# Patient Record
Sex: Male | Born: 1945 | Race: White | Hispanic: No | Marital: Married | Smoking: Never smoker
Health system: Southern US, Community
[De-identification: ages and names within clinical notes are randomized; demographics above are authoritative.]

## PROBLEM LIST (undated history)

## (undated) DIAGNOSIS — F419 Anxiety disorder, unspecified: Secondary | ICD-10-CM

## (undated) DIAGNOSIS — C439 Malignant melanoma of skin, unspecified: Secondary | ICD-10-CM

## (undated) DIAGNOSIS — M1990 Unspecified osteoarthritis, unspecified site: Secondary | ICD-10-CM

## (undated) DIAGNOSIS — E039 Hypothyroidism, unspecified: Secondary | ICD-10-CM

## (undated) DIAGNOSIS — M5136 Other intervertebral disc degeneration, lumbar region: Secondary | ICD-10-CM

## (undated) DIAGNOSIS — M51369 Other intervertebral disc degeneration, lumbar region without mention of lumbar back pain or lower extremity pain: Secondary | ICD-10-CM

## (undated) DIAGNOSIS — I10 Essential (primary) hypertension: Secondary | ICD-10-CM

## (undated) HISTORY — PX: FRACTURE SURGERY: SHX138

## (undated) HISTORY — PX: CHOLECYSTECTOMY: SHX55

## (undated) HISTORY — PX: SPINE SURGERY: SHX786

## (undated) HISTORY — DX: Anxiety disorder, unspecified: F41.9

## (undated) HISTORY — DX: Other intervertebral disc degeneration, lumbar region without mention of lumbar back pain or lower extremity pain: M51.369

## (undated) HISTORY — DX: Unspecified osteoarthritis, unspecified site: M19.90

## (undated) HISTORY — DX: Essential (primary) hypertension: I10

## (undated) HISTORY — DX: Hypothyroidism, unspecified: E03.9

## (undated) HISTORY — DX: Other intervertebral disc degeneration, lumbar region: M51.36

---
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
## 1999-03-18 ENCOUNTER — Encounter: Payer: Self-pay | Admitting: General Surgery

## 1999-03-18 ENCOUNTER — Ambulatory Visit (HOSPITAL_COMMUNITY): Admission: RE | Admit: 1999-03-18 | Discharge: 1999-03-18 | Payer: Self-pay | Admitting: General Surgery

## 1999-08-18 ENCOUNTER — Encounter: Payer: Self-pay | Admitting: General Surgery

## 1999-08-18 ENCOUNTER — Observation Stay (HOSPITAL_COMMUNITY): Admission: RE | Admit: 1999-08-18 | Discharge: 1999-08-19 | Payer: Self-pay | Admitting: General Surgery

## 1999-08-26 ENCOUNTER — Inpatient Hospital Stay (HOSPITAL_COMMUNITY): Admission: EM | Admit: 1999-08-26 | Discharge: 1999-08-28 | Payer: Self-pay

## 1999-08-29 ENCOUNTER — Encounter: Payer: Self-pay | Admitting: Internal Medicine

## 1999-08-29 ENCOUNTER — Emergency Department (HOSPITAL_COMMUNITY): Admission: EM | Admit: 1999-08-29 | Discharge: 1999-08-29 | Payer: Self-pay | Admitting: *Deleted

## 2000-01-31 ENCOUNTER — Ambulatory Visit (HOSPITAL_COMMUNITY): Admission: RE | Admit: 2000-01-31 | Discharge: 2000-01-31 | Payer: Self-pay | Admitting: Gastroenterology

## 2000-01-31 ENCOUNTER — Encounter: Payer: Self-pay | Admitting: Gastroenterology

## 2000-08-13 ENCOUNTER — Encounter: Payer: Self-pay | Admitting: Gastroenterology

## 2000-08-13 ENCOUNTER — Observation Stay (HOSPITAL_COMMUNITY): Admission: EM | Admit: 2000-08-13 | Discharge: 2000-08-14 | Payer: Self-pay | Admitting: Emergency Medicine

## 2001-11-26 ENCOUNTER — Emergency Department (HOSPITAL_COMMUNITY): Admission: EM | Admit: 2001-11-26 | Discharge: 2001-11-26 | Payer: Self-pay | Admitting: Emergency Medicine

## 2001-11-29 ENCOUNTER — Encounter: Admission: RE | Admit: 2001-11-29 | Discharge: 2001-11-29 | Payer: Self-pay | Admitting: Gastroenterology

## 2001-11-29 ENCOUNTER — Encounter: Payer: Self-pay | Admitting: Gastroenterology

## 2001-12-05 ENCOUNTER — Encounter: Payer: Self-pay | Admitting: Gastroenterology

## 2001-12-05 ENCOUNTER — Ambulatory Visit (HOSPITAL_COMMUNITY): Admission: RE | Admit: 2001-12-05 | Discharge: 2001-12-05 | Payer: Self-pay | Admitting: Gastroenterology

## 2001-12-17 ENCOUNTER — Encounter: Admission: RE | Admit: 2001-12-17 | Discharge: 2001-12-17 | Payer: Self-pay | Admitting: Gastroenterology

## 2001-12-17 ENCOUNTER — Encounter: Payer: Self-pay | Admitting: Gastroenterology

## 2001-12-24 ENCOUNTER — Encounter: Payer: Self-pay | Admitting: Gastroenterology

## 2001-12-24 ENCOUNTER — Encounter: Admission: RE | Admit: 2001-12-24 | Discharge: 2001-12-24 | Payer: Self-pay | Admitting: Gastroenterology

## 2001-12-29 ENCOUNTER — Emergency Department (HOSPITAL_COMMUNITY): Admission: EM | Admit: 2001-12-29 | Discharge: 2001-12-29 | Payer: Self-pay | Admitting: Emergency Medicine

## 2002-01-01 ENCOUNTER — Encounter: Admission: RE | Admit: 2002-01-01 | Discharge: 2002-01-01 | Payer: Self-pay | Admitting: Gastroenterology

## 2002-01-01 ENCOUNTER — Encounter: Payer: Self-pay | Admitting: Gastroenterology

## 2002-01-14 ENCOUNTER — Encounter: Admission: RE | Admit: 2002-01-14 | Discharge: 2002-01-14 | Payer: Self-pay | Admitting: Internal Medicine

## 2002-02-04 ENCOUNTER — Encounter: Admission: RE | Admit: 2002-02-04 | Discharge: 2002-02-04 | Payer: Self-pay | Admitting: Internal Medicine

## 2002-07-11 ENCOUNTER — Encounter: Admission: RE | Admit: 2002-07-11 | Discharge: 2002-10-09 | Payer: Self-pay

## 2004-11-13 ENCOUNTER — Emergency Department (HOSPITAL_COMMUNITY): Admission: EM | Admit: 2004-11-13 | Discharge: 2004-11-14 | Payer: Self-pay | Admitting: Emergency Medicine

## 2004-11-29 ENCOUNTER — Encounter: Admission: RE | Admit: 2004-11-29 | Discharge: 2004-11-29 | Payer: Self-pay | Admitting: Gastroenterology

## 2004-12-24 ENCOUNTER — Emergency Department (HOSPITAL_COMMUNITY): Admission: EM | Admit: 2004-12-24 | Discharge: 2004-12-24 | Payer: Self-pay | Admitting: Emergency Medicine

## 2004-12-26 ENCOUNTER — Inpatient Hospital Stay (HOSPITAL_COMMUNITY): Admission: AD | Admit: 2004-12-26 | Discharge: 2004-12-31 | Payer: Self-pay | Admitting: Gastroenterology

## 2005-01-20 ENCOUNTER — Ambulatory Visit: Payer: Self-pay | Admitting: Family Medicine

## 2005-01-27 ENCOUNTER — Ambulatory Visit: Payer: Self-pay | Admitting: Family Medicine

## 2005-05-04 ENCOUNTER — Encounter: Admission: RE | Admit: 2005-05-04 | Discharge: 2005-05-04 | Payer: Self-pay | Admitting: Gastroenterology

## 2005-06-29 ENCOUNTER — Emergency Department (HOSPITAL_COMMUNITY): Admission: EM | Admit: 2005-06-29 | Discharge: 2005-06-30 | Payer: Self-pay | Admitting: Emergency Medicine

## 2005-07-03 ENCOUNTER — Ambulatory Visit: Payer: Self-pay | Admitting: Gastroenterology

## 2005-07-06 ENCOUNTER — Ambulatory Visit (HOSPITAL_COMMUNITY): Admission: RE | Admit: 2005-07-06 | Discharge: 2005-07-06 | Payer: Self-pay | Admitting: Gastroenterology

## 2005-07-06 ENCOUNTER — Ambulatory Visit: Payer: Self-pay | Admitting: Gastroenterology

## 2005-07-11 ENCOUNTER — Ambulatory Visit: Payer: Self-pay | Admitting: Family Medicine

## 2005-09-13 ENCOUNTER — Ambulatory Visit: Payer: Self-pay | Admitting: Family Medicine

## 2005-11-09 ENCOUNTER — Ambulatory Visit: Payer: Self-pay | Admitting: Family Medicine

## 2006-02-11 ENCOUNTER — Encounter: Payer: Self-pay | Admitting: Gastroenterology

## 2006-08-07 ENCOUNTER — Encounter: Payer: Self-pay | Admitting: Gastroenterology

## 2006-08-07 HISTORY — PX: COLON SURGERY: SHX602

## 2006-12-28 ENCOUNTER — Telehealth: Payer: Self-pay | Admitting: Family Medicine

## 2007-01-11 ENCOUNTER — Encounter: Admission: RE | Admit: 2007-01-11 | Discharge: 2007-01-11 | Payer: Self-pay | Admitting: Orthopedic Surgery

## 2007-06-13 ENCOUNTER — Telehealth: Payer: Self-pay | Admitting: Family Medicine

## 2007-07-15 ENCOUNTER — Telehealth: Payer: Self-pay | Admitting: Family Medicine

## 2007-07-19 ENCOUNTER — Ambulatory Visit: Payer: Self-pay | Admitting: Family Medicine

## 2007-07-19 DIAGNOSIS — F329 Major depressive disorder, single episode, unspecified: Secondary | ICD-10-CM | POA: Insufficient documentation

## 2007-07-19 DIAGNOSIS — I10 Essential (primary) hypertension: Secondary | ICD-10-CM | POA: Insufficient documentation

## 2007-07-19 DIAGNOSIS — Z8719 Personal history of other diseases of the digestive system: Secondary | ICD-10-CM | POA: Insufficient documentation

## 2007-07-22 ENCOUNTER — Telehealth: Payer: Self-pay | Admitting: Family Medicine

## 2007-07-24 ENCOUNTER — Telehealth: Payer: Self-pay | Admitting: Family Medicine

## 2009-06-07 ENCOUNTER — Telehealth: Payer: Self-pay | Admitting: Gastroenterology

## 2009-06-07 ENCOUNTER — Ambulatory Visit: Payer: Self-pay | Admitting: Gastroenterology

## 2009-06-07 DIAGNOSIS — R109 Unspecified abdominal pain: Secondary | ICD-10-CM | POA: Insufficient documentation

## 2009-06-07 DIAGNOSIS — R1110 Vomiting, unspecified: Secondary | ICD-10-CM | POA: Insufficient documentation

## 2009-06-07 LAB — CONVERTED CEMR LAB
ALT: 19 units/L (ref 0–53)
AST: 14 units/L (ref 0–37)
Albumin: 4.4 g/dL (ref 3.5–5.2)
Alkaline Phosphatase: 73 units/L (ref 39–117)
BUN: 10 mg/dL (ref 6–23)
Basophils Absolute: 0 10*3/uL (ref 0.0–0.1)
Basophils Relative: 0.3 % (ref 0.0–3.0)
Bilirubin, Direct: 0.2 mg/dL (ref 0.0–0.3)
CO2: 28 meq/L (ref 19–32)
Calcium: 9.7 mg/dL (ref 8.4–10.5)
Chloride: 103 meq/L (ref 96–112)
Creatinine, Ser: 1.3 mg/dL (ref 0.4–1.5)
Eosinophils Absolute: 0 10*3/uL (ref 0.0–0.7)
Eosinophils Relative: 0.6 % (ref 0.0–5.0)
GFR calc non Af Amer: 59.43 mL/min (ref >60)
Glucose, Bld: 99 mg/dL (ref 70–99)
HCT: 45 % (ref 39.0–52.0)
Hemoglobin: 14.6 g/dL (ref 13.0–17.0)
Lymphocytes Relative: 21 % (ref 12.0–46.0)
Lymphs Abs: 1.2 10*3/uL (ref 0.7–4.0)
MCHC: 32.4 g/dL (ref 30.0–36.0)
MCV: 78.7 fL (ref 78.0–100.0)
Monocytes Absolute: 0.6 10*3/uL (ref 0.1–1.0)
Monocytes Relative: 9.9 % (ref 3.0–12.0)
Neutro Abs: 4 10*3/uL (ref 1.4–7.7)
Neutrophils Relative %: 68.2 % (ref 43.0–77.0)
Platelets: 305 10*3/uL (ref 150.0–400.0)
Potassium: 4 meq/L (ref 3.5–5.1)
RBC: 5.71 M/uL (ref 4.22–5.81)
RDW: 17.6 % — ABNORMAL HIGH (ref 11.5–14.6)
Sodium: 137 meq/L (ref 135–145)
Total Bilirubin: 1.8 mg/dL — ABNORMAL HIGH (ref 0.3–1.2)
Total Protein: 8 g/dL (ref 6.0–8.3)
WBC: 5.8 10*3/uL (ref 4.5–10.5)

## 2009-06-10 ENCOUNTER — Ambulatory Visit: Payer: Self-pay | Admitting: Cardiology

## 2009-06-11 ENCOUNTER — Encounter: Payer: Self-pay | Admitting: Gastroenterology

## 2009-06-11 DIAGNOSIS — R933 Abnormal findings on diagnostic imaging of other parts of digestive tract: Secondary | ICD-10-CM | POA: Insufficient documentation

## 2009-06-16 ENCOUNTER — Encounter: Payer: Self-pay | Admitting: Gastroenterology

## 2009-06-16 ENCOUNTER — Encounter: Payer: Self-pay | Admitting: Internal Medicine

## 2009-06-16 ENCOUNTER — Ambulatory Visit: Payer: Self-pay | Admitting: Gastroenterology

## 2009-06-16 ENCOUNTER — Telehealth: Payer: Self-pay | Admitting: Gastroenterology

## 2009-06-30 ENCOUNTER — Telehealth (INDEPENDENT_AMBULATORY_CARE_PROVIDER_SITE_OTHER): Payer: Self-pay | Admitting: *Deleted

## 2009-07-16 ENCOUNTER — Ambulatory Visit: Payer: Self-pay | Admitting: Gastroenterology

## 2009-07-16 ENCOUNTER — Encounter (INDEPENDENT_AMBULATORY_CARE_PROVIDER_SITE_OTHER): Payer: Self-pay | Admitting: *Deleted

## 2009-07-16 DIAGNOSIS — Q279 Congenital malformation of peripheral vascular system, unspecified: Secondary | ICD-10-CM | POA: Insufficient documentation

## 2009-08-12 ENCOUNTER — Ambulatory Visit: Payer: Self-pay | Admitting: Gastroenterology

## 2009-08-12 ENCOUNTER — Ambulatory Visit (HOSPITAL_COMMUNITY): Admission: RE | Admit: 2009-08-12 | Discharge: 2009-08-12 | Payer: Self-pay | Admitting: Gastroenterology

## 2009-08-30 ENCOUNTER — Encounter: Payer: Self-pay | Admitting: Gastroenterology

## 2009-08-30 ENCOUNTER — Telehealth (INDEPENDENT_AMBULATORY_CARE_PROVIDER_SITE_OTHER): Payer: Self-pay | Admitting: *Deleted

## 2009-10-06 ENCOUNTER — Telehealth (INDEPENDENT_AMBULATORY_CARE_PROVIDER_SITE_OTHER): Payer: Self-pay | Admitting: *Deleted

## 2009-10-13 ENCOUNTER — Encounter: Payer: Self-pay | Admitting: Gastroenterology

## 2010-02-02 ENCOUNTER — Encounter: Payer: Self-pay | Admitting: Gastroenterology

## 2010-05-19 NOTE — Procedures (Signed)
Summary: Upper Endoscopy  Patient: [name] Note: All result statuses are Final unless otherwise noted.  Tests: (1) Upper Endoscopy (EGD)   EGD Upper Endoscopy       DONE     [facility]     [address] [place], [place]  [address]           ENDOSCOPY PROCEDURE REPORT           PATIENT:  Henry [name], Henry [name]  MR#:  [id number]     BIRTHDATE:  1945-08-07, 64 yrs. old  GENDER:  male           ENDOSCOPIST:  Rachael Fee, MD           PROCEDURE DATE:  08/12/2009     PROCEDURE:  enteroscopy 19147     ASA CLASS:  Class II     INDICATIONS:  abnormal finding in proximal small bowel on recent     CT and SB capsule, chronic abdominal pains           MEDICATIONS:  MAC sedation, administered by CRNA     TOPICAL ANESTHETIC:  none           DESCRIPTION OF PROCEDURE:   After the risks benefits and     alternatives of the procedure were thoroughly explained, informed     consent was obtained.  The  endoscope was introduced through the     mouth and advanced to the mid jejunum, without limitations.  The     instrument was slowly withdrawn as the mucosa was fully examined.           The upper, middle, and distal third of the esophagus were     carefully inspected and no abnormalities were noted. The z-line     was well seen at the GEJ. The endoscope was pushed into the fundus     which was normal including a retroflexed view.  The antrum,gastric     body, first and second part of the duodenum were unremarkable. The     prxoimal and mid jejunum were normal. Multiple images were taken     but could not be saved due to computer/processor malfunction.     Retroflexed views revealed no abnormalities.    The scope was then     withdrawn from the patient and the procedure completed.           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  The     capsule endoscopy was also abnormal, probably at the same site as     noted on CT.  Given the possibility of neoplasm and continued,     intermittent abdominal pains we will refer him to general surgery     to consider laparoscopy to run the small bowel.           ______________________________     Rachael Fee, MD           cc: Marga Melnick, MD           n.  eSIGNED:   Rachael Fee at 08/12/2009 12:58 PM           Henry [name], [id number]  Note: An exclamation mark (!) indicates a result that was not dispersed into the flowsheet. Document Creation Date: 08/12/2009 12:59 PM _______________________________________________________________________  (1) Order result status: Final Collection or observation date-time: 08/12/2009 12:49 Requested date-time:  Receipt date-time:  Reported date-time:  Referring Physician:   Ordering Physician: Rob Bunting [phone]) Specimen Source:  Source: Launa Grill Order Number: [id number] Lab site:   Appended Document: Upper Endoscopy patty, he needs referral to general surgery to consider laprascopy to look for small bowel lesion noted on recent CT, small bowel capsule.  Appended Document: Orders Update/[facility] records faxed to [facility] pt aware   Clinical Lists Changes  Orders: Added new Test order of [facility] ([facility]) - Signed      Appended Document: Upper Endoscopy pt is aware of the appt

## 2010-05-19 NOTE — Progress Notes (Signed)
Summary: Triage   Phone Note Call from Patient Call back at Home Phone [phone]   Caller: Patient Call For: Dr. Christella Hartigan Reason for Call: Talk to Nurse Summary of Call: Pt did as he was instructed and ate a little lunch and had a BM and the capsule came out Initial call taken by: Karna Christmas,  June 16, 2009 1:19 PM  Follow-up for Phone Call        Pt had lunch at  noon and  had a BM around 1:15.  He said it wasn't stool but he thinks it looked like bile. H e passed his capsule around 1:15PM.  I advised him to come back to the office.  Follow-up by: Joselyn Glassman,  June 16, 2009 2:52 PM  Additional Follow-up for Phone Call Additional follow up Details #1::        Pt came back to the office and returned the equipment. I advised him we will contact in in 2-3 weeks with the results. Additional Follow-up by: Joselyn Glassman,  June 16, 2009 3:54 PM

## 2010-05-19 NOTE — Assessment & Plan Note (Signed)
Review of gastrointestinal problems: 1. Perforated sigmoid diverticulitis, 2007. Treated with Hartman's pouch, temporary colostomy that was eventually taken down. 2. gallstone disease, status post early 2000 cholecystectomy and then followup ERCP for retained bile duct stone. 3. likely IBS, alternating bowel habits 4. abnormal finding proximal small bowel on CT scan 2011, confirmed on capsule endoscopy 2011 unclear etiology.    History of Present Illness Visit Type: Follow-up Visit Primary GI MD: Rob Bunting MD Primary Provider: Marga Melnick, MD  Requesting Provider: n/a Chief Complaint: discuss capsule endo  History of Present Illness:     65 year old man who was here last 1 month ago, he feels better "a little bit."  He was still very fatigued, weak and had pains in abdomen.  HE will alternate with diarrhea/constipation.    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  He tells me that depression is an issue for him.  No vomitting, has a decent appetite.  he had a CT scan and a capsule endoscopy, see those results above.           Current Medications (verified): 1)  Benazepril-Hydrochlorothiazide 10-12.5 Mg Tabs (Benazepril-Hydrochlorothiazide) .... Once Daily 2)  Vitamin B-12 1000 Mcg  Tabs (Cyanocobalamin) .... Take 1 Tablet By Mouth Once A Day 3)  Ambien 5 Mg  Tabs (Zolpidem Tartrate) .... Take 1 Tablet By Mouth At Bedtime--Needs Appt 4)  Adult Aspirin Ec Low Strength 81 Mg  Tbec (Aspirin) .... Once Daily 5)  Fish Oil Maximum Strength 1200 Mg  Caps (Omega-3 Fatty Acids) .... Once Daily 6)  Flomax 0.4 Mg Caps (Tamsulosin Hcl) .... Once Daily 7)  Lorazepam 1 Mg Tabs (Lorazepam) .... As Needed  Allergies (verified): 1)  ! Cipro  Vital Signs:  Patient profile:   65 year old male Height:      66 inches Weight:      195 pounds BMI:     31.59 BSA:     1.98 Pulse rate:   72 / minute Pulse  rhythm:   regular BP sitting:   136 / 84  (left arm) Cuff size:   regular  Vitals Entered By: Ok Anis CMA (July 16, 2009 2:36 PM)  Physical Exam  Additional Exam:  Constitutional: generally well appearing Psychiatric: alert and oriented times 3 Abdomen: soft, non-tender, non-distended, normal bowel sounds    Impression & Recommendations:  Problem # 1:  Alternating constipation, loose stools this is a very irritable bowel like symptom. I recommended he try Citrucel fiber supplements on a daily basis to try to help regulate his bowels bit more.  Problem # 2:  abnormal area on CT scan, capsule endoscopy not clear what this represents however it may be reachable by enteroscopy and so we will arrange for enteroscopy to be performed with propofol sedation at [facility] his soonest convenience.  Patient Instructions: 1)  You will be scheduled to have an enteroscopy at [facility] with propofol, the goal it to find the area noted on capsule.  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 2)  You should begin taking citrucel powder fiber supplement (orange flavor).  Start with a small spoonful and increase this over 1 week to a full, heaping spoonful daily.  You may notice some bloating when you first start the fiber, but that usually resolves after a few days. 3)  The medication list was reviewed and reconciled.  All changed / newly prescribed medications were explained.  A complete medication list was provided to the patient / caregiver.  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

## 2010-05-19 NOTE — Procedures (Signed)
Summary: 211.2/152.9  Patient here today for capsule endoscopy for Dr.Peggye Poon .  Pt verbalized understanding of all verbal and written instructions.  Pt tolerated well.  Lot #  [id number]  exp 2012-05 .  Appended Document: 211.2/152.9 capsule endo read by DR. 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 area of ulceration there, otherwise negative.  please call him, he needs rov with me in next few weeks to discuss results (SB diveriticulum causing problems?, this is possible,)   Appended Document: 211.2/152.9 pt aware appt scheduled for 07/16/09

## 2010-05-19 NOTE — Op Note (Signed)
Summary: Diverticulitis/[facility]  Diverticulitis/[facility]   Imported By: Lester Tazewell 06/24/2009 07:34:25  _____________________________________________________________________  External Attachment:    Type:   Image     Comment:   External Document

## 2010-05-19 NOTE — Progress Notes (Signed)
Summary: [facility] referral  ---- Converted from flag ---- ---- 08/30/2009 1:14 PM, Rachael Fee MD wrote: He needs referral to [facility] to consider double balloon enteroscopy for abnormal finding on CT scan, capsule endoscopy. ------------------------------  Appended Document: Orders Update/[facility]    Clinical Lists Changes  Orders: Added new Test order of [facility] - Signed

## 2010-05-19 NOTE — Progress Notes (Signed)
Summary: results questions    Phone Note Call from Patient Call back at Home Phone [phone]   Caller: Patient Call For: Dr. Christella Hartigan Reason for Call: Talk to Nurse Summary of Call: pt has questions regarding the results of "a test" pt said he had last week Initial call taken by: Vallarie Mare,  June 30, 2009 9:29 AM  Follow-up for Phone Call        answered all of pt questions and he will call with anyfurther concerns  Follow-up by: Chales Abrahams CMA Duncan Dull),  June 30, 2009 10:23 AM

## 2010-05-19 NOTE — Consult Note (Signed)
Summary: Gastroenterology/[facility]   Gastroenterology/[facility]   Imported By: Sherian Rein 11/01/2009 14:31:00  _____________________________________________________________________  External Attachment:    Type:   Image     Comment:   External Document

## 2010-05-19 NOTE — Procedures (Signed)
Summary: Instructions for procedure/[facility]  Instructions for procedure/ [facility]   Imported By: Sherian Rein 06/16/2009 08:54:15  _____________________________________________________________________  External Attachment:    Type:   Image     Comment:   External Document

## 2010-05-19 NOTE — Letter (Signed)
Summary: Gastroenterology/[facility]  Gastroenterology/[facility]   Imported By: Lester Ke[place]burg 02/14/2010 10:49:42  _____________________________________________________________________  External Attachment:    Type:   Image     Comment:   External Document

## 2010-05-19 NOTE — Procedures (Signed)
Summary: Instruction for procedure/[facility] (out pt)  Instruction for procedure/[facility] (out pt)   Imported By: Sherian Rein 07/23/2009 14:29:22  _____________________________________________________________________  External Attachment:    Type:   Image     Comment:   External Document

## 2010-05-19 NOTE — Assessment & Plan Note (Signed)
History of Present Illness Visit Type: new patient  Primary GI MD: Rob Bunting MD Primary Provider: Marga Melnick, MD  Requesting Provider: n/a Chief Complaint: abd pain, and vomiting History of Present Illness:     65 year old [name] who is here with his wife today. He has seen 4 or 5 different gastroenterologist in the community in the past 10 years.  was seeing Dr. Claudette Head, eventually found to have gallstones in 2001, he underwent cholecystectomy, this was followed by ERCP 6 days later for retained stones.  Since then he has had yearly admissions to the hospital.  he eventually chamfered care to Dr. Leary Roca who worked him up for abdominal pains  Was sent to [facility], Dr. Justin Mend, sent by Dr. Vida Rigger. He could not find anything cuasing his symptoms :which are same as now: faitgue, tired. low temperature, vomitting.  He called our office for emergency visits.  He has had abdominal pains.  These are in several locations.  He having flatus and moving bowels.  He has decreased to liquid diet.  He has problems like this for 10 years.  He suffered severe diveritulsosi, had emergency surgery.  Colostomy placed. Colostomy was reversed April 2008.  He was told by Dr. Serafina Royals that the patient may have suffered some type of damage during his cholecystectomy several years earlier.    she had a CBC, complete metabolic profile done earlier today and these were both essentially normal.           Current Medications (verified): 1)  Benazepril-Hydrochlorothiazide 10-12.5 Mg Tabs (Benazepril-Hydrochlorothiazide) .... Once Daily 2)  Vitamin B-12 1000 Mcg  Tabs (Cyanocobalamin) .... Take 1 Tablet By Mouth Once A Day 3)  Ambien 5 Mg  Tabs (Zolpidem Tartrate) .... Take 1 Tablet By Mouth At Bedtime--Needs Appt 4)  Adult Aspirin Ec Low Strength 81 Mg  Tbec (Aspirin) .... Once Daily 5)  Fish Oil Maximum Strength 1200 Mg  Caps (Omega-3 Fatty Acids) .... Once Daily 6)  Flomax 0.4 Mg Caps  (Tamsulosin Hcl) .... Once Daily  Allergies (verified): 1)  ! Cipro  Past History:  Past Medical History: Depression Diverticulitis, hx of Hypertension partial colectomy 2007 for ruptured diverticuli  cholecystectomy 2001, followed by ERCP by Dr. Yancey Flemings  Family History: Family History of Alcoholism/Addiction Family History of Arthritis Family History Hypertension   Social History: Married Never Smoked Alcohol use-yes   Review of Systems       Pertinent positive and negative review of systems were noted in the above HPI and GI specific review of systems.  All other review of systems was otherwise negative.   Vital Signs:  Patient profile:   65 year old male Height:      66 inches Weight:      189 pounds BMI:     30.62 BSA:     1.95 Temp:     97.7 degrees F oral Pulse rate:   Henry / minute Pulse rhythm:   regular BP sitting:   132 / Henry  (left arm) Cuff size:   regular  Vitals Entered By: Ok Anis CMA (June 07, 2009 3:19 PM)  Physical Exam  Additional Exam:  Constitutional: generally well appearing Psychiatric: alert and oriented times 3 Eyes: extraocular movements intact Mouth: oropharynx moist, no lesions Neck: supple, no lymphadenopathy Cardiovascular: heart regular rate and rythm Lungs: CTA bilaterally Abdomen: Long, wide midline incision well-healedsoft, mildly tender throughout abdomen, non-distended, no obvious ascites, no peritoneal signs, normal bowel sounds Extremities: no  lower extremity edema bilaterally Skin: no lesions on visible extremities    Impression & Recommendations:  Problem # 1:  long history of GI symptoms, acute abdominal pain, vomiting currently he has abdominal pain, vomiting. He did not clinically seem to have a bowel obstruction which certainly he is at risk for after his multiple abdominal surgeries. He tells me he has had complicated diverticulitis in the past and perhaps he has another bout of diverticulitis now. His  white count is normal and so that seems less likely. I would like to get surgical records from [facility] sent over. He we'll see a CAT scan with IV and oral contrast of his abdomen and pelvis. She has seen many GI providers in the area and has been dissatisfied with all of them. He was asking about empiric antibiotics currently I do not think that is a good idea for a did not know what we would be treating and givin a normal white count I think it is less likely he has any kind of serious infections.  Patient Instructions: 1)  You will be scheduled for a CT scan of abdomen and pelvis with IV and oral contrast. 2)  A copy of this information will be sent to Dr. Janace Hoard. 3)  We will get copies of surgery reports from [facility] doctor (Dr. Serafina Royals). 4)  The medication list was reviewed and reconciled.  All changed / newly prescribed medications were explained.  A complete medication list was provided to the patient / caregiver.  Appended Document: Orders Update/CT    Clinical Lists Changes  Problems: Added new problem of ABDOMINAL PAIN OTHER SPECIFIED SITE (ICD-789.09) Added new problem of VOMITING ALONE (ICD-787.03) Orders: Added new Referral order of CT Abdomen/Pelvis with Contrast (CT Abd/Pelvis w/con) - Signed

## 2010-05-19 NOTE — Letter (Signed)
Summary: EGD Instructions  [facility]  [address] [place], [place] [address]   Phone: [phone]  Fax: [phone]       VARTAN KERINS    08-16-1945    MRN: [id number]       Procedure Day /Date:08/12/09     Arrival Time: 1045 am     Procedure Time:1245 pm     Location of Procedure:                     [facility] ( Outpatient Registration)    PREPARATION FOR ENDOSCOPY   On 08/12/09  THE DAY OF THE PROCEDURE:  1.   Nothing to eat or drink is allowed after midnight the night before your procedure.     MEDICATION INSTRUCTIONS  Unless otherwise instructed, you should take regular prescription medications with a small sip of water as early as possible the morning of your procedure.            OTHER INSTRUCTIONS  You will need a responsible adult at least 63 years of age to accompany you and drive you home.   This person must remain in the waiting room during your procedure.  Wear loose fitting clothing that is easily removed.  Leave jewelry and other valuables at home.  However, you may wish to bring a book to read or an iPod/MP3 player to listen to music as you wait for your procedure to start.  Remove all body piercing jewelry and leave at home.  Total time from sign-in until discharge is approximately 2-3 hours.  You should go home directly after your procedure and rest.  You can resume normal activities the day after your procedure.  The day of your procedure you should not:   Drive   Make legal decisions   Operate machinery   Drink alcohol   Return to work  You will receive specific instructions about eating, activities and medications before you leave.    The above instructions have been reviewed and explained to me by   _______________________    I fully understand and can verbalize these instructions _____________________________ Date _________

## 2010-05-19 NOTE — Miscellaneous (Signed)
Summary: Capsule ENDO order  Clinical Lists Changes  Problems: Added new problem of NONSPECIFIC ABN FINDING RAD & OTH EXAM GI TRACT (ICD-793.4) Orders: Added new Test order of Capsule Endoscopy (Capsule Endoscopy) - Signed

## 2010-05-19 NOTE — Procedures (Signed)
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  3) Abnormal edematous fold and ? diverticulum approximately 30 minutes beyond first duodenal image, with ? area of ulceration.  4) otherwise negative study  Summary and Recommendations:  Per Dr Christella Hartigan.  Abnormal bowel may be reachable with push enteroscopy.  This report was created from the original report, which was reviewed and signed by the above listed reading physician.

## 2010-05-19 NOTE — Op Note (Signed)
Summary: Colostomy/[facility]  Colostomy/[facility]   Imported By: Lester Elwood 06/24/2009 07:31:07  _____________________________________________________________________  External Attachment:    Type:   Image     Comment:   External Document

## 2010-05-19 NOTE — Progress Notes (Signed)
Summary: triage   Phone Note Call from Patient Call back at Home Phone [phone]   Caller: Patient Call For: Dr. Christella Hartigan Reason for Call: Talk to Nurse Summary of Call: pt would like to be seen asap for fever, nausea, vomiting... offered this Friday with Dr. Christella Hartigan, but pt said he wants sooner Initial call taken by: Vallarie Mare,  June 07, 2009 9:43 AM  Follow-up for Phone Call          message left for pt. to call back   Teryl Lucy RN  June 07, 2009 10:32 AM   Additional Follow-up for Phone Call Additional follow up Details #1::         Pt. became symptomatic late Thursday evening with peri-umbilical pain which is intermittent but more prominent in the mornings along with vomiting. Has been on liquids and applesauce. Temp. is sub-normal at 95- 96. Says he feels quite ill. Had colon surgergy 3 yrs. ago for diverticulosis. requesting to be seen today. Additional Follow-up by: Teryl Lucy RN,  June 07, 2009 10:49 AM    Additional Follow-up for Phone Call Additional follow up Details #2::    needs cbc, cmet and NGI with me this afternoon (I haven't seen him in 4 years)  Follow-up by: Rachael Fee MD,  June 07, 2009 11:07 AM  Additional Follow-up for Phone Call Additional follow up Details #3:: Details for Additional Follow-up Action Taken: Pt. ntfd.- .will come for labs and return for appt. at 3:30 pm. Additional Follow-up by: Teryl Lucy RN,  June 07, 2009 11:23 AM

## 2010-05-19 NOTE — Procedures (Signed)
Summary: EUS   EUS  Procedure date:  07/06/2005  Findings:      Location: [facility]   Patient Name: Henry [name], Henry [name] MRN:  Procedure Procedures: Panendoscopy with EUSCPT: 43259.  Personnel: Endoscopist: Rachael Fee, MD.  Exam Location: Exam performed in Endoscopy Suite. Outpatient  Patient Consent: Procedure, Alternatives, Risks and Benefits discussed, consent obtained, from patient. Consent was obtained by the RN.  Indications  Assessment: chronic RUQ pain since cholecystectomy 2001, s/p ERCP 2001  with sphincterotomy and removal of retained CBD stones.  History  Current Medications: Patient is not currently taking Coumadin.  Pre-Exam Physical: Performed Jul 06, 2005. Cardio-pulmonary exam, Abdominal exam, Mental status exam WNL.  Comments: Pt. history reviewed/updated, physical exam performed prior to initiation of sedation? yes Exam Exam: Images were taken.  Patient: ASA Classification: II. Tolerance: good.  Sedation Meds:  ~OBJECTIVE5Sedation Meds Patient assessed and found to be appropriate for moderate (conscious) sedation. Fentanyl 100 mcg. given IV. Versed 8 mg. given IV. Cetacaine Spray 2 sprays given aerosolized.  Monitoring: BP and pulse monitoring done. Oximetry was used. Supplemental O2 given.  EUS Scopes: Radial Echoendoscope used   Comments: Endoscopic examination: 1. Normal esophagus. 2. Normal stomach. 3. Normal duodenum, normal major papilla s/p 2001 sphincterotomy.  EUS examination: 1. CBD was normal: 4.72mm diameter, no filling defects within. 2. Pancreatic parenchyma was normal in head, neck, body and tail. 3. Gallbladder surgically absent. 4. Limited views of splenic vessels, portal vein, celiac trunk, liver were all normal. 5. No peripancreatic or celiac adenopathy. Assessment  Biliary/Pancreatic Normal examination.  Comments: His chronic RUQ pain since 2001 cholecystectomy may be from adhesions or  neuropathic, no sign of retained CBD stones on this examination. Events  Unplanned Intervention: No intervention was required.  Unplanned Events: There were no complications. Plans Comments: I will discuss findings with Dr. Ewing Schlein. This report was created from the original endoscopy report, which was reviewed and signed by the above listed endoscopist.

## 2010-05-19 NOTE — Procedures (Signed)
Summary: Capsule Endoscopy/[facility]  Capsule Endoscopy/[facility]   Imported By: Sherian Rein 07/02/2009 09:56:26  _____________________________________________________________________  External Attachment:    Type:   Image     Comment:   External Document

## 2010-05-19 NOTE — Letter (Signed)
Summary: [facility]   Imported By: Sherian Rein 09/14/2009 08:36:28  _____________________________________________________________________  External Attachment:    Type:   Image     Comment:   External Document

## 2010-05-19 NOTE — Progress Notes (Signed)
Summary: ENDO CAPSULE DISC   Phone Note Outgoing Call   Summary of Call: pt walked in the office requesting a copy of his endo capsule on a disc to be sent to [facility] Dr Gwinda Passe.  I have asked Darcey Nora RN to burn a copy for me. Initial call taken by: Chales Abrahams CMA Duncan Dull),  October 06, 2009 1:59 PM  Follow-up for Phone Call        capsule disk on your desk Follow-up by: Darcey Nora RN, CGRN,  October 06, 2009 2:51 PM  Additional Follow-up for Phone Call Additional follow up Details #1::        called to have pt pick up the disc to take to the appt at [facility].  no answer Chales Abrahams CMA Duncan Dull)  October 06, 2009 2:58 PM   left message on machine to call back Chales Abrahams CMA Duncan Dull)  October 07, 2009 10:25 AM     Additional Follow-up for Phone Call Additional follow up Details #2::    spoke with the pt's wife they pick the disc up today or monday.  Will be at the front desk Follow-up by: Chales Abrahams CMA Duncan Dull),  October 08, 2009 1:27 PM

## 2010-08-22 ENCOUNTER — Other Ambulatory Visit: Payer: Self-pay | Admitting: Specialist

## 2010-08-22 DIAGNOSIS — R531 Weakness: Secondary | ICD-10-CM

## 2010-08-22 DIAGNOSIS — M79602 Pain in left arm: Secondary | ICD-10-CM

## 2010-09-02 NOTE — Procedures (Signed)
[facility]  Patient:    Henry [name], Henry [name]                    MRN: [id number] Proc. Date: 08/27/99 Adm. Date:  [id number] Disc. Date: [id number] Attending:  Harrel Carina CC:         Zigmund Daniel, M.D.             Timothy E. Earlene Plater, M.D.             Venita Lick. Pleas Koch., M.D. [facility]             Evette Georges, M.D. [facility]                           Procedure Report  PROCEDURE:  Endoscopic retrograde cholangiopancreatography with biliary sphincterotomy and common duct stone extraction.  INDICATION:  Abdominal pain and elevated liver tests post laparoscopic cholecystectomy.  HISTORY:  This is a 65 year old white male with chronic abdominal and back complaints, who underwent laparoscopic cholecystectomy with intraoperative cholangiogram Aug 18, 1999.  He was found to have chronic calculous cholecystitis.  Intraoperative cholangiogram noted small filling defects felt to be air.  The patient was discharged home.  Since that time he has had back and abdominal discomfort.  As well, dark urine.  He was evaluated yesterday by Dr. Orson Slick.  He had markedly abnormal liver function tests compared to normal preoperative liver function tests.  He is on antibiotics now for ERCP with possible sphincterotomy and common duct stone extraction.  The nature of this procedure, as well as its risks, benefits, and alternatives were discussed in great detail.  He understood and agreed to proceed.  PHYSICAL EXAMINATION:  A very anxious but otherwise well-appearing male in no acute distress.  He is alert and oriented.  His vital signs are stable.  Lungs are clear.  Heart is regular.  Abdomen is soft with complaints of tenderness to minimal palpation on the right.  DESCRIPTION OF PROCEDURE:  After informed consent was obtained, the patient was sedated with 100 mg of Demerol and 10 mg of Versed IV.  The patient had been receiving IV ciprofloxacin.  Several hours earlier, he  was given IV Unasyn.  He seemed to have a local reaction to ciprofloxacin.  In any event, the Olympus side-viewing endoscope was passed blindly into the esophagus.  The stomach revealed diffuse gastritis with no ulceration.  The duodenal bulb and postbulbar duodenum were normal.  The major ampulla was normal.  The minor ampulla was not sought.  X-RAY FINDINGS: 1. Scout radiograph of the abdomen with the endoscope in position revealed    surgical clips. 2. Limited filling of the pancreatic duct revealed no abnormalities. 3. Complete filling of the biliary tree revealed mild dilation.  The distal    common duct was approximately 9 mm.  Two small filling defects were noted    in the region of the bifurcation.  These measured 5 and 2 mm, respectively.    No other abnormalities.  THERAPY:  Over a guidewire, a large biliary sphincterotomy was made by cutting at the 12 oclock orientation.  The cutting catheter was then exchanged for a 12 mm balloon.  Two pigmented stones were extracted with the balloon. Post-extraction occlusion cholangiogram revealed no residual filling defects. Drainage was excellent.  IMPRESSION:  Choledocholithiasis post laparoscopic cholecystectomy, status post endoscopic retrograde cholangiopancreatography with sphincterotomy and stone extraction.  RECOMMENDATIONS: 1. Observe post procedure.  2. Advance diet as tolerated. 3. List ciprofloxacin as allergy. 4. Possible discharge in a.m. DD:  08/27/99 TD:  08/30/99 Job: [id number]

## 2010-09-02 NOTE — Discharge Summary (Signed)
NAME:  Henry [name], Henry [name] NO.:  [id number]   MEDICAL RECORD NO.:  [id number]          PATIENT TYPE:  INP   LOCATION:  5715                         FACILITY:  [facility]   PHYSICIAN:  Althea Grimmer. Santogade, M.D.DATE OF BIRTH:  [birth year]   DATE OF ADMISSION:  12/26/2004  DATE OF DISCHARGE:  12/31/2004                                 DISCHARGE SUMMARY   DISCHARGE DIAGNOSES:  1.  Diverticulitis of the sigmoid colon.  2.  History of multiple orthopedic problems.  3.  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  4.  Hypertension   HISTORY OF PRESENT ILLNESS:  The patient presented with lower abdominal  pain, fevers and chills. He went to the emergency room, where his white  blood count was 18 and a CT scan showed sigmoid diverticulitis. He was  discharged on sulfa and Flagyl for home care, but continued to feel ill and  began vomiting, possibly secondary to the Flagyl and hydrocodone that he was  given.  He was admitted for intravenous antibiotic therapy.   PHYSICAL EXAMINATION ON ADMISSION:  He was afebrile.  Pertinent findings  included a tender abdomen with periumbilical guarding.   HOSPITAL COURSE:  PROBLEM #1 -  ACUTE DIVERTICULITIS. The patient was begun  on IV Unasyn.  He continued to have significant discomfort and nausea, with  abdominal pain localized mostly to the right lower quadrant; however, he had  no further fevers.  Abdominal x-ray on September 11 was normal.  On the 15th  the Unasyn was discontinued and he was placed on Bactrim and Flagyl orally.  His diet was advanced; he did well and was tolerating a full but low residue  diet on the 16th without abdominal pain.   DISCHARGE LABORATORY DATA:  White blood count 10.6 hemoglobin 12.2, platelet  count normal.  Potassium was 3.0; however, he received oral potassium after  this measurement.  Liver function tests were completely normal. Albumin 2.7,  amylase 22. Urinalysis  negative.   DISCHARGE MEDICATIONS:  1.  Neurontin 300 mg four times daily  2.  Benazepril/hydrochlorothiazide 10/25 mg daily.  3.  Bactrim DS one b.i.d. for five additional days.  4.  Flagyl 250 mg t.i.d. after meals for five additional days.  5.  Ultracet one q.i.d. p.r.n.  6.  Ambien 5 mg h.s. p.r.n.   FOLLOW UP:  Petra Kuba, M.D. in 10-14 days.   DISCHARGE CONDITION:  Improved.      Althea Grimmer. Luther Parody, M.D.  Electronically Signed     PJS/MEDQ  D:  12/31/2004  T:  01/01/2005  Job:  [id number]

## 2010-09-02 NOTE — H&P (Signed)
[facility]  Patient:    Henry [name], Henry [name]                    MRN: [id number] Adm. Date:  [id number] Attending:  Nelda Marseille CC:         Evette Georges, M.D. [facility]  Petra Kuba, M.D.   History and Physical  CHIEF COMPLAINT: Abdominal pain, nausea, vomiting, and fever.  HISTORY OF PRESENT ILLNESS: The patient is a 65 year old white male, who was well until May 2001 at which time he had laparoscopic cholecystectomy by Dr. Lorelee New and apparently had a common duct stone.  He had ERCP at that time with sphincterotomy and removal of pigment stones by Dr. Yancey Flemings, and apparently has had continued pain since then.  He has transferred his care to Dr. Ewing Schlein and has been seen several times by Dr. Ewing Schlein and, although the records are not currently available, has had a CT scan sometime in the last year due to chronic right-sided pain.  He states he has learned to live with this pain and had not been any better or worse ever since May of last year, and he just tolerates it.  This morning he woke up with fever, was sweaty and vomiting.  He has vomited four times during the day.  He has not had much urine output.  He has not had any hematemesis.  This has been bilious vomiting.  His pain has gotten much more severe.  He normally rates it on a scale of 1/10 and now it is 8-9/10.  The pain is located in the right upper quadrant and goes through to the back, and comes in waves associated with vomiting.  He states this is exactly like his gallbladder attacks.  He had a temperature of 101.2 degrees today and took some Advil at approximately 3 p.m., and has felt better.  He has not had any further chills or fever since then, still nauseated and most of his pain is still persistent.  For these reasons he came to the emergency room.  CURRENT MEDICATIONS:  1. Lotensin.  2. HCT 20/25 mg one q.d.  3. Darvocet p.r.n.  4. Ambien one dose  q.h.s.  ALLERGIES: No known drug allergies.  PAST MEDICAL HISTORY: Mild hypertension.  No other chronic medical problems.  PAST SURGICAL HISTORY:  1. Laparoscopic cholecystectomy with subsequent ERCP, sphincterotomy, and     stone extraction in May 2001.  2. History of shoulder and back surgery.  FAMILY HISTORY: Negative for GI cancer.  SOCIAL HISTORY: Denies drinking.  PHYSICAL EXAMINATION:  GENERAL: The patient is in no obvious distress.  HEENT: Sclerae nonicteric.  EOMI.  NECK: Supple.  No lymphadenopathy.  LUNGS: Clear.  HEART: Regular rate and rhythm without murmurs or gallops.  ABDOMEN: Generally soft, silent, marked tenderness in right upper quadrant and epigastrium.  The patient is not having any guarding but is quite tender in this area.  There is no flank tenderness that I can appreciate.  Bowel sounds are present but diminished.  LABORATORY DATA: Acute abdominal series is negative for free air or bowel obstruction.  He has slight rise in bilirubin to 1.7, which is up from his normal value. All other liver tests and WBC are normal.  ASSESSMENT: Nausea, vomiting, and abdominal pain - this could be due to multitudes of things including simply a viral syndrome.  He is not really having any watery diarrhea, severe cramping, or hyperactivity.  Fever  is worrisome, particularly when the symptoms are very similar to previous common duct blockage.  PLAN: Will admit for evaluation.  Will culture if he spikes temperature and will hold on antibiotics for now.  If he is better in the morning and liver tests have been changed he possibly could be discharged. DD:  08/13/00 TD:  08/14/00 Job: [id number]

## 2010-09-02 NOTE — H&P (Signed)
NAME:  Henry [name], Henry [name] NO.:  [id number]   MEDICAL RECORD NO.:  [id number]          PATIENT TYPE:  INP   LOCATION:  5707                         FACILITY:  [facility]   PHYSICIAN:  Petra Kuba, M.D.    DATE OF BIRTH:  [birth year]   DATE OF ADMISSION:  12/26/2004  DATE OF DISCHARGE:                                HISTORY & PHYSICAL   HISTORY:  The patient called me Saturday with a different pain than his  usual chronic pain, mostly lower with some fever and chills. Since it was  different I thought it was best to go to the emergency room. I did not hear  back until today, he showed up in our office. We reviewed his ER data which  included a white count of 18 and a CAT scan showing diverticulitis. He was  given sulfa and Flagyl, but continued to have alternating fever, chills, and  nightsweats. The pain is still there and he has been unable to keep anything  down. This is different than his chronic pain. He had been fine until this  started last week. He does eat some popcorn, but really watches his diet and  takes his Metamucil. No other specific new complaints.   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 He has also had a  sphincterotomy in the past. He also has back surgery and shoulder surgery as  well as high blood pressure.   FAMILY HISTORY:  Negative for any obvious GI problem.   SOCIAL HISTORY:  Drinks rarely. Does not smoke. Minimizes over-the-counter  medication use.   CURRENT MEDICATIONS:  Benazepril and Neurontin only with some rare fish oil,  B12, and a baby aspirin. He also takes some glucosamine.   ALLERGIES:  CIPRO, OXYCODONE, and OXYCONTIN.   REVIEW OF SYSTEMS:  Pertinent for some change in his urinating habits with  some decreased urination, but no other specific complaints.   PHYSICAL EXAMINATION:  VITAL SIGNS: Weight 185. Vital signs stable. He is  afebrile  today.  HEENT: Sclerae nonicteric.  NECK: Supple without obvious adenopathy.  LUNGS: Clear.  HEART: Regular rate and rhythm.  ABDOMEN: His abdomen is sore. He does have some periumbilical guarding, but  no rebound. Rare bowel sounds. Decreased peripheral pulses. No pedal edema.   ASSESSMENT:  1.  Diverticulitis based on CAT scan two days ago.  2.  History of back fusion, shoulder surgery, and shoulder problems.  3.  History of cholecystectomy, common bile duct stones, and sphincterotomy      in the past. Questionably post cholecystectomy syndrome.  4.  High blood pressure.   PLAN:  Repeat labs and KUB. Consider surgical consult, IV antibiotics, pain  medications, and bowel rest. Dr. Luther Parody to take care of him while an  inpatient and I will notify him of the admission.           ______________________________  Petra Kuba, M.D.     MEM/MEDQ  D:  12/26/2004  T:  12/26/2004  Job:  [id number]   cc:  Jeffrey A. Tawanna Cooler, M.D. [facility]  [address] [place]  [place] [address]

## 2010-09-02 NOTE — H&P (Signed)
[facility]  Patient:    Henry [name], Henry [name]                    MRN: [id number] Adm. Date:  [id number] Disc. Date: [id number] Attending:  Carson Myrtle                         History and Physical  CHIEF COMPLAINT:  Abdominal pain.  PRESENT ILLNESS:  The patient is a 65 year old white male who is eight days status post laparoscopic cholecystectomy done because of chronic right-sided abdominal pain and finding of cholelithiasis on ultrasound.  Patient had also been felt to have some back pain, perhaps of radicular nature.  He had been seen by Dr. Kerrin Champagne for that and treated with clonazepam 0.5 mg at bedtime.  He is also on atenolol 25 mg daily for high blood pressure and hydrochlorothiazide 25 mg daily as well.  He had been taking Darvocet-N for pain.  Since his operation, he had been having repeat "attacks" of pain, very similar to the pain he had before his gallbladder surgery.  I saw him in the office on the afternoon of admission and found him to be in quite a bit of distress and obtained tests which indicated possible choledocholithiasis, because of elevated bilirubin of 2.6 and elevated liver enzymes.  He is admitted to the hospital for pain control, IV antibiotics, consultation with Dr. Wilhemina Bonito. Eda Keys. for possible ERCP and stone extraction.  Patient has not had fever or chills.  The WBC was normal on CBC and urinalysis was normal except for increased urobilinogen.  Patient had normal liver tests prior to his surgery.  At the time of surgery, a cholangiogram was done which showed slightly dilated bile ducts and slow emptying, with possible distal spasm; no definite stones were seen, however.  He did have small stones in the gallbladder.  PAST MEDICAL HISTORY:  Chronic illnesses as mentioned above.  He says he is generally healthy.  MEDICATIONS:  No other medications.  ALLERGIES:  No allergies.  SOCIAL HISTORY:  He does not smoke  or drink at all.  FAMILY HISTORY:  Childhood illnesses.  REVIEW OF SYSTEMS:  Unremarkable.  PHYSICAL EXAMINATION:  GENERAL:  Patient is in a little bit of pain.  Mental status normal.  VITAL SIGNS:  Normal as recorded by nurse.  HEENT/NECK:  Unremarkable.  I do not detect any jaundice or scleral icterus.  CHEST:  Clear to auscultation.  HEART:  Rate and rhythm normal.  No murmur or gallop.  ABDOMEN:  Very slight epigastric and right upper quadrant tenderness.  Slight bilateral CVA tenderness.  No mass or organomegaly.  There is a small organizing hematoma at one of the lateral laparoscopic incisions, otherwise, unremarkable.  No hernia.  GU:  Genitalia normal.  EXTREMITIES:  Normal.  NEUROLOGIC:  Normal.  IMPRESSION: 1. Probable choledocholithiasis. 2. Hypertension.  PLAN:  As above. DD:  08/27/99 TD:  08/27/99 Job: [id number]

## 2010-09-02 NOTE — Op Note (Signed)
[facility]  Patient:    Henry [name], Henry [name]                    MRN: [id number] Adm. Date:  [id number] Disc. Date: [id number] Attending:  Carson Myrtle CC:         Venita Lick. Pleas Koch., M.D. [facility]                           Operative Report  PREOPERATIVE DIAGNOSIS:  Chronic cholecystolithiasis.  POSTOPERATIVE DIAGNOSIS:  Chronic cholecystolithiasis.  PROCEDURES: 1. Laparoscopic cholecystectomy. 2. Operative cholangiogram.  SURGEON:  Timothy E. Earlene Plater, M.D.  ASSISTANT:  Zigmund Daniel, M.D.  ANESTHESIA:  C.R.N.A. supervised Lestine Box, M.D.  RADIOLOGY CONSULTANT:  Dwyane Luo. Fischer, M.D.  INDICATIONS:  Henry [name] is 59 and has chronic cholecystolithiasis with food intolerance, right upper quadrant pain, and nausea.  This has gone on for many months.  It is fairly constant with intermittent sharper attacks.  He is now ready for surgery.  His laboratory data are noted.  A bilirubin is slightly elevated.  DESCRIPTION OF PROCEDURE:  The patient was taken to the operating room and placed supine.  General endotracheal anesthesia administered.  The abdomen was shaved, prepped, and draped in the usual fashion.  An infraumbilical incision was made horizontally, fascia entered and divided vertically, and the peritoneum entered without complication.  The Hasson catheter was placed and tied in place and the abdomen insufflated.  The camera was introduced.  The general peritoneoscopy was unremarkable.  The gallbladder appeared thickened and did have some omental adhesions.  Marcaine 0.5% with epinephrine was used before each incision was made.  An epigastric 10 mm catheter was placed.  Two 5 mm catheters were placed in the right upper quadrant under direct vision. Instruments applied.  The gallbladder was grasped.  The adhesions were taken down.  The sigmoid-shaped body of the gallbladder was carefully bluntly dissected.  It straightened out  nicely and tapered into a normal infundibulum of the gallbladder and cystic duct.  The artery was anterior.  It was dissected and triply clipped and divided.  A clip was placed on the infundibulum of the gallbladder.  A small incision was made in the cystic duct.  The cholangiogram catheter was introduced.  Under real time fluoroscopy, the biliary tree was filled and carefully examined.  There was considerable spasm at the distal common bile duct, but it did fill.  There was no meniscus and no filling defects.  Only a small amount of dye entered the duodenum.  The overall hepatic tree appeared normal as well.  I did ask Dwyane Luo. Fischer, M.D., to examine the real time x-rays.  He agreed with that opinion.  The catheter was removed.  The stump of the cystic duct was triply clipped.  It was divided.  Then the gallbladder was removed from the gallbladder bed.  One hole was made in the gallbladder and considerable thick bile and gravely stones were suctioned away.  The gallbladder was removed, h w from the hepatic bed without difficulty or complication.  A small posterior artery was doubly clipped.  Copious irrigation was carried out.  The gallbladder was placed in a bag and removed from the abdomen through the infraumbilical incision.  Irrigation was carried out until all of it was clear.  Then the 5 mm trocars were removed under direct vision.  There was bleeding from the more medial of  the two 5 mm trocars.  That incision was widened and the fascia was sutured with a 0 Vicryl and the bleeding was completely controlled.  The other 5 mm catheter was reinserted under direct vision and copious irrigation carried out until all blood was removed.  No further bleeding was noted under direct vision.  With this, the procedure was complete.  All irrigation, CO2, instruments, and trocars, of course, had been removed.  Counts were correct.  All skin incisions were closed with 4-0 Monocryl.  He tolerated  it well.  The second count was correct.  Examination of the gallbladder in its deflated state did reveal some small stones still contained within the gallbladder.  It was sent for pathology. DD:  08/18/99 TD:  08/20/99 Job: [id number]

## 2011-05-08 ENCOUNTER — Ambulatory Visit
Admission: RE | Admit: 2011-05-08 | Discharge: 2011-05-08 | Disposition: A | Discharge status: Home / Self Care | Payer: Medicare Other | Source: Ambulatory Visit | Attending: Specialist | Admitting: Specialist

## 2011-05-08 DIAGNOSIS — R531 Weakness: Secondary | ICD-10-CM

## 2011-05-08 DIAGNOSIS — M79602 Pain in left arm: Secondary | ICD-10-CM

## 2011-06-13 ENCOUNTER — Other Ambulatory Visit: Payer: Self-pay | Admitting: Family Medicine

## 2011-06-14 ENCOUNTER — Ambulatory Visit (INDEPENDENT_AMBULATORY_CARE_PROVIDER_SITE_OTHER): Payer: Medicare Other | Admitting: Family Medicine

## 2011-06-14 VITALS — BP 112/60 | HR 83 | Temp 97.7°F | Resp 16 | Ht 69.0 in | Wt 206.0 lb

## 2011-06-14 DIAGNOSIS — Z01818 Encounter for other preprocedural examination: Secondary | ICD-10-CM

## 2011-06-14 DIAGNOSIS — M542 Cervicalgia: Secondary | ICD-10-CM

## 2011-06-14 DIAGNOSIS — E039 Hypothyroidism, unspecified: Secondary | ICD-10-CM

## 2011-06-14 DIAGNOSIS — R109 Unspecified abdominal pain: Secondary | ICD-10-CM

## 2011-06-14 DIAGNOSIS — I10 Essential (primary) hypertension: Secondary | ICD-10-CM

## 2011-06-14 DIAGNOSIS — E071 Dyshormogenetic goiter: Secondary | ICD-10-CM

## 2011-06-14 DIAGNOSIS — F419 Anxiety disorder, unspecified: Secondary | ICD-10-CM

## 2011-06-14 DIAGNOSIS — M25473 Effusion, unspecified ankle: Secondary | ICD-10-CM

## 2011-06-14 DIAGNOSIS — F411 Generalized anxiety disorder: Secondary | ICD-10-CM

## 2011-06-14 DIAGNOSIS — K439 Ventral hernia without obstruction or gangrene: Secondary | ICD-10-CM

## 2011-06-14 DIAGNOSIS — N400 Enlarged prostate without lower urinary tract symptoms: Secondary | ICD-10-CM

## 2011-06-14 DIAGNOSIS — G479 Sleep disorder, unspecified: Secondary | ICD-10-CM

## 2011-06-14 LAB — POCT CBC
Granulocyte percent: 59.6 %G (ref 37–80)
HCT, POC: 44.2 % (ref 43.5–53.7)
Hemoglobin: 13.9 g/dL — AB (ref 14.1–18.1)
Lymph, poc: 3 (ref 0.6–3.4)
MCH, POC: 24.1 pg — AB (ref 27–31.2)
MCHC: 31.4 g/dL — AB (ref 31.8–35.4)
MCV: 76.8 fL — AB (ref 80–97)
MID (cbc): 0.5 (ref 0–0.9)
MPV: 9.1 fL (ref 0–99.8)
POC Granulocyte: 5.2 (ref 2–6.9)
POC LYMPH PERCENT: 34.4 %L (ref 10–50)
POC MID %: 6 %M (ref 0–12)
Platelet Count, POC: 388 10*3/uL (ref 142–424)
RBC: 5.76 M/uL (ref 4.69–6.13)
RDW, POC: 15.7 %
WBC: 8.8 10*3/uL (ref 4.6–10.2)

## 2011-06-14 MED ORDER — LORAZEPAM 1 MG PO TABS: ORAL_TABLET | ORAL | Status: DC

## 2011-06-14 MED ORDER — AMLODIPINE BESY-BENAZEPRIL HCL 10-20 MG PO CAPS: 1.0000 | ORAL_CAPSULE | Freq: Every day | ORAL | Status: DC

## 2011-06-14 MED ORDER — LEVOTHYROXINE SODIUM 100 MCG PO TABS: ORAL_TABLET | ORAL | Status: DC

## 2011-06-14 MED ORDER — LIDOCAINE 5 % EX PTCH: 1.0000 | MEDICATED_PATCH | Freq: Every day | CUTANEOUS | Status: DC | PRN

## 2011-06-14 MED ORDER — TRAZODONE HCL 50 MG PO TABS: 25.0000 mg | ORAL_TABLET | Freq: Every evening | ORAL | Status: DC | PRN

## 2011-06-14 MED ORDER — TAMSULOSIN HCL 0.4 MG PO CAPS: 0.4000 mg | ORAL_CAPSULE | Freq: Every day | ORAL | Status: DC

## 2011-06-14 MED ORDER — ZOLPIDEM TARTRATE 10 MG PO TABS: ORAL_TABLET | ORAL | Status: DC

## 2011-06-14 NOTE — Progress Notes (Signed)
Preop physical exam  History: Henry [name] is a 66 year old man who has been having a lot of trouble with pain in his neck and upper back area, radiating down into his shoulders. Dr. Otelia Sergeant has evaluated him and he is recommending surgical treatment on his neck and upper back. He requested a preop medical consultation.  The patient has no major acute complaints today other than the neck. He has chronic problems with pain of his abdominal wall from his multiple ventral hernias. He continues to have chronic problem with sleep deprivation, and finds he needs to take 1.5 Ambien at bedtime to help him rest. He also has a trouble with swelling in his ankles come primarily the right. 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  Past medical history: Previous surgeries include surgery on the is left shoulder and on his abdomen. The abdominal surgery it is up from a gallbladder to a perforated colon 2 bowel resections, leaving him with a hernias that he has. He has also had back surgery.  Previous medical problems including hypothyroidism, which we're regulate this time. He has hypertension which has been controlled.  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 He is married lives with his wife.  Review of systems: HEENT unremarkable cardiovascular unremarkable respiratory unremarkable GI unremarkable except for abdominal wall pain. GU he does have BPH but does okay on the Flomax. Osseous skeletal has lots of chronic pain. Is unable to do much exercise now because of chronic pain. Neurologic unremarkable  Physical examination:  TMs are normal throat clear neck supple without nodes or thyromegaly no carotid bruits. Chest was clear to auscultation. Heart regular without murmurs gallops arrhythmias. Abdomen soft without masses but he does have the multiple abdominal wall hernias which are tender. Extremities skin warm and  dry.does not have any edema today.  Assessment: See problem list  Plan: Labs have been ordered. The main reservations about surgery would be if he has abnormal thyroid functions. Hopefully they got corrected with the previous change of dose otherwise I believe he should be able to tolerate surgery well.  Will add trazodone for rest  Check thyroid function to see if it has improved.  Since CBC to his doctor at [facility]  This was a period prolonged visit, for greater than 60 minutes, with preop assessment and time spent regarding his sleep and hypothyroidism

## 2011-06-15 ENCOUNTER — Telehealth: Payer: Self-pay

## 2011-06-15 LAB — COMPREHENSIVE METABOLIC PANEL
ALT: 20 U/L (ref 0–53)
AST: 17 U/L (ref 0–37)
Albumin: 4.8 g/dL (ref 3.5–5.2)
Alkaline Phosphatase: 71 U/L (ref 39–117)
BUN: 21 mg/dL (ref 6–23)
CO2: 25 mEq/L (ref 19–32)
Calcium: 9.3 mg/dL (ref 8.4–10.5)
Chloride: 103 mEq/L (ref 96–112)
Creat: 1.11 mg/dL (ref 0.50–1.35)
Glucose, Bld: 94 mg/dL (ref 70–99)
Potassium: 3.7 mEq/L (ref 3.5–5.3)
Sodium: 138 mEq/L (ref 135–145)
Total Bilirubin: 0.9 mg/dL (ref 0.3–1.2)
Total Protein: 7.1 g/dL (ref 6.0–8.3)

## 2011-06-15 LAB — TSH: TSH: 5.349 u[IU]/mL — ABNORMAL HIGH (ref 0.350–4.500)

## 2011-06-15 NOTE — Telephone Encounter (Signed)
NEEDS TO TALK WITH SOMEONE ABOUT WHY DR HOPPER CHANGE HIS BLOOD PRESSURE MEDICATION .

## 2011-06-16 MED ORDER — BENAZEPRIL-HYDROCHLOROTHIAZIDE 20-25 MG PO TABS: 1.0000 | ORAL_TABLET | Freq: Every day | ORAL | Status: DC

## 2011-06-16 NOTE — Telephone Encounter (Signed)
SPOKE WITH PT'S WIFE. DR HOPPER CHANGED HIS BP MED FROM BENAZEPRIL/HCTZ TO AMLODIPINE. SHE WANTS TO KNOW WHY THIS WAS DONE, BECAUSE IT WAS NEVER MENTIONED TO THE PATIENT. AND THE NEW MED IS MORE EXPENSIVE, SO THEY WOULD PREFER TO GO BACK TO THE ORIGINAL. PLEASE ADVISE.

## 2011-06-16 NOTE — Telephone Encounter (Signed)
There was a mistake in the chart with the medication documentation the correct medication was sent to pharmacy and I talked with wife and husband regarding the issue.

## 2011-06-19 ENCOUNTER — Encounter: Payer: Self-pay | Admitting: Family Medicine

## 2011-06-20 ENCOUNTER — Telehealth: Payer: Self-pay

## 2011-06-20 NOTE — Telephone Encounter (Signed)
.  [facility] Henry [name] WOULD LIKE SARAH WEBER OR A NURSE TO CALL HER BACK REGARDING HER HUSBAND'S MEDICINE. PLEASE CALL [phone]

## 2011-06-21 NOTE — Telephone Encounter (Signed)
SPOKE WITH PT'S WIFE--SHE SAYS HE NEEDS BENAZEPRIL/HCTZ 10-12.5. HE RECEIVED AS OF YESTERDAY BENZAPRIL/HCTZ 20-25. SHE WANTS TO KNOW IF THIS IS WRONG DOSE OR IF THEY ARE SUPPOSED TO CUT THE PILL IN HALF--THIS IS A HIGHER DOSE THAN HIS ORIGINAL.

## 2011-06-21 NOTE — Telephone Encounter (Signed)
Reviewed chart in 11/12 pts dose was increased due to uncontrolled BP.  LM with this information.

## 2011-07-10 SURGERY — Surgical Case
Anesthesia: *Unknown

## 2011-08-08 ENCOUNTER — Other Ambulatory Visit: Payer: Self-pay

## 2011-08-08 NOTE — Telephone Encounter (Signed)
Patient checking the status of his rx refill request for Ambien sent by the pharmacy.

## 2011-08-09 ENCOUNTER — Telehealth: Payer: Self-pay

## 2011-08-09 NOTE — Telephone Encounter (Signed)
pts wife came into 102 to check on pts Rx for Ambien that her pharm has been faxing to [place]. Explained that pharm needs to send electronically and pt told pharm this on the phone, but they insist that they can not even though we have printed off this Rx in surescripts in Feb. Wife seems to be feeling "caught in between" the problem. Can we RF this Rx for pt?

## 2011-08-09 NOTE — Telephone Encounter (Signed)
There are 5 refills on his 2/13 rx

## 2011-08-10 ENCOUNTER — Telehealth: Payer: Self-pay | Admitting: Internal Medicine

## 2011-08-10 NOTE — Telephone Encounter (Signed)
Refill request for Remus Loffler denied as #30 with 5 rf prescsribed 06/14/11

## 2011-08-10 NOTE — Telephone Encounter (Signed)
Called pharm who did not have a record of the Rx sent for pt on 06/14/11 by Dr Alwyn Ren w/5 RFs. Gave pharmacist Rx info as written by Dr Alwyn Ren w/only 3 RFs since 2 mos have past since orig Rx written. Notified pt

## 2011-10-07 ENCOUNTER — Other Ambulatory Visit: Payer: Self-pay

## 2011-10-07 DIAGNOSIS — F419 Anxiety disorder, unspecified: Secondary | ICD-10-CM

## 2011-10-07 MED ORDER — LORAZEPAM 1 MG PO TABS: ORAL_TABLET | ORAL | Status: DC

## 2011-12-01 ENCOUNTER — Other Ambulatory Visit: Payer: Self-pay | Admitting: Family Medicine

## 2011-12-01 DIAGNOSIS — F419 Anxiety disorder, unspecified: Secondary | ICD-10-CM

## 2011-12-01 MED ORDER — LORAZEPAM 1 MG PO TABS: ORAL_TABLET | ORAL | Status: DC

## 2011-12-14 DIAGNOSIS — M12579 Traumatic arthropathy, unspecified ankle and foot: Secondary | ICD-10-CM | POA: Insufficient documentation

## 2011-12-19 ENCOUNTER — Ambulatory Visit (INDEPENDENT_AMBULATORY_CARE_PROVIDER_SITE_OTHER): Payer: Medicare Other | Admitting: Family Medicine

## 2011-12-19 VITALS — BP 120/62 | HR 84 | Temp 98.2°F | Resp 16 | Ht 66.0 in | Wt 196.0 lb

## 2011-12-19 DIAGNOSIS — F411 Generalized anxiety disorder: Secondary | ICD-10-CM

## 2011-12-19 DIAGNOSIS — M542 Cervicalgia: Secondary | ICD-10-CM

## 2011-12-19 DIAGNOSIS — G479 Sleep disorder, unspecified: Secondary | ICD-10-CM

## 2011-12-19 DIAGNOSIS — N400 Enlarged prostate without lower urinary tract symptoms: Secondary | ICD-10-CM

## 2011-12-19 DIAGNOSIS — K439 Ventral hernia without obstruction or gangrene: Secondary | ICD-10-CM

## 2011-12-19 DIAGNOSIS — G8929 Other chronic pain: Secondary | ICD-10-CM

## 2011-12-19 DIAGNOSIS — F419 Anxiety disorder, unspecified: Secondary | ICD-10-CM

## 2011-12-19 DIAGNOSIS — E071 Dyshormogenetic goiter: Secondary | ICD-10-CM

## 2011-12-19 MED ORDER — ZOLPIDEM TARTRATE 10 MG PO TABS: ORAL_TABLET | ORAL | Status: DC

## 2011-12-19 MED ORDER — HYDROXYZINE PAMOATE 50 MG PO CAPS: ORAL_CAPSULE | ORAL | Status: DC

## 2011-12-19 MED ORDER — TRAMADOL HCL 50 MG PO TABS: 50.0000 mg | ORAL_TABLET | Freq: Three times a day (TID) | ORAL | Status: DC | PRN

## 2011-12-19 MED ORDER — LEVOTHYROXINE SODIUM 125 MCG PO TABS: ORAL_TABLET | ORAL | Status: DC

## 2011-12-19 MED ORDER — BENAZEPRIL-HYDROCHLOROTHIAZIDE 20-25 MG PO TABS: 1.0000 | ORAL_TABLET | Freq: Every day | ORAL | Status: DC

## 2011-12-19 MED ORDER — LORAZEPAM 1 MG PO TABS: ORAL_TABLET | ORAL | Status: DC

## 2011-12-19 MED ORDER — LIDOCAINE 5 % EX PTCH: 1.0000 | MEDICATED_PATCH | Freq: Every day | CUTANEOUS | Status: DC | PRN

## 2011-12-19 MED ORDER — TAMSULOSIN HCL 0.4 MG PO CAPS: 0.4000 mg | ORAL_CAPSULE | Freq: Every day | ORAL | Status: DC

## 2011-12-19 NOTE — Patient Instructions (Addendum)
Return if worse. Take medications as discussed.

## 2011-12-19 NOTE — Progress Notes (Signed)
Subjective: Patient is here for a number of things. His sleep is his biggest concern. He would like something better for his sleep. He wants to go up on the Ambien. I told him I could not do so. He says the pharmacist had called for more and we did not call back. I told him that there is no record of any calls from the pharmacy.  He has problems with ventral hernia sac a lot of chronic pain from that. He would like to be a chronic pain medicine. Cannot be on anything it constipates him a lot because he gets more problems with that.  He takes his regular medicines, and needs refills on everything. He's been coming in every 6 months he is seeing Dr. Kateri Mc to try and decide whether or not to do a total ankle on him. That would give a long. This filled the postop period  Objective: Throat clear. Neck supple without nodes thyromegaly. Chest clear. Heart regular without murmurs. Abdomen soft without masses but has a ventral hernia and large midline hernia scar. He's got generalized tenderness.  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 Changes Synthroid dose to 0.125 daily. Wait untit next visit to check his labs since he has not had everything.

## 2012-04-30 ENCOUNTER — Telehealth: Payer: Self-pay

## 2012-04-30 DIAGNOSIS — M25579 Pain in unspecified ankle and joints of unspecified foot: Secondary | ICD-10-CM

## 2012-04-30 NOTE — Telephone Encounter (Signed)
Pt and his wife called stating that they need to talk with someone about getting a written referral to send to Sabrina at [facility]-pt has a pre op appt on 05/02/12 and surgery appt on 05/16/12 with dr Remer Macho for an ankle replacement and they received a  Call stating that if they did not get the written referral that [facility] secondary to medicare will not cover the surgey or a GAP exception pt would like someone to call sabrina at [phone]  And if you have any questions you can call patient at [phone] thanks

## 2012-04-30 NOTE — Telephone Encounter (Signed)
He needs the referral for billing purposes at [facility], they need consulting physician to send referral so they can bill as a consult. Patient has been seeing Dr Alwyn Ren here at his PCP

## 2012-04-30 NOTE — Telephone Encounter (Signed)
Dr. Frederik Pear note dated 12/19/11 indicates that pt was seeing a doctor at [facility] and trying to decide whether or not to do an total ankle, but we have never seen pt for ankle pain.  It seems like pt was already being seen at [facility], unclear who referred him there.  PCP is listed in epic as Dr. Cliffton Asters.  Can we get more information on what exactly is needed from [place]?

## 2012-04-30 NOTE — Telephone Encounter (Signed)
I have twice discussed with him his ankle, and seeing somebody at [facility]. He never asked for a formal referral there, but I believe I can make that for him.

## 2012-04-30 NOTE — Telephone Encounter (Signed)
Can we send referral to [facility]? I do not see anything about ankle. Will pull chart.

## 2012-05-01 NOTE — Addendum Note (Signed)
Addended by: Sheppard Plumber A on: 05/01/2012 03:22 PM   Modules accepted: Orders

## 2012-05-01 NOTE — Telephone Encounter (Signed)
Spoke w/Donna to find out what is needed for referral and I have put in order for referral per Dr Hopper's instr's.

## 2012-05-02 DIAGNOSIS — E039 Hypothyroidism, unspecified: Secondary | ICD-10-CM | POA: Insufficient documentation

## 2012-05-17 HISTORY — PX: TOTAL ANKLE REPLACEMENT: SUR1218

## 2012-05-22 ENCOUNTER — Other Ambulatory Visit: Payer: Self-pay | Admitting: Family Medicine

## 2012-05-22 NOTE — Telephone Encounter (Signed)
Needs office visit and labs.

## 2012-06-11 ENCOUNTER — Telehealth: Payer: Self-pay

## 2012-06-11 DIAGNOSIS — G479 Sleep disorder, unspecified: Secondary | ICD-10-CM

## 2012-06-11 DIAGNOSIS — F419 Anxiety disorder, unspecified: Secondary | ICD-10-CM

## 2012-06-11 NOTE — Telephone Encounter (Signed)
Called he is due for followup he has recently had an ankle replacement and can not come in now. Please advise on refills

## 2012-06-11 NOTE — Telephone Encounter (Signed)
PT NEEDS REFILL ON ALL OF HIS REG MEDICATIONS. HE SAID HIS PHARMACY TOLD HIM TO CALL us PLEASE CALL PATIENT AT [phone]

## 2012-06-13 ENCOUNTER — Other Ambulatory Visit: Payer: Self-pay | Admitting: Radiology

## 2012-06-13 MED ORDER — ZOLPIDEM TARTRATE 10 MG PO TABS: ORAL_TABLET | ORAL | Status: DC

## 2012-06-13 MED ORDER — TAMSULOSIN HCL 0.4 MG PO CAPS: 0.4000 mg | ORAL_CAPSULE | Freq: Every day | ORAL | Status: DC

## 2012-06-13 MED ORDER — BENAZEPRIL-HYDROCHLOROTHIAZIDE 20-25 MG PO TABS: 1.0000 | ORAL_TABLET | Freq: Every day | ORAL | Status: DC

## 2012-06-13 MED ORDER — LEVOTHYROXINE SODIUM 125 MCG PO TABS: 125.0000 ug | ORAL_TABLET | Freq: Every day | ORAL | Status: DC

## 2012-06-13 MED ORDER — LORAZEPAM 1 MG PO TABS: ORAL_TABLET | ORAL | Status: DC

## 2012-06-13 NOTE — Telephone Encounter (Signed)
Thanks, he plans to come in when mobile

## 2012-06-13 NOTE — Telephone Encounter (Signed)
Call:  Give him 2 refills on all that need refills.  Tell him to return when he is mobile.

## 2012-07-10 ENCOUNTER — Ambulatory Visit (INDEPENDENT_AMBULATORY_CARE_PROVIDER_SITE_OTHER): Payer: Medicare Other | Admitting: Family Medicine

## 2012-07-10 VITALS — BP 130/80 | HR 69 | Temp 97.9°F | Resp 16 | Ht 66.0 in | Wt 192.0 lb

## 2012-07-10 DIAGNOSIS — R197 Diarrhea, unspecified: Secondary | ICD-10-CM

## 2012-07-10 MED ORDER — ONDANSETRON HCL 4 MG PO TABS: 4.0000 mg | ORAL_TABLET | Freq: Three times a day (TID) | ORAL | Status: DC | PRN

## 2012-07-10 NOTE — Progress Notes (Signed)
Chief complaint: Diarrhea x5 days  History of present illness: Patient is a 67 year old gentleman with a past medical history significant for: Removal secondary to ruptured diverticulitis greater than 6 years ago as well as AV malformations being followed by [facility]. Patient states that he has had this problem for 5 days. The previous 3 days he had had significant amount of diarrhea that was watery but still had regular color, patient states that he had 6-12 stools a day. Patient states this was associated with nausea and vomiting. Tissue was concerned because he did eat some hotdogs at that time. Patient states in the last 2 days it is starting to form a regular stool. Patient denies any blood in the stool or any dark tarry stool. Patient states that he is not vomited for the last 48 hours. Patient to see week overall. Patient denies any significant abdominal pain he denies any fevers or chills or any abnormal weight loss. Patient states that he is still urinating regularly.  Past Medical History  Diagnosis Date  . Arthritis   . Hypertension    Past Surgical History  Procedure Laterality Date  . 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 Cholecystectomy    . Colon surgery    . Spine surgery    . Fracture surgery      left shoulder   History reviewed. No pertinent family history.  History  Substance Use Topics  . Smoking status: Never Smoker   . Smokeless tobacco: Not on file  . Alcohol Use: Not on file   Physical Exam Blood pressure 130/80, pulse 69, temperature 97.9 F (36.6 C), temperature source Oral, resp. rate 16, height 5\' 6"  (1.676 m), weight 192 lb (87.091 kg), SpO2 98.00%. General: No great distress patient is seen medications HEENT: Pupils equal round react to light and accommodation, extra ocular movements intact, moist mucous membranes Cardiovascular: Regular rate and rhythm no murmur Pulmonary: Clear to auscultation bilaterally Abdominal exam: Bowel sounds  positive patient is nontender. On inspection he does have surgical scars and does have multiple ventral hernias that he states has been his regular amount for years. No signs of infection.  Assessment: Diarrhea that seems to be resolving on its own likely viral but with history of AV malformation and colon resection.  Plan: The patient is not having any bloody stool and does not have any red flags and does seem to be improving. She was given conservative treatment for diarrhea including sensible diet and Zofran for symptomatic relief. Told patient to avoid such medications like Imodium in case this is infectious etiology. Patient in red flags that would be consistent with a GI bleed and went to seek medical attention. Strongly encourage patient to followup with his gastroenterologist in the next week. Patient knows it is not better in the next 72 hours to return for further evaluation. At that time I would get lab work.

## 2012-07-10 NOTE — Patient Instructions (Signed)
Very nice to me. I think you do have a stomach virus and is slowing down. Try a [employer]. Her when she did try bananas, rice, applesauce and toast I have sent you in some medicine called and Zofran. He can take one tab up to 3 times a day to help with her nausea. Once again, if he gets severe abdominal pain, black tarry stool, were significant amount of blood in your stool please see medical attention immediately. If you are not better by Saturday please come in again to be evaluated.

## 2012-07-16 ENCOUNTER — Telehealth: Payer: Self-pay

## 2012-07-16 DIAGNOSIS — R197 Diarrhea, unspecified: Secondary | ICD-10-CM

## 2012-07-16 NOTE — Telephone Encounter (Signed)
Patient has an appointment set up with Dr. [facility] in [place] on July 24, 2012.   He needs a referral for insurance purposes in writing.  Please call when he can come and pick up the written referral.   [phone]  Saw Dr. Nilda Simmer

## 2012-07-21 NOTE — Telephone Encounter (Signed)
Referral placed in EPIC.  Please call pt with this update.  I assume he is needing referral to GI for persistent diarrhea?  Did he worsen after appointment or has diarrhea just persisted?  Please get update on status.  Looks like he needs referral order in writing?  Please clarify and print off appropriate documentation.

## 2012-07-21 NOTE — Addendum Note (Signed)
Addended by: Ethelda Chick on: 07/21/2012 04:00 PM   Modules accepted: Orders

## 2012-07-22 NOTE — Telephone Encounter (Signed)
He states it is just for routine check with his GI doctor.

## 2012-07-23 ENCOUNTER — Telehealth: Payer: Self-pay

## 2012-07-23 NOTE — Telephone Encounter (Signed)
Call [place] [facility] for Quantity Exception on the Remus Loffler  [phone]  Last resort phone for providers is [phone]  Cell phone [phone]

## 2012-07-23 NOTE — Telephone Encounter (Signed)
Please advise, the Ambien exceeds recommended dosing, do you want to change sig, or do you want patient to pay for this out of pocket?

## 2012-07-24 ENCOUNTER — Telehealth: Payer: Self-pay

## 2012-07-24 NOTE — Telephone Encounter (Signed)
Called him, Henry [name] states she can do quantity limit over ride for quantity of 30. He is advised we will try to get insurance to cover the quantity of 30, but he may have to pay for the extra 5 out of pocket.

## 2012-07-24 NOTE — Telephone Encounter (Signed)
PATIENT STATES THAT [employer] HAS REJECTED HIS RX FOR THE GENERIC FORM OF AMBIEN WHICH WAS PRESCRIBED BY DR HOPPER.  PATIENT STATES THAT THIS SHOULD NOT HAVE HAPPENED AND HE HAS SPOKEN WITH THE INSURANCE COMPANY SEVERAL TIMES AND STATES THAT THERE ARE TWO DIFFERENT NUMBERS OUR OFFICE CAN CALL TO CORRECT THIS. QUALITY EXCEPTION: [phone]. THE PROVIDERS NUMBER [phone]. PATIENT STATES HE NEEDS THIS MEDICATION ASAP FOR A MEDICAL CONDITION THAT HE HAS. HOME #: [phone]. CELL #: [phone]

## 2012-07-24 NOTE — Telephone Encounter (Signed)
Please advise, I sent message to Dr Alwyn Ren, the dosing exceeds recommended dose,  Patient may have to pay for the extra quantity out of pocket, have you had this occur before?

## 2012-07-24 NOTE — Telephone Encounter (Signed)
He can pay for it out of pocket, unless he would like me to limit him to the 5 mg.   He has taken it a long time and says he needs the 10 mg.

## 2012-07-25 NOTE — Telephone Encounter (Signed)
Noted, patient was advised yesterday we will try for quantity limit override for the quantity of 30, but the additional 5 he will need to pay out of pocket.

## 2012-07-25 NOTE — Telephone Encounter (Signed)
Called [employer] and got prior auth approved for all #35/mos of Ambien 10 mg through 04/16/13. Contacted pt and pharmacy.

## 2012-08-14 ENCOUNTER — Other Ambulatory Visit: Payer: Self-pay | Admitting: Family Medicine

## 2012-08-14 NOTE — Telephone Encounter (Signed)
Needs OV, 2nd notice 

## 2012-08-16 ENCOUNTER — Other Ambulatory Visit: Payer: Self-pay | Admitting: Family Medicine

## 2012-08-16 NOTE — Telephone Encounter (Signed)
Must be seen before further refills after this one.

## 2012-09-11 ENCOUNTER — Other Ambulatory Visit: Payer: Self-pay | Admitting: Family Medicine

## 2012-09-11 ENCOUNTER — Other Ambulatory Visit: Payer: Self-pay | Admitting: Physician Assistant

## 2012-09-11 NOTE — Telephone Encounter (Signed)
Forward to Dr. Alwyn Ren.

## 2012-09-16 ENCOUNTER — Other Ambulatory Visit: Payer: Self-pay | Admitting: Physician Assistant

## 2012-09-20 ENCOUNTER — Other Ambulatory Visit: Payer: Self-pay

## 2012-10-17 ENCOUNTER — Other Ambulatory Visit: Payer: Self-pay | Admitting: Physician Assistant

## 2012-10-17 ENCOUNTER — Other Ambulatory Visit: Payer: Self-pay | Admitting: Family Medicine

## 2012-10-18 ENCOUNTER — Other Ambulatory Visit: Payer: Self-pay

## 2012-10-18 ENCOUNTER — Ambulatory Visit (INDEPENDENT_AMBULATORY_CARE_PROVIDER_SITE_OTHER): Payer: Medicare Other | Admitting: Emergency Medicine

## 2012-10-18 VITALS — BP 120/74 | HR 94 | Temp 97.6°F | Resp 18 | Ht 66.0 in | Wt 192.0 lb

## 2012-10-18 DIAGNOSIS — F411 Generalized anxiety disorder: Secondary | ICD-10-CM

## 2012-10-18 DIAGNOSIS — N400 Enlarged prostate without lower urinary tract symptoms: Secondary | ICD-10-CM

## 2012-10-18 DIAGNOSIS — E039 Hypothyroidism, unspecified: Secondary | ICD-10-CM

## 2012-10-18 DIAGNOSIS — I10 Essential (primary) hypertension: Secondary | ICD-10-CM

## 2012-10-18 DIAGNOSIS — G4700 Insomnia, unspecified: Secondary | ICD-10-CM

## 2012-10-18 LAB — POCT CBC
Granulocyte percent: 65.6 %G (ref 37–80)
HCT, POC: 43.9 % (ref 43.5–53.7)
Hemoglobin: 13.5 g/dL — AB (ref 14.1–18.1)
Lymph, poc: 1.9 (ref 0.6–3.4)
MCH, POC: 25.4 pg — AB (ref 27–31.2)
MCHC: 30.8 g/dL — AB (ref 31.8–35.4)
MCV: 82.5 fL (ref 80–97)
MID (cbc): 0.6 (ref 0–0.9)
MPV: 8.4 fL (ref 0–99.8)
POC Granulocyte: 4.9 (ref 2–6.9)
POC LYMPH PERCENT: 26.1 %L (ref 10–50)
POC MID %: 8.3 %M (ref 0–12)
Platelet Count, POC: 319 10*3/uL (ref 142–424)
RBC: 5.32 M/uL (ref 4.69–6.13)
RDW, POC: 16.1 %
WBC: 7.4 10*3/uL (ref 4.6–10.2)

## 2012-10-18 LAB — COMPREHENSIVE METABOLIC PANEL
ALT: 13 U/L (ref 0–53)
AST: 13 U/L (ref 0–37)
Albumin: 4.6 g/dL (ref 3.5–5.2)
Alkaline Phosphatase: 71 U/L (ref 39–117)
BUN: 15 mg/dL (ref 6–23)
CO2: 25 mEq/L (ref 19–32)
Calcium: 9.5 mg/dL (ref 8.4–10.5)
Chloride: 104 mEq/L (ref 96–112)
Creat: 1.19 mg/dL (ref 0.50–1.35)
Glucose, Bld: 87 mg/dL (ref 70–99)
Potassium: 3.8 mEq/L (ref 3.5–5.3)
Sodium: 137 mEq/L (ref 135–145)
Total Bilirubin: 1.1 mg/dL (ref 0.3–1.2)
Total Protein: 7.3 g/dL (ref 6.0–8.3)

## 2012-10-18 LAB — TSH: TSH: 2.086 u[IU]/mL (ref 0.350–4.500)

## 2012-10-18 MED ORDER — ZOLPIDEM TARTRATE 10 MG PO TABS: 10.0000 mg | ORAL_TABLET | Freq: Every evening | ORAL | Status: DC | PRN

## 2012-10-18 MED ORDER — TAMSULOSIN HCL 0.4 MG PO CAPS: 0.4000 mg | ORAL_CAPSULE | Freq: Every day | ORAL | Status: DC

## 2012-10-18 MED ORDER — LEVOTHYROXINE SODIUM 125 MCG PO TABS: 125.0000 ug | ORAL_TABLET | Freq: Every day | ORAL | Status: DC

## 2012-10-18 MED ORDER — BENAZEPRIL-HYDROCHLOROTHIAZIDE 20-25 MG PO TABS: 1.0000 | ORAL_TABLET | Freq: Every day | ORAL | Status: DC

## 2012-10-18 MED ORDER — LORAZEPAM 1 MG PO TABS: ORAL_TABLET | ORAL | Status: DC

## 2012-10-18 NOTE — Progress Notes (Signed)
  Subjective:    Patient ID: Henry [name], male    DOB: 1945-09-20, 67 y.o.   MRN: [id number]  HPI patient here requesting refills on his medications. He is requesting Ambien for sleep as well as refills on his blood pressure medications. Since his last visit here he has had surgery on his colon due to diverticulosis. He also has had surgery on his right ankle. He has been told he should have no invasive procedures due to the risk of infection    Review of Systems     Objective:   Physical Exam patient is alert and cooperative. His blood pressures is in range of 120/74. His neck is supple. His chest is clear to auscultation and percussion. His cardiac exam is regular rate without murmurs. There are multiple scars on the abdomen. He has multiple ventral hernias present. Extremities have trace edema    Results for orders placed in visit on 10/18/12  POCT CBC      Result Value Range   WBC 7.4  4.6 - 10.2 K/uL   Lymph, poc 1.9  0.6 - 3.4   POC LYMPH PERCENT 26.1  10 - 50 %L   MID (cbc) 0.6  0 - 0.9   POC MID % 8.3  0 - 12 %M   POC Granulocyte 4.9  2 - 6.9   Granulocyte percent 65.6  37 - 80 %G   RBC 5.32  4.69 - 6.13 M/uL   Hemoglobin 13.5 (*) 14.1 - 18.1 g/dL   HCT, POC 11.9  14.7 - 53.7 %   MCV 82.5  80 - 97 fL   MCH, POC 25.4 (*) 27 - 31.2 pg   MCHC 30.8 (*) 31.8 - 35.4 g/dL   RDW, POC 82.9     Platelet Count, POC 319  142 - 424 K/uL   MPV 8.4  0 - 99.8 fL       Assessment & Plan:  Labs ordered. Meds will be refilled.

## 2012-10-19 LAB — PSA, MEDICARE: PSA: 1.68 ng/mL (ref <=4.00)

## 2013-02-16 ENCOUNTER — Other Ambulatory Visit: Payer: Self-pay | Admitting: Emergency Medicine

## 2013-04-24 ENCOUNTER — Ambulatory Visit (INDEPENDENT_AMBULATORY_CARE_PROVIDER_SITE_OTHER): Payer: Medicare Other | Admitting: Family Medicine

## 2013-04-24 VITALS — BP 122/80 | HR 83 | Temp 97.5°F | Resp 18 | Ht 66.0 in | Wt 194.0 lb

## 2013-04-24 DIAGNOSIS — I10 Essential (primary) hypertension: Secondary | ICD-10-CM

## 2013-04-24 DIAGNOSIS — M542 Cervicalgia: Secondary | ICD-10-CM

## 2013-04-24 DIAGNOSIS — G4700 Insomnia, unspecified: Secondary | ICD-10-CM

## 2013-04-24 DIAGNOSIS — N400 Enlarged prostate without lower urinary tract symptoms: Secondary | ICD-10-CM

## 2013-04-24 DIAGNOSIS — E039 Hypothyroidism, unspecified: Secondary | ICD-10-CM

## 2013-04-24 DIAGNOSIS — F411 Generalized anxiety disorder: Secondary | ICD-10-CM

## 2013-04-24 LAB — POCT CBC
Granulocyte percent: 63.7 %G (ref 37–80)
HCT, POC: 46.8 % (ref 43.5–53.7)
Hemoglobin: 14.4 g/dL (ref 14.1–18.1)
Lymph, poc: 2.1 (ref 0.6–3.4)
MCH, POC: 24.7 pg — AB (ref 27–31.2)
MCHC: 30.8 g/dL — AB (ref 31.8–35.4)
MCV: 80.4 fL (ref 80–97)
MID (cbc): 0.6 (ref 0–0.9)
MPV: 8.6 fL (ref 0–99.8)
POC Granulocyte: 4.7 (ref 2–6.9)
POC LYMPH PERCENT: 28.8 %L (ref 10–50)
POC MID %: 7.5 %M (ref 0–12)
Platelet Count, POC: 343 10*3/uL (ref 142–424)
RBC: 5.82 M/uL (ref 4.69–6.13)
RDW, POC: 16.6 %
WBC: 7.4 10*3/uL (ref 4.6–10.2)

## 2013-04-24 LAB — LIPID PANEL
Cholesterol: 162 mg/dL (ref 0–200)
HDL: 36 mg/dL — ABNORMAL LOW (ref >39)
LDL Cholesterol: 111 mg/dL — ABNORMAL HIGH (ref 0–99)
Total CHOL/HDL Ratio: 4.5 Ratio
Triglycerides: 76 mg/dL (ref <150)
VLDL: 15 mg/dL (ref 0–40)

## 2013-04-24 LAB — COMPREHENSIVE METABOLIC PANEL
ALT: 14 U/L (ref 0–53)
AST: 15 U/L (ref 0–37)
Albumin: 4.2 g/dL (ref 3.5–5.2)
Alkaline Phosphatase: 69 U/L (ref 39–117)
BUN: 15 mg/dL (ref 6–23)
CO2: 25 mEq/L (ref 19–32)
Calcium: 9.8 mg/dL (ref 8.4–10.5)
Chloride: 102 mEq/L (ref 96–112)
Creat: 1.1 mg/dL (ref 0.50–1.35)
Glucose, Bld: 91 mg/dL (ref 70–99)
Potassium: 4.3 mEq/L (ref 3.5–5.3)
Sodium: 137 mEq/L (ref 135–145)
Total Bilirubin: 1.1 mg/dL (ref 0.3–1.2)
Total Protein: 7.4 g/dL (ref 6.0–8.3)

## 2013-04-24 LAB — TSH: TSH: 1.219 u[IU]/mL (ref 0.350–4.500)

## 2013-04-24 MED ORDER — TAMSULOSIN HCL 0.4 MG PO CAPS: 0.4000 mg | ORAL_CAPSULE | Freq: Every day | ORAL | Status: DC

## 2013-04-24 MED ORDER — LIDOCAINE 5 % EX PTCH: 1.0000 | MEDICATED_PATCH | Freq: Every day | CUTANEOUS | Status: DC | PRN

## 2013-04-24 MED ORDER — ZOLPIDEM TARTRATE 10 MG PO TABS: 10.0000 mg | ORAL_TABLET | Freq: Every evening | ORAL | Status: DC | PRN

## 2013-04-24 MED ORDER — LORAZEPAM 1 MG PO TABS: ORAL_TABLET | ORAL | Status: DC

## 2013-04-24 MED ORDER — BENAZEPRIL-HYDROCHLOROTHIAZIDE 20-25 MG PO TABS: 1.0000 | ORAL_TABLET | Freq: Every day | ORAL | Status: DC

## 2013-04-24 MED ORDER — LEVOTHYROXINE SODIUM 125 MCG PO TABS: 125.0000 ug | ORAL_TABLET | Freq: Every day | ORAL | Status: DC

## 2013-04-24 NOTE — Patient Instructions (Addendum)
Continue current medications  Return as necessary, but suggested this point in your life you come in every 6 months for a recheck.  The insurance company or pharmacy can contact the phone nurse know what number needs to be called for your prior authorization if it is needed

## 2013-04-24 NOTE — Progress Notes (Signed)
Subjective: 68 year old man who is here for recheck and refill on his medications. He's been doing well. He had his right ankle replaced during the last year. He was walking on it after 3 months and is very pleased. Generally he is feeling well. No major complaints. He is supposed to get a colonoscopy sometime in the future. He sees a gastroenterologist and was found. His orthopedic surgeon was at [facility]. He has no significant headaches or dizziness. Chest pains. He gets short of breath on exertion. His abdomen hurts from the for her is that he's had for a long time. His urinary flow is good on the medication. He continues with his anxiety attacks the anxiety medication on a regular basis with satisfactory control. He takes the Ambien every night. Previous trouble with authorization. We've tried things unsuccessfully, and he will need to be on this long-term.  He does do a lot of volunteering with the homeless and at church  Objective: Pleasant gentleman in no major distress. TMs are normal. Throat clear. Neck supple without nodes or thyromegaly. No carotid bruits. Chest is clear to auscultation. Heart regular without murmurs gallops or arrhythmias. No ankle edema.  Assessment: Hypertension Anxiety disorder Sleep disturbance DJD of ankle, resolved with total ankle replacement Abdominal hernias BPH by history, improve symptoms Hypothyroidism  Plan: Continue same medications Recheck labs He wants he is anxiety and sleep medication preauthorized. He says I have the number to call. I do not have the number. He does have some on call in here. He does need the medication as other meds have been tried and he will be on this chronically long term. He has been using it for long-term R. any. Please approve long-term use of the Ambien at bedtime and the lorazepam daily should he call us with a number to call for.  Results for orders placed in visit on 04/24/13  POCT CBC      Result Value Range    WBC 7.4  4.6 - 10.2 K/uL   Lymph, poc 2.1  0.6 - 3.4   POC LYMPH PERCENT 28.8  10 - 50 %L   MID (cbc) 0.6  0 - 0.9   POC MID % 7.5  0 - 12 %M   POC Granulocyte 4.7  2 - 6.9   Granulocyte percent 63.7  37 - 80 %G   RBC 5.82  4.69 - 6.13 M/uL   Hemoglobin 14.4  14.1 - 18.1 g/dL   HCT, POC 46.8  43.5 - 53.7 %   MCV 80.4  80 - 97 fL   MCH, POC 24.7 (*) 27 - 31.2 pg   MCHC 30.8 (*) 31.8 - 35.4 g/dL   RDW, POC 16.6     Platelet Count, POC 343  142 - 424 K/uL   MPV 8.6  0 - 99.8 fL

## 2013-04-28 ENCOUNTER — Telehealth: Payer: Self-pay

## 2013-04-28 ENCOUNTER — Encounter: Payer: Self-pay | Admitting: Family Medicine

## 2013-04-28 NOTE — Telephone Encounter (Signed)
PA is required for lidocaine patch. Dr Linna Darner, what Dx are you Rxing this for and do you know if pt has tried/failed oral NSAIDS or has contraindication for them?

## 2013-05-02 NOTE — Telephone Encounter (Signed)
I am afraid I need to ask you to call him back and find out exactly why we are using the patches. I think he had been on them in the past and they have done well. Ask if he can take NSAIDs or not. He is a poor historian and difficult to followup on some things. I am afraid and didn't document it well either.

## 2013-05-02 NOTE — Telephone Encounter (Signed)
Pt reports he uses for LBP from fusion and L shoulder pain from prior surgery w/screws and wire. He states that he has tried ibuprofen and aleve, but that he has had most of colon removed and h/o ulcers so is not supposed to use NSAIDS. Completed PA on covermymeds.

## 2013-05-04 NOTE — Telephone Encounter (Signed)
Thank you :)

## 2013-05-06 NOTE — Telephone Encounter (Signed)
PA approved through 05/01/14. Notified pharm and pt.

## 2013-07-25 ENCOUNTER — Telehealth: Payer: Self-pay

## 2013-07-25 NOTE — Telephone Encounter (Signed)
Wife stated she doesn't understand why there is a problem w/ins because pt pays OOP, not run through ins. Pharm states that ins will only cover total of 90 days in a year, but wife tried to explain to pharm that they don't want to run through ins. She stated that the cost keeps going up but still cheaper to pay OOP. I advised she may want to check cash pricing at other pharmacies. Wife agreed and will have RFs transferred if changes. Will call if any problems.

## 2013-07-25 NOTE — Telephone Encounter (Signed)
Patient wife states there is an issue with the Ambien rx. The pharmacy is saying the insurance will only approve 90 day supply but the rx was written for 6 months. They have been paying full price and not going thru the insurance so they dont understand why the insurance is giving them a hard time about it. Cb# [id number] (home) or [phone]  (wife cell). Uses [facility] on [address]. The person from the pharmacy is [place].

## 2013-10-08 ENCOUNTER — Ambulatory Visit (INDEPENDENT_AMBULATORY_CARE_PROVIDER_SITE_OTHER): Payer: Medicare Other | Admitting: Family Medicine

## 2013-10-08 VITALS — BP 118/76 | HR 80 | Temp 97.7°F | Resp 18 | Ht 66.0 in | Wt 190.8 lb

## 2013-10-08 DIAGNOSIS — F411 Generalized anxiety disorder: Secondary | ICD-10-CM

## 2013-10-08 DIAGNOSIS — M8949 Other hypertrophic osteoarthropathy, multiple sites: Secondary | ICD-10-CM

## 2013-10-08 DIAGNOSIS — E039 Hypothyroidism, unspecified: Secondary | ICD-10-CM

## 2013-10-08 DIAGNOSIS — M150 Primary generalized (osteo)arthritis: Secondary | ICD-10-CM

## 2013-10-08 DIAGNOSIS — I10 Essential (primary) hypertension: Secondary | ICD-10-CM

## 2013-10-08 DIAGNOSIS — M159 Polyosteoarthritis, unspecified: Secondary | ICD-10-CM

## 2013-10-08 DIAGNOSIS — N400 Enlarged prostate without lower urinary tract symptoms: Secondary | ICD-10-CM

## 2013-10-08 DIAGNOSIS — G4700 Insomnia, unspecified: Secondary | ICD-10-CM

## 2013-10-08 LAB — POCT CBC
Granulocyte percent: 65.2 %G (ref 37–80)
HCT, POC: 46.2 % (ref 43.5–53.7)
Hemoglobin: 14.8 g/dL (ref 14.1–18.1)
Lymph, poc: 2.1 (ref 0.6–3.4)
MCH, POC: 25.3 pg — AB (ref 27–31.2)
MCHC: 32 g/dL (ref 31.8–35.4)
MCV: 79.1 fL — AB (ref 80–97)
MID (cbc): 0.5 (ref 0–0.9)
MPV: 8.5 fL (ref 0–99.8)
POC Granulocyte: 4.8 (ref 2–6.9)
POC LYMPH PERCENT: 27.8 %L (ref 10–50)
POC MID %: 7 %M (ref 0–12)
Platelet Count, POC: 397 10*3/uL (ref 142–424)
RBC: 5.84 M/uL (ref 4.69–6.13)
RDW, POC: 16.2 %
WBC: 7.4 10*3/uL (ref 4.6–10.2)

## 2013-10-08 LAB — TSH: TSH: 1.08 u[IU]/mL (ref 0.350–4.500)

## 2013-10-08 MED ORDER — BENAZEPRIL-HYDROCHLOROTHIAZIDE 20-25 MG PO TABS: 1.0000 | ORAL_TABLET | Freq: Every day | ORAL | Status: DC

## 2013-10-08 MED ORDER — HYDROCODONE-ACETAMINOPHEN 5-325 MG PO TABS: ORAL_TABLET | ORAL | Status: DC

## 2013-10-08 MED ORDER — TAMSULOSIN HCL 0.4 MG PO CAPS: 0.4000 mg | ORAL_CAPSULE | Freq: Every day | ORAL | Status: DC

## 2013-10-08 MED ORDER — LEVOTHYROXINE SODIUM 125 MCG PO TABS: 125.0000 ug | ORAL_TABLET | Freq: Every day | ORAL | Status: DC

## 2013-10-08 MED ORDER — ZOLPIDEM TARTRATE 10 MG PO TABS: 10.0000 mg | ORAL_TABLET | Freq: Every evening | ORAL | Status: DC | PRN

## 2013-10-08 MED ORDER — CELECOXIB 200 MG PO CAPS
ORAL_CAPSULE | ORAL | Status: DC
Start: 2013-10-08 — End: 2014-03-16

## 2013-10-08 MED ORDER — LORAZEPAM 1 MG PO TABS: ORAL_TABLET | ORAL | Status: DC

## 2013-10-08 NOTE — Progress Notes (Signed)
Subjective: 68 year old man who has come in for his routine check. He's been trying to exercise more has lost a few pounds. He feels fairly good except for he hurts all over. His joints hurting. His mother had a lot of osteoarthritis. His hands hurting more. His feet hurting more. He has chronic pain from his ventral wall hernias from his old surgical areas. He hurts in his shoulder where he is wired together. His ankle replacement is only thing that is generally well and is extremely happy with it. He recommends Dr. at [facility] for that. He says his doctor [facility] wants a CBC on him every 6 months. He is less active currently in the summer months.  Objective: Overweight gentleman, pleasant with no acute distress. His throat is clear. No thyromegaly. Chest clear. Heart regular without murmurs gallops or arrhythmias. Abdomen is a lot of old surgical scars is generally very tender. Extremities without edema.  Assessment: DJD chronic pain History of ventral hernias Hypothyroidism Hypertension History of anemia Insomnia BPH Anxiety  Plan: Refill his medications. Giving him a few hydrocodone just for when necessary use when it hurts a lot. Try some Celebrex on him per his request. Return in about 6 months. Check TSH and CBC on him he wants a CBC sent to his other Dr.  Results for orders placed in visit on 10/08/13  POCT CBC      Result Value Ref Range   WBC 7.4  4.6 - 10.2 K/uL   Lymph, poc 2.1  0.6 - 3.4   POC LYMPH PERCENT 27.8  10 - 50 %L   MID (cbc) 0.5  0 - 0.9   POC MID % 7.0  0 - 12 %M   POC Granulocyte 4.8  2 - 6.9   Granulocyte percent 65.2  37 - 80 %G   RBC 5.84  4.69 - 6.13 M/uL   Hemoglobin 14.8  14.1 - 18.1 g/dL   HCT, POC 46.2  43.5 - 53.7 %   MCV 79.1 (*) 80 - 97 fL   MCH, POC 25.3 (*) 27 - 31.2 pg   MCHC 32.0  31.8 - 35.4 g/dL   RDW, POC 16.2     Platelet Count, POC 397  142 - 424 K/uL   MPV 8.5  0 - 99.8 fL

## 2013-10-08 NOTE — Patient Instructions (Signed)
Continue current medications  Keep on trying to get a lot of regular exercise  Plan to return near the turn of the year.

## 2013-10-26 ENCOUNTER — Other Ambulatory Visit: Payer: Self-pay | Admitting: Emergency Medicine

## 2013-11-18 ENCOUNTER — Telehealth: Payer: Self-pay

## 2013-11-18 NOTE — Telephone Encounter (Signed)
Pt is requesting a refill of hydrocodone. Pt ran out sooner than it was supposed to because he's been taking 2 or 3 a day because the 1 pill a day was not doing anything for him. He says he needs a higher dosage.

## 2013-11-19 NOTE — Telephone Encounter (Signed)
Pt advised- given Dr Hopper's hours.

## 2013-11-19 NOTE — Telephone Encounter (Signed)
Needs to be rechecked to discuss longer-term treatment options. I will not refill the pain medications only sees someone in discusses the need for this

## 2013-12-04 ENCOUNTER — Telehealth: Payer: Self-pay

## 2013-12-04 NOTE — Telephone Encounter (Signed)
PA needed for celebrex. Pt has tried ibuprofen, naproxen and lidocaine patches. He has h/o gastric ulcers and partial colon removal. Completed form and faxed to [employer]. Pending.

## 2013-12-05 NOTE — Telephone Encounter (Signed)
PA approved through 04/16/14 for a tier cost sharing exception at tier 3 copay. Notified pharmacy and pt.

## 2013-12-31 ENCOUNTER — Ambulatory Visit (INDEPENDENT_AMBULATORY_CARE_PROVIDER_SITE_OTHER): Payer: Medicare Other

## 2013-12-31 ENCOUNTER — Ambulatory Visit (INDEPENDENT_AMBULATORY_CARE_PROVIDER_SITE_OTHER): Payer: Medicare Other | Admitting: Family Medicine

## 2013-12-31 VITALS — BP 134/88 | HR 93 | Temp 98.6°F | Resp 16 | Ht 66.0 in | Wt 197.0 lb

## 2013-12-31 DIAGNOSIS — M8949 Other hypertrophic osteoarthropathy, multiple sites: Secondary | ICD-10-CM

## 2013-12-31 DIAGNOSIS — R29898 Other symptoms and signs involving the musculoskeletal system: Secondary | ICD-10-CM

## 2013-12-31 DIAGNOSIS — M5430 Sciatica, unspecified side: Secondary | ICD-10-CM

## 2013-12-31 DIAGNOSIS — R209 Unspecified disturbances of skin sensation: Secondary | ICD-10-CM

## 2013-12-31 DIAGNOSIS — M5441 Lumbago with sciatica, right side: Secondary | ICD-10-CM

## 2013-12-31 DIAGNOSIS — R292 Abnormal reflex: Secondary | ICD-10-CM

## 2013-12-31 DIAGNOSIS — M5442 Lumbago with sciatica, left side: Principal | ICD-10-CM

## 2013-12-31 DIAGNOSIS — M150 Primary generalized (osteo)arthritis: Secondary | ICD-10-CM

## 2013-12-31 DIAGNOSIS — M159 Polyosteoarthritis, unspecified: Secondary | ICD-10-CM

## 2013-12-31 DIAGNOSIS — R200 Anesthesia of skin: Secondary | ICD-10-CM

## 2013-12-31 MED ORDER — PREDNISONE 20 MG PO TABS: ORAL_TABLET | ORAL | Status: DC

## 2013-12-31 MED ORDER — HYDROCODONE-ACETAMINOPHEN 5-325 MG PO TABS: ORAL_TABLET | ORAL | Status: DC

## 2013-12-31 NOTE — Progress Notes (Signed)
Subjective:    Patient ID: Henry [name], male    DOB: 1946-02-26, 68 y.o.   MRN: [id number]  HPI Henry [name] is a 68 y.o. male  Hurt back moving refrigerator 11 days ago, picking up and twisting.   Hx of lower lumbar fusion in 1991, with metal rods and screws. Worsened pain after 2nd -3rd day. No improvement since, called Dr. Kathi Ludwig (interventional radiologist - would be new patient), advised to be seen here first. Pain radiates down R leg.   No bowel or bladder incontinence, no saddle anesthesia, does feel numbness down leg and into groin but not saddle area.  R leg feels week.  Numbness into outer R leg inner thigh and groin.   Tx; alleve tid (otc), hydrocodone few times, but only one pill - min relief.    Patient Active Problem List   Diagnosis Date Noted  . ARTERIOVENOUS MALFORMATION 07/16/2009  . NONSPECIFIC ABN FINDING RAD & OTH EXAM GI TRACT 06/11/2009  . VOMITING ALONE 06/07/2009  . ABDOMINAL PAIN OTHER SPECIFIED SITE 06/07/2009  . DEPRESSION 07/19/2007  . HYPERTENSION 07/19/2007  . DIVERTICULITIS, HX OF 07/19/2007   Past Medical History  Diagnosis Date  . Arthritis   . Hypertension    Past Surgical History  Procedure Laterality Date  . 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 Cholecystectomy    . Colon surgery    . Spine surgery    . Fracture surgery      left shoulder   Allergies  Allergen Reactions  . Ciprofloxacin     REACTION: rash   Prior to Admission medications   Medication Sig Start Date End Date Taking? Authorizing Provider  benazepril-hydrochlorthiazide (LOTENSIN HCT) 20-25 MG per tablet Take 1 tablet by mouth daily. Need office visit for additional refills. Second Notice. 10/08/13  Yes Posey Boyer, MD  benazepril-hydrochlorthiazide (LOTENSIN HCT) 20-25 MG per tablet TAKE 1 TABLET BY MOUTH DAILY. NEED OFFICE VISIT FOR ADDITIONAL REFILLS. SECOND NOTICE.  l Yes Mancel Bale, PA-C  celecoxib (CELEBREX) 200 MG capsule Take one  twice daily if needed for arthritic pain 10/08/13  Yes Posey Boyer, MD  cholecalciferol (VITAMIN D) 1000 UNITS tablet Take 1,000 Units by mouth daily.   Yes Historical Provider, MD  fish oil-omega-3 fatty acids 1000 MG capsule Take 2 g by mouth daily.   Yes Historical Provider, MD  HYDROcodone-acetaminophen (NORCO) 5-325 MG per tablet Take one only if needed for severe pain 10/08/13  Yes Posey Boyer, MD  hydrOXYzine (VISTARIL) 50 MG capsule Take on about 1/2 hr before bedtime for sleep 12/19/11  Yes Posey Boyer, MD  levothyroxine (SYNTHROID, LEVOTHROID) 125 MCG tablet Take 1 tablet (125 mcg total) by mouth daily before breakfast. Need office visit and labs for additional refills. 10/08/13  Yes Posey Boyer, MD  lidocaine (LIDODERM) 5 % Place 1 patch onto the skin daily as needed. Remove & Discard patch within 12 hours or as directed by MD 04/24/13  Yes Posey Boyer, MD  LORazepam (ATIVAN) 1 MG tablet 1/2-1 daily as needed 10/08/13  Yes Posey Boyer, MD  naproxen (NAPROSYN) 250 MG tablet Take 250 mg by mouth 2 (two) times daily with a meal.   Yes Historical Provider, MD  ondansetron (ZOFRAN) 4 MG tablet Take 1 tablet (4 mg total) by mouth every 8 (eight) hours as needed for nausea. 07/10/12  Yes Lyndal Pulley, DO  tamsulosin (FLOMAX) 0.4 MG CAPS capsule Take 1 capsule (0.4  mg total) by mouth daily. 10/08/13  Yes Posey Boyer, MD  zolpidem (AMBIEN) 10 MG tablet Take 1 tablet (10 mg total) by mouth at bedtime as needed for sleep. 10/08/13  Yes Posey Boyer, MD  traZODone (DESYREL) 50 MG tablet Take 0.5-1 tablets (25-50 mg total) by mouth at bedtime as needed for sleep. 06/14/11 07/14/11  Posey Boyer, MD   History   Social History  . Marital Status: Married    Spouse Name: N/A    Number of Children: N/A  . Years of Education: N/A   Occupational History  . Not on file.   Social History Main Topics  . Smoking status: Never Smoker   . Smokeless tobacco: Not on file  . Alcohol Use: Not  on file  . Drug Use: Not on file  . Sexual Activity: Not on file   Other Topics Concern  . Not on file   Social History Narrative  . No narrative on file     Review of Systems  Constitutional: Negative for fever and chills.  Genitourinary: Negative for decreased urine volume and difficulty urinating (no new symptoms. ).  Musculoskeletal: Positive for arthralgias, back pain, gait problem and myalgias.  Skin: Negative for color change and rash.       Objective:   Physical Exam  Vitals reviewed. Constitutional: He appears well-developed and well-nourished.  HENT:  Head: Normocephalic and atraumatic.  Neck: Normal range of motion.  Pulmonary/Chest: Effort normal.  Abdominal: Soft. There is no tenderness.  Musculoskeletal: He exhibits tenderness.       Right shoulder: He exhibits decreased range of motion, tenderness, bony tenderness and decreased strength (unable to heel and toe walk, but supports weight with standing. ).       Lumbar back: He exhibits tenderness and spasm. He exhibits normal range of motion and no bony tenderness.  Positive seated SLR on R with radiation down leg. Ly ttp over healed lumbar scar to upper lumbar area midline.   guarded exam.    Neurological: He is alert. He has normal strength. No sensory deficit. Gait abnormal. He displays no Babinski's sign on the right side. He displays no Babinski's sign on the left side.  Reflex Scores:      Patellar reflexes are 0 on the right side and 2+ on the left side.      Achilles reflexes are 2+ on the right side and 2+ on the left side. antalgic  Skin: Skin is warm and dry.  Psychiatric: He has a normal mood and affect. His behavior is normal.   [facility] reading (PRIMARY) by  Dr. Carlota Raspberry: LS spine: L5-S1 fusion with hardware noted.  One of inferior screws broken (chronic issue per patient), but other hardware intact. Decreased disc space L4-5 slight retrolisthesis of L2 on L3.  Spoke to [place], Dr.  Estanislado Pandy on vacation this week. Spoke with on call radiologist.  Recommended NS eval or if imaging needed - CT myelogram.       Assessment & Plan:  Henry [name] is a 68 y.o. male Bilateral low back pain with right-sided sciatica - Plan: DG Lumbar Spine 2-3 Views  Numbness in right leg  Weakness of right leg  Absent right patellar reflex  prior L5-S1 fusion, with new acute injury to low back 11 days ago with loss of patellar reflex and weakness of R leg. Suspicious for L4 HNP, but subjective numbness across dorsal thigh to groin as well, involving other dermatomes. Patient intially wanted  to follow up with Dr. Estanislado Pandy, but discussed with other interventional radiologist on call as pt mentioned kyphoplasty, but this would not apparently change areflexia or weakness. Discussed with neurosurgeon on call. As no cauda equina sx's, and prior metal fusion, could check CT myelogram, or at minimum CT of lumbar spine, but this was declined by pt in office. Prednisone taper started, but advised need for referral to NS and imaging as above, especially with areflexia and muscle weakness, but again declined. HE plans on seeing how the prednisone works going into next week, then deciding on imaging or ND eval.  Understanding expressed of risks of delay in treatment, including persistent weakness or difficulty in ragingin strength of affected area.  ER precautions and cauda equina symptoms discussed, understanding expressed. Discussed hydrocodone 1-2 Q6h dosing if needed. SED.  Meds ordered this encounter  Medications  . predniSONE (DELTASONE) 20 MG tablet    Sig: 3 tabs by mouth once per day for 2 days, then 2 tabs by mouth per day for 2 days, then 1 tab by mouth each day for 2 days, then 1/2 tab by mouth each day for 2 days.    Dispense:  13 tablet    Refill:  0  . HYDROcodone-acetaminophen (NORCO) 5-325 MG per tablet    Sig: Take one only if needed for severe pain    Dispense:  40 tablet    Refill:   0   Patient Instructions  I suspect you have a herniated disc and with the loss pf the knee reflex and leg weakness, ct scan or ct myelogram with neurosurgery evaluation was recommended tonight. As this was declined tonight, let me know If you change your mind and would like me to refer you and order these tests.  As we discussed, I am concerned about the muscle weakness and either progression of these symptoms, or weakness that does not improve.  Will start prednisone, hydrocodone 1-2 at a time if needed. Return to the clinic or go to the nearest emergency room if any of your symptoms worsen or new symptoms occur. If you have any loss of control of urination or defecation, or any numbness in the genital area - proceed to the emergency room right away.   Herniated Disk A herniated disk occurs when a disk in your spine bulges out too far. This condition is also called a ruptured disk or slipped disk. Your spine (backbone) is made up of bones called vertebrae. Between each pair of vertebrae is an oval disk with a soft, spongy center that acts as a shock absorber when you move. The spongy center is surrounded by a tough outer ring. When you have a herniated disk, the spongy center of the disk bulges out or ruptures through the outer ring. A herniated disk can press on a nerve between your vertebrae and cause pain. A herniated disk can occur anywhere in your back or neck area, but the lower back is the most common spot. CAUSES  In many cases, a herniated disk occurs just from getting older. As you age, the spongy insides of your disks tend to shrink and dry out. A herniated disk can result from gradual wear and tear. Injury or sudden strain can also cause a herniated disk.  RISK FACTORS Aging is the main risk factor for a herniated disk. Other risk factors include:  Being a man between the ages of 10 and 37 years.  Having a job that requires heavy lifting, bending, or twisting.  Having a job that  requires long hours of driving.  Not getting enough exercise.  Being overweight.  Smoking. SIGNS AND SYMPTOMS  Signs and symptoms depend on which disk is herniated.  For a herniated disk in the lower back, you may have sharp pain in:  One part of your leg, hip, or buttocks.  The back of your calf.  The top or sole of your foot (sciatica).   For a herniated disk in the neck, you may feel pain:  When you move your neck.  Near or over your shoulder blade.  That moves to your upper arm, forearm, or fingers.   You may also have muscle weakness. It may be hard to:  Lift your leg or arm.  Stand on your toes.  Squeeze tightly with one of your hands.  Other symptoms can include:  Numbness or tingling in the affected areas of your body.  Loss of bladder or bowel control. This is a rare but serious sign of a severe herniated disk in the lower back. DIAGNOSIS  Your health care provider will do a physical exam. During this exam, you may have to move certain body parts or assume various positions. For example, your health care provider may do the straight-leg test. This is a good way to test for a herniated disk in your lower back. In this test, the health care provider lifts your leg while you lie on your back. This is to see if you feel pain down your leg. Your health care provider will also check for numbness or loss of feeling.  Your health care provider will also check your:  Reflexes.  Muscle strength.  Posture.  Other tests may be done to help in making a diagnosis. These may include:  An X-ray of the spine to rule out other causes of back pain.   Other imaging studies, such as an MRI or CT scan. This is to check whether the herniated disk is pressing on your spinal canal.  Electromyography (EMG). This test checks the nerves that control muscles. It is sometimes used to identify the specific area of nerve involvement.  TREATMENT  In many cases, herniated disk  symptoms go away over a period of days or weeks. You will most likely be free of symptoms in 3-4 months. Treatment may include the following:  The initial treatment for a herniated disk is ashort period of rest.  Bed rest is often limited to 1 or 2 days. Resting for too long delays recovery.  If you have a herniated disk in your lower back, you should avoid sitting as much as possible because sitting increases pressure on the disk.  Medicines. These may include:   Nonsteroidal anti-inflammatory drugs (NSAIDs).  Muscle relaxants for back spasms.  Narcotic pain medicine if your pain is very bad.   Steroid injections. You may need these along the involved nerve root to help control pain. The steroid is injected in the area of the herniated disk. It helps by reducing swelling around the disk.  Physical therapy. This may include exercises to strengthen the muscles that help support your spine.   You may need surgery if other treatments do not work.  HOME CARE INSTRUCTIONS Follow all your health care provider's instructions. These may include:  Take all medicines as directed by your health care provider.  Rest for 2 days and then start moving.  Do not sit or stand for long periods of time.  Maintain good posture when sitting and standing.  Avoid movements that cause pain, such as bending or lifting.  When you are able to start lifting things again:  [place] with your knees.  Keep your back straight.  Hold heavy objects close to your body.  If you are overweight, ask your health care provider to help you start a weight-loss program.  When you are able to start exercising, ask your health care provider how much and what type of exercise is best for you.  Work with a physical therapist on stretching and strengthening exercises for your back.  Do not wear high-heeled shoes.  Do not sleep on your belly.  Do not smoke.  Keep all follow-up visits as directed by your health  care provider. SEEK MEDICAL CARE IF:  You have back or neck pain that is not getting better after 4 weeks.  You have very bad pain in your back or neck.  You develop numbness, tingling, or weakness along with pain. SEEK IMMEDIATE MEDICAL CARE IF:   You have numbness, tingling, or weakness that makes you unable to use your arms or legs.  You lose control of your bladder or bowels.  You have dizziness or fainting.  You have shortness of breath.  MAKE SURE YOU:   Understand these instructions.  Will watch your condition.  Will get help right away if you are not doing well or get worse. Document Released: 03/31/2000 Document Revised: 08/18/2013 Document Reviewed: 03/07/2013 [facility] Patient Information 2015 [place], [place]. This information is not intended to replace advice given to you by your health care provider. Make sure you discuss any questions you have with your health care provider.   Back Pain, Adult Low back pain is very common. About 1 in 5 people have back pain.The cause of low back pain is rarely dangerous. The pain often gets better over time.About half of people with a sudden onset of back pain feel better in just 2 weeks. About 8 in 10 people feel better by 6 weeks.  CAUSES Some common causes of back pain include:  Strain of the muscles or ligaments supporting the spine.  Wear and tear (degeneration) of the spinal discs.  Arthritis.  Direct injury to the back. DIAGNOSIS Most of the time, the direct cause of low back pain is not known.However, back pain can be treated effectively even when the exact cause of the pain is unknown.Answering your caregiver's questions about your overall health and symptoms is one of the most accurate ways to make sure the cause of your pain is not dangerous. If your caregiver needs more information, he or she may order lab work or imaging tests (X-rays or MRIs).However, even if imaging tests show changes in your back, this  usually does not require surgery. HOME CARE INSTRUCTIONS For many people, back pain returns.Since low back pain is rarely dangerous, it is often a condition that people can learn to [facility] their own.   Remain active. It is stressful on the back to sit or stand in one place. Do not sit, drive, or stand in one place for more than 30 minutes at a time. Take short walks on level surfaces as soon as pain allows.Try to increase the length of time you walk each day.  Do not stay in bed.Resting more than 1 or 2 days can delay your recovery.  Do not avoid exercise or work.Your body is made to move.It is not dangerous to be active, even though your back may hurt.Your back will likely heal faster if you return  to being active before your pain is gone.  Pay attention to your body when you bend and lift. Many people have less discomfortwhen lifting if they bend their knees, keep the load close to their bodies,and avoid twisting. Often, the most comfortable positions are those that put less stress on your recovering back.  Find a comfortable position to sleep. Use a firm mattress and lie on your side with your knees slightly bent. If you lie on your back, put a pillow under your knees.  Only take over-the-counter or prescription medicines as directed by your caregiver. Over-the-counter medicines to reduce pain and inflammation are often the most helpful.Your caregiver may prescribe muscle relaxant drugs.These medicines help dull your pain so you can more quickly return to your normal activities and healthy exercise.  Put ice on the injured area.  Put ice in a plastic bag.  Place a towel between your skin and the bag.  Leave the ice on for 15-20 minutes, 03-04 times a day for the first 2 to 3 days. After that, ice and heat may be alternated to reduce pain and spasms.  Ask your caregiver about trying back exercises and gentle massage. This may be of some benefit.  Avoid feeling anxious or  stressed.Stress increases muscle tension and can worsen back pain.It is important to recognize when you are anxious or stressed and learn ways to manage it.Exercise is a great option. SEEK MEDICAL CARE IF:  You have pain that is not relieved with rest or medicine.  You have pain that does not improve in 1 week.  You have new symptoms.  You are generally not feeling well. SEEK IMMEDIATE MEDICAL CARE IF:   You have pain that radiates from your back into your legs.  You develop new bowel or bladder control problems.  You have unusual weakness or numbness in your arms or legs.  You develop nausea or vomiting.  You develop abdominal pain.  You feel faint. Document Released: 04/03/2005 Document Revised: 10/03/2011 Document Reviewed: 08/05/2013 [facility] Patient Information 2015 [place], [place]. This information is not intended to replace advice given to you by your health care provider. Make sure you discuss any questions you have with your health care provider.

## 2013-12-31 NOTE — Patient Instructions (Signed)
I suspect you have a herniated disc and with the loss pf the knee reflex and leg weakness, ct scan or ct myelogram with neurosurgery evaluation was recommended tonight. As this was declined tonight, let me know If you change your mind and would like me to refer you and order these tests.  As we discussed, I am concerned about the muscle weakness and either progression of these symptoms, or weakness that does not improve.  Will start prednisone, hydrocodone 1-2 at a time if needed. Return to the clinic or go to the nearest emergency room if any of your symptoms worsen or new symptoms occur. If you have any loss of control of urination or defecation, or any numbness in the genital area - proceed to the emergency room right away.   Herniated Disk A herniated disk occurs when a disk in your spine bulges out too far. This condition is also called a ruptured disk or slipped disk. Your spine (backbone) is made up of bones called vertebrae. Between each pair of vertebrae is an oval disk with a soft, spongy center that acts as a shock absorber when you move. The spongy center is surrounded by a tough outer ring. When you have a herniated disk, the spongy center of the disk bulges out or ruptures through the outer ring. A herniated disk can press on a nerve between your vertebrae and cause pain. A herniated disk can occur anywhere in your back or neck area, but the lower back is the most common spot. CAUSES  In many cases, a herniated disk occurs just from getting older. As you age, the spongy insides of your disks tend to shrink and dry out. A herniated disk can result from gradual wear and tear. Injury or sudden strain can also cause a herniated disk.  RISK FACTORS Aging is the main risk factor for a herniated disk. Other risk factors include:  Being a man between the ages of 110 and 72 years.  Having a job that requires heavy lifting, bending, or twisting.  Having a job that requires long hours of  driving.  Not getting enough exercise.  Being overweight.  Smoking. SIGNS AND SYMPTOMS  Signs and symptoms depend on which disk is herniated.  For a herniated disk in the lower back, you may have sharp pain in:  One part of your leg, hip, or buttocks.  The back of your calf.  The top or sole of your foot (sciatica).   For a herniated disk in the neck, you may feel pain:  When you move your neck.  Near or over your shoulder blade.  That moves to your upper arm, forearm, or fingers.   You may also have muscle weakness. It may be hard to:  Lift your leg or arm.  Stand on your toes.  Squeeze tightly with one of your hands.  Other symptoms can include:  Numbness or tingling in the affected areas of your body.  Loss of bladder or bowel control. This is a rare but serious sign of a severe herniated disk in the lower back. DIAGNOSIS  Your health care provider will do a physical exam. During this exam, you may have to move certain body parts or assume various positions. For example, your health care provider may do the straight-leg test. This is a good way to test for a herniated disk in your lower back. In this test, the health care provider lifts your leg while you lie on your back. This is to  see if you feel pain down your leg. Your health care provider will also check for numbness or loss of feeling.  Your health care provider will also check your:  Reflexes.  Muscle strength.  Posture.  Other tests may be done to help in making a diagnosis. These may include:  An X-ray of the spine to rule out other causes of back pain.   Other imaging studies, such as an MRI or CT scan. This is to check whether the herniated disk is pressing on your spinal canal.  Electromyography (EMG). This test checks the nerves that control muscles. It is sometimes used to identify the specific area of nerve involvement.  TREATMENT  In many cases, herniated disk symptoms go away over a  period of days or weeks. You will most likely be free of symptoms in 3-4 months. Treatment may include the following:  The initial treatment for a herniated disk is ashort period of rest.  Bed rest is often limited to 1 or 2 days. Resting for too long delays recovery.  If you have a herniated disk in your lower back, you should avoid sitting as much as possible because sitting increases pressure on the disk.  Medicines. These may include:   Nonsteroidal anti-inflammatory drugs (NSAIDs).  Muscle relaxants for back spasms.  Narcotic pain medicine if your pain is very bad.   Steroid injections. You may need these along the involved nerve root to help control pain. The steroid is injected in the area of the herniated disk. It helps by reducing swelling around the disk.  Physical therapy. This may include exercises to strengthen the muscles that help support your spine.   You may need surgery if other treatments do not work.  HOME CARE INSTRUCTIONS Follow all your health care provider's instructions. These may include:  Take all medicines as directed by your health care provider.  Rest for 2 days and then start moving.  Do not sit or stand for long periods of time.  Maintain good posture when sitting and standing.  Avoid movements that cause pain, such as bending or lifting.  When you are able to start lifting things again:  [place] with your knees.  Keep your back straight.  Hold heavy objects close to your body.  If you are overweight, ask your health care provider to help you start a weight-loss program.  When you are able to start exercising, ask your health care provider how much and what type of exercise is best for you.  Work with a physical therapist on stretching and strengthening exercises for your back.  Do not wear high-heeled shoes.  Do not sleep on your belly.  Do not smoke.  Keep all follow-up visits as directed by your health care provider. SEEK  MEDICAL CARE IF:  You have back or neck pain that is not getting better after 4 weeks.  You have very bad pain in your back or neck.  You develop numbness, tingling, or weakness along with pain. SEEK IMMEDIATE MEDICAL CARE IF:   You have numbness, tingling, or weakness that makes you unable to use your arms or legs.  You lose control of your bladder or bowels.  You have dizziness or fainting.  You have shortness of breath.  MAKE SURE YOU:   Understand these instructions.  Will watch your condition.  Will get help right away if you are not doing well or get worse. Document Released: 03/31/2000 Document Revised: 08/18/2013 Document Reviewed: 03/07/2013 [employer] Patient Information 2015  [employer]. This information is not intended to replace advice given to you by your health care provider. Make sure you discuss any questions you have with your health care provider.   Back Pain, Adult Low back pain is very common. About 1 in 5 people have back pain.The cause of low back pain is rarely dangerous. The pain often gets better over time.About half of people with a sudden onset of back pain feel better in just 2 weeks. About 8 in 10 people feel better by 6 weeks.  CAUSES Some common causes of back pain include:  Strain of the muscles or ligaments supporting the spine.  Wear and tear (degeneration) of the spinal discs.  Arthritis.  Direct injury to the back. DIAGNOSIS Most of the time, the direct cause of low back pain is not known.However, back pain can be treated effectively even when the exact cause of the pain is unknown.Answering your caregiver's questions about your overall health and symptoms is one of the most accurate ways to make sure the cause of your pain is not dangerous. If your caregiver needs more information, he or she may order lab work or imaging tests (X-rays or MRIs).However, even if imaging tests show changes in your back, this usually does not require  surgery. HOME CARE INSTRUCTIONS For many people, back pain returns.Since low back pain is rarely dangerous, it is often a condition that people can learn to [facility] their own.   Remain active. It is stressful on the back to sit or stand in one place. Do not sit, drive, or stand in one place for more than 30 minutes at a time. Take short walks on level surfaces as soon as pain allows.Try to increase the length of time you walk each day.  Do not stay in bed.Resting more than 1 or 2 days can delay your recovery.  Do not avoid exercise or work.Your body is made to move.It is not dangerous to be active, even though your back may hurt.Your back will likely heal faster if you return to being active before your pain is gone.  Pay attention to your body when you bend and lift. Many people have less discomfortwhen lifting if they bend their knees, keep the load close to their bodies,and avoid twisting. Often, the most comfortable positions are those that put less stress on your recovering back.  Find a comfortable position to sleep. Use a firm mattress and lie on your side with your knees slightly bent. If you lie on your back, put a pillow under your knees.  Only take over-the-counter or prescription medicines as directed by your caregiver. Over-the-counter medicines to reduce pain and inflammation are often the most helpful.Your caregiver may prescribe muscle relaxant drugs.These medicines help dull your pain so you can more quickly return to your normal activities and healthy exercise.  Put ice on the injured area.  Put ice in a plastic bag.  Place a towel between your skin and the bag.  Leave the ice on for 15-20 minutes, 03-04 times a day for the first 2 to 3 days. After that, ice and heat may be alternated to reduce pain and spasms.  Ask your caregiver about trying back exercises and gentle massage. This may be of some benefit.  Avoid feeling anxious or stressed.Stress increases  muscle tension and can worsen back pain.It is important to recognize when you are anxious or stressed and learn ways to manage it.Exercise is a great option. SEEK MEDICAL CARE IF:  You  have pain that is not relieved with rest or medicine.  You have pain that does not improve in 1 week.  You have new symptoms.  You are generally not feeling well. SEEK IMMEDIATE MEDICAL CARE IF:   You have pain that radiates from your back into your legs.  You develop new bowel or bladder control problems.  You have unusual weakness or numbness in your arms or legs.  You develop nausea or vomiting.  You develop abdominal pain.  You feel faint. Document Released: 04/03/2005 Document Revised: 10/03/2011 Document Reviewed: 08/05/2013 [facility] Patient Information 2015 [place], [place]. This information is not intended to replace advice given to you by your health care provider. Make sure you discuss any questions you have with your health care provider.

## 2014-02-02 ENCOUNTER — Telehealth: Payer: Self-pay

## 2014-02-02 NOTE — Telephone Encounter (Signed)
Dr. Carlota Raspberry, Patient is ready to take next step after taking medications and conservative therapy. He says that his pain is much worse and has not had improvement this past month. Patient says he can't walk any longer and right side pain either back or hip or leg is causing issues and pain. He says he was diagnosed with a herniated disc. He would like to proceed with next step like a referral or Ct-scan that you both discussed. Please advise. Patient has also ran out of his medication for hydrocodone. He would like a refill if possible. Please advise if he needs to RTC or if this can be done.   Pharmacy: [facility] [address]   Best: [phone]

## 2014-02-02 NOTE — Telephone Encounter (Signed)
Spoke to pt advised to RTC to discuss treatment options since pain is worse.

## 2014-02-03 ENCOUNTER — Ambulatory Visit (INDEPENDENT_AMBULATORY_CARE_PROVIDER_SITE_OTHER): Payer: Medicare Other

## 2014-02-03 ENCOUNTER — Ambulatory Visit (INDEPENDENT_AMBULATORY_CARE_PROVIDER_SITE_OTHER): Payer: Medicare Other | Admitting: Internal Medicine

## 2014-02-03 VITALS — BP 120/70 | HR 84 | Temp 97.6°F | Resp 18 | Ht 66.0 in | Wt 198.0 lb

## 2014-02-03 DIAGNOSIS — R292 Abnormal reflex: Secondary | ICD-10-CM

## 2014-02-03 DIAGNOSIS — R208 Other disturbances of skin sensation: Secondary | ICD-10-CM

## 2014-02-03 DIAGNOSIS — M5441 Lumbago with sciatica, right side: Secondary | ICD-10-CM

## 2014-02-03 DIAGNOSIS — R200 Anesthesia of skin: Secondary | ICD-10-CM

## 2014-02-03 DIAGNOSIS — M25551 Pain in right hip: Secondary | ICD-10-CM

## 2014-02-03 DIAGNOSIS — R29898 Other symptoms and signs involving the musculoskeletal system: Secondary | ICD-10-CM

## 2014-02-03 MED ORDER — HYDROCODONE-ACETAMINOPHEN 10-325 MG PO TABS: 1.0000 | ORAL_TABLET | Freq: Four times a day (QID) | ORAL | Status: DC | PRN

## 2014-02-03 MED ORDER — GABAPENTIN 300 MG PO CAPS: 300.0000 mg | ORAL_CAPSULE | Freq: Every day | ORAL | Status: DC

## 2014-02-03 MED ORDER — CLOTRIMAZOLE-BETAMETHASONE 1-0.05 % EX CREA: 1.0000 "application " | TOPICAL_CREAM | Freq: Two times a day (BID) | CUTANEOUS | Status: DC

## 2014-02-03 MED ORDER — PREDNISONE 20 MG PO TABS: ORAL_TABLET | ORAL | Status: DC

## 2014-02-03 NOTE — Progress Notes (Signed)
   Subjective:    Patient ID: Henry [name], male    DOB: 01-Jun-1945, 68 y.o.   MRN: [id number]  HPIsee last OV w/ Dr Carlota Raspberry where a new finding of loss of patellar reflex suspected secondary to L4 herniated disc was documented but all referrals for care were postponed He responded somewhat to prednisone but has steadily worsened since completing treatment. His pain meds and is no longer adequate to control discomfort. He continues with lumbar pain and now has more right hip pain and right groin pain.--R hip and leg giving out when walking//back pain increasing--radic to R foot.No bowel or bladder incontinence.   Numbness continues to increase all way to foot History of Fusion lumbar 63yr--mostly stable since then  S/p ankle repl 2014-Dr Tama Headings results  Patient Active Problem List   Diagnosis Date Noted  . ARTERIOVENOUS MALFORMATION 07/16/2009  . NONSPECIFIC ABN FINDING RAD & OTH EXAM GI TRACT 06/11/2009  . VOMITING ALONE 06/07/2009  . ABDOMINAL PAIN OTHER SPECIFIED SITE 06/07/2009  . DEPRESSION 07/19/2007  . HYPERTENSION 07/19/2007  . DIVERTICULITIS, HX OF 07/19/2007   no recent changes in medicines  Review of Systems Treating self for hemorrhoids although he describes more of an irritation with itching perirectally but responds to steroids temporarily and started after his dosing with prednisone. He has no trouble with bowel movements  Denies dysuria frequency or urgency History of BPH    Objective:   Physical Exam BP 120/70  Pulse 84  Temp(Src) 97.6 F (36.4 C) (Oral)  Resp 18  Ht 5\' 6"  (1.676 m)  Wt 198 lb (89.812 kg)  BMI 31.97 kg/m2  SpO2 99% He is in obvious distress with his discomfort and has a walker which she uses for support  He is tender over the lumbar area but not the right SI joint He is very tender over the right lateral hip with pain on hip external rotation and internal rotation He has a brace on his right knee but it is not swollen. He has  pain with flexion but no ligament instability with stressing Straight leg raise is positive on the right at 30 He has no patellar reflex Toe up strong but no ability to push or pull with  lower leg Decreased sensation right lower extremity compared to left      [facility] reading (PRIMARY) by  Dr. Laney Pastor degen changes R hip.   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 HYDROcodone-acetaminophen (NORCO) 10-325 MG per tablet    Sig: Take 1 tablet by mouth every 6 (six) hours as needed.    Dispense:  120 tablet    Refill:  0  . predniSONE (DELTASONE) 20 MG tablet    Sig: 4/3/3/2/2/1/1 single daily dose for 7 days    Dispense:  16 tablet    Refill:  0  . clotrimazole-betamethasone (LOTRISONE) cream--- for anal irritation     Sig: Apply 1 application topically 2 (two) times daily.    Dispense:  30 g    Refill:  0    -  Neurontin 300 mg at bedtime  Referred to Dr. Ronnald Ramp

## 2014-02-04 ENCOUNTER — Other Ambulatory Visit: Payer: Self-pay | Admitting: Radiology

## 2014-02-04 DIAGNOSIS — M5441 Lumbago with sciatica, right side: Secondary | ICD-10-CM

## 2014-02-09 ENCOUNTER — Ambulatory Visit
Admission: RE | Admit: 2014-02-09 | Discharge: 2014-02-09 | Disposition: A | Discharge status: Home / Self Care | Payer: Medicare Other | Source: Ambulatory Visit | Attending: Internal Medicine | Admitting: Internal Medicine

## 2014-02-09 ENCOUNTER — Other Ambulatory Visit: Payer: Self-pay | Admitting: Internal Medicine

## 2014-02-09 VITALS — BP 103/54 | HR 69

## 2014-02-09 DIAGNOSIS — M5441 Lumbago with sciatica, right side: Secondary | ICD-10-CM

## 2014-02-09 MED ORDER — HYDROCODONE-ACETAMINOPHEN 5-325 MG PO TABS
2.0000 | ORAL_TABLET | Freq: Once | ORAL | Status: AC
  Administered 2014-02-09: 2 via ORAL

## 2014-02-09 MED ORDER — IOHEXOL 180 MG/ML  SOLN
15.0000 mL | Freq: Once | INTRAMUSCULAR | Status: AC | PRN
  Administered 2014-02-09: 15 mL via INTRATHECAL

## 2014-02-09 MED ORDER — DIAZEPAM 5 MG PO TABS
5.0000 mg | ORAL_TABLET | Freq: Once | ORAL | Status: AC
  Administered 2014-02-09: 5 mg via ORAL

## 2014-02-09 MED ORDER — MEPERIDINE HCL 100 MG/ML IJ SOLN
75.0000 mg | Freq: Once | INTRAMUSCULAR | Status: AC
  Administered 2014-02-09: 75 mg via INTRAMUSCULAR

## 2014-02-09 MED ORDER — ONDANSETRON HCL 4 MG/2ML IJ SOLN
4.0000 mg | Freq: Once | INTRAMUSCULAR | Status: AC
  Administered 2014-02-09: 4 mg via INTRAMUSCULAR

## 2014-02-09 NOTE — Discharge Instructions (Signed)
Myelogram Discharge Instructions  1. Go home and rest quietly for the next 24 hours.  It is important to lie flat for the next 24 hours.  Get up only to go to the restroom.  You may lie in the bed or on a couch on your back, your stomach, your left side or your right side.  You may have one pillow under your head.  You may have pillows between your knees while you are on your side or under your knees while you are on your back.  2. DO NOT drive today.  Recline the seat as far back as it will go, while still wearing your seat belt, on the way home.  3. You may get up to go to the bathroom as needed.  You may sit up for 10 minutes to eat.  You may resume your normal diet and medications unless otherwise indicated.  Drink lots of extra fluids today and tomorrow.  4. The incidence of headache, nausea, or vomiting is about 5% (one in 20 patients).  If you develop a headache, lie flat and drink plenty of fluids until the headache goes away.  Caffeinated beverages may be helpful.  If you develop severe nausea and vomiting or a headache that does not go away with flat bed rest, call [phone].  5. You may resume normal activities after your 24 hours of bed rest is over; however, do not exert yourself strongly or do any heavy lifting tomorrow. If when you get up you have a headache when standing, go back to bed and force fluids for another 24 hours.  6. Call your physician for a follow-up appointment.  The results of your myelogram will be sent directly to your physician by the following day.  7. If you have any questions or if complications develop after you arrive home, please call [phone].  Discharge instructions have been explained to the patient.  The patient, or the person responsible for the patient, fully understands these instructions.      May resume Trazodone on Oct. 25, 2015, after 11:30 am.

## 2014-02-09 NOTE — Progress Notes (Signed)
Pt states he has been off trazodone for the past 2 days.  Discharge instructions explained to pt and wife

## 2014-02-11 ENCOUNTER — Other Ambulatory Visit (HOSPITAL_COMMUNITY): Payer: Medicare Other

## 2014-02-11 ENCOUNTER — Ambulatory Visit (HOSPITAL_COMMUNITY): Payer: Medicare Other

## 2014-02-18 ENCOUNTER — Ambulatory Visit: Payer: Medicare Other | Admitting: Family Medicine

## 2014-02-20 ENCOUNTER — Emergency Department (HOSPITAL_COMMUNITY): Payer: Medicare Other

## 2014-02-20 ENCOUNTER — Encounter (HOSPITAL_COMMUNITY): Payer: Self-pay | Admitting: Emergency Medicine

## 2014-02-20 ENCOUNTER — Emergency Department (HOSPITAL_COMMUNITY)
Admission: EM | Admit: 2014-02-20 | Discharge: 2014-02-20 | Disposition: A | Discharge status: Home / Self Care | Payer: Medicare Other | Attending: Emergency Medicine | Admitting: Emergency Medicine

## 2014-02-20 DIAGNOSIS — M1990 Unspecified osteoarthritis, unspecified site: Secondary | ICD-10-CM | POA: Diagnosis not present

## 2014-02-20 DIAGNOSIS — Z7952 Long term (current) use of systemic steroids: Secondary | ICD-10-CM | POA: Insufficient documentation

## 2014-02-20 DIAGNOSIS — R5383 Other fatigue: Secondary | ICD-10-CM | POA: Diagnosis not present

## 2014-02-20 DIAGNOSIS — R110 Nausea: Secondary | ICD-10-CM | POA: Insufficient documentation

## 2014-02-20 DIAGNOSIS — R509 Fever, unspecified: Secondary | ICD-10-CM | POA: Diagnosis not present

## 2014-02-20 DIAGNOSIS — R5381 Other malaise: Secondary | ICD-10-CM

## 2014-02-20 DIAGNOSIS — Z791 Long term (current) use of non-steroidal anti-inflammatories (NSAID): Secondary | ICD-10-CM | POA: Diagnosis not present

## 2014-02-20 DIAGNOSIS — I10 Essential (primary) hypertension: Secondary | ICD-10-CM | POA: Diagnosis not present

## 2014-02-20 DIAGNOSIS — R531 Weakness: Secondary | ICD-10-CM | POA: Insufficient documentation

## 2014-02-20 DIAGNOSIS — Z79899 Other long term (current) drug therapy: Secondary | ICD-10-CM | POA: Diagnosis not present

## 2014-02-20 LAB — URINALYSIS, ROUTINE W REFLEX MICROSCOPIC
Bilirubin Urine: NEGATIVE
Glucose, UA: NEGATIVE mg/dL
Hgb urine dipstick: NEGATIVE
Ketones, ur: NEGATIVE mg/dL
Leukocytes, UA: NEGATIVE
Nitrite: NEGATIVE
Protein, ur: NEGATIVE mg/dL
Specific Gravity, Urine: 1.016 (ref 1.005–1.030)
Urobilinogen, UA: 0.2 mg/dL (ref 0.0–1.0)
pH: 7 (ref 5.0–8.0)

## 2014-02-20 LAB — COMPREHENSIVE METABOLIC PANEL
ALT: 34 U/L (ref 0–53)
AST: 20 U/L (ref 0–37)
Albumin: 3.5 g/dL (ref 3.5–5.2)
Alkaline Phosphatase: 123 U/L — ABNORMAL HIGH (ref 39–117)
Anion gap: 17 — ABNORMAL HIGH (ref 5–15)
BUN: 20 mg/dL (ref 6–23)
CO2: 22 mEq/L (ref 19–32)
Calcium: 10 mg/dL (ref 8.4–10.5)
Chloride: 95 mEq/L — ABNORMAL LOW (ref 96–112)
Creatinine, Ser: 1.06 mg/dL (ref 0.50–1.35)
GFR calc Af Amer: 83 mL/min — ABNORMAL LOW (ref >90)
GFR calc non Af Amer: 71 mL/min — ABNORMAL LOW (ref >90)
Glucose, Bld: 97 mg/dL (ref 70–99)
Potassium: 4.5 mEq/L (ref 3.7–5.3)
Sodium: 134 mEq/L — ABNORMAL LOW (ref 137–147)
Total Bilirubin: 1.4 mg/dL — ABNORMAL HIGH (ref 0.3–1.2)
Total Protein: 7.8 g/dL (ref 6.0–8.3)

## 2014-02-20 LAB — CBC WITH DIFFERENTIAL/PLATELET
Basophils Absolute: 0 10*3/uL (ref 0.0–0.1)
Basophils Relative: 0 % (ref 0–1)
Eosinophils Absolute: 0.2 10*3/uL (ref 0.0–0.7)
Eosinophils Relative: 2 % (ref 0–5)
HCT: 39.2 % (ref 39.0–52.0)
Hemoglobin: 12.9 g/dL — ABNORMAL LOW (ref 13.0–17.0)
Lymphocytes Relative: 15 % (ref 12–46)
Lymphs Abs: 1.5 10*3/uL (ref 0.7–4.0)
MCH: 25.1 pg — ABNORMAL LOW (ref 26.0–34.0)
MCHC: 32.9 g/dL (ref 30.0–36.0)
MCV: 76.4 fL — ABNORMAL LOW (ref 78.0–100.0)
Monocytes Absolute: 1.1 10*3/uL — ABNORMAL HIGH (ref 0.1–1.0)
Monocytes Relative: 11 % (ref 3–12)
Neutro Abs: 7.2 10*3/uL (ref 1.7–7.7)
Neutrophils Relative %: 72 % (ref 43–77)
Platelets: 436 10*3/uL — ABNORMAL HIGH (ref 150–400)
RBC: 5.13 MIL/uL (ref 4.22–5.81)
RDW: 15.2 % (ref 11.5–15.5)
WBC: 10 10*3/uL (ref 4.0–10.5)

## 2014-02-20 MED ORDER — SODIUM CHLORIDE 0.9 % IV BOLUS (SEPSIS)
1000.0000 mL | Freq: Once | INTRAVENOUS | Status: AC
  Administered 2014-02-20: 1000 mL via INTRAVENOUS

## 2014-02-20 NOTE — Discharge Instructions (Signed)

## 2014-02-20 NOTE — ED Provider Notes (Signed)
CSN: [id number]     Arrival date & time 02/20/14  9470 History   First MD Initiated Contact with Patient 02/20/14 [phone]     Chief Complaint  Patient presents with  . Fever  . Weakness     (Consider location/radiation/quality/duration/timing/severity/associated sxs/prior Treatment) Patient is a 68 y.o. male presenting with fever and weakness.  Fever Max temp prior to arrival:  101 five days ago, 94 today Temp source:  Oral Severity:  Severe Onset quality:  Gradual Duration:  7 days Timing:  Constant Progression:  Worsening Chronicity:  New Relieved by:  Ibuprofen Worsened by:  Nothing tried Associated symptoms: chills, cough and nausea   Associated symptoms: no chest pain, no diarrhea, no dysuria (positive urinary frequency) and no vomiting   Associated symptoms comment:  Generalized weakness, fatigue, malaise.  No leg weakness.   Weakness Pertinent negatives include no chest pain.    Past Medical History  Diagnosis Date  . Arthritis   . Hypertension    Past Surgical History  Procedure Laterality Date  . 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 Cholecystectomy    . Colon surgery    . Spine surgery    . Fracture surgery      left shoulder   No family history on file. History  Substance Use Topics  . Smoking status: Never Smoker   . Smokeless tobacco: Not on file  . Alcohol Use: Not on file    Review of Systems  Constitutional: Positive for fever and chills.  Respiratory: Positive for cough.   Cardiovascular: Negative for chest pain.  Gastrointestinal: Positive for nausea. Negative for vomiting and diarrhea.  Genitourinary: Negative for dysuria (positive urinary frequency).  Neurological: Positive for weakness.  All other systems reviewed and are negative.     Allergies  Ciprofloxacin  Home Medications   Prior to Admission medications   Medication Sig Start Date End Date Taking? Authorizing Provider  benazepril-hydrochlorthiazide (LOTENSIN HCT)  20-25 MG per tablet Take 1 tablet by mouth daily. Need office visit for additional refills. Second Notice. 10/08/13   Posey Boyer, MD  benazepril-hydrochlorthiazide (LOTENSIN HCT) 20-25 MG per tablet TAKE 1 TABLET BY MOUTH DAILY. NEED OFFICE VISIT FOR ADDITIONAL REFILLS. SECOND NOTICE.    Mancel Bale, PA-C  celecoxib (CELEBREX) 200 MG capsule Take one twice daily if needed for arthritic pain 10/08/13   Posey Boyer, MD  cholecalciferol (VITAMIN D) 1000 UNITS tablet Take 1,000 Units by mouth daily.    Historical Provider, MD  clotrimazole-betamethasone (LOTRISONE) cream Apply 1 application topically 2 (two) times daily. 02/03/14   Leandrew Koyanagi, MD  fish oil-omega-3 fatty acids 1000 MG capsule Take 2 g by mouth daily.    Historical Provider, MD  gabapentin (NEURONTIN) 300 MG capsule Take 1 capsule (300 mg total) by mouth at bedtime. 02/03/14   Leandrew Koyanagi, MD  HYDROcodone-acetaminophen (Arnold) 10-325 MG per tablet Take 1 tablet by mouth every 6 (six) hours as needed. 02/03/14   Leandrew Koyanagi, MD  hydrOXYzine (VISTARIL) 50 MG capsule Take on about 1/2 hr before bedtime for sleep 12/19/11   Posey Boyer, MD  levothyroxine (SYNTHROID, LEVOTHROID) 125 MCG tablet Take 1 tablet (125 mcg total) by mouth daily before breakfast. Need office visit and labs for additional refills. 10/08/13   Posey Boyer, MD  lidocaine (LIDODERM) 5 % Place 1 patch onto the skin daily as needed. Remove & Discard patch within 12 hours or as directed by MD 04/24/13  Posey Boyer, MD  LORazepam (ATIVAN) 1 MG tablet 1/2-1 daily as needed 10/08/13   Posey Boyer, MD  naproxen (NAPROSYN) 250 MG tablet Take 250 mg by mouth 2 (two) times daily with a meal.    Historical Provider, MD  ondansetron (ZOFRAN) 4 MG tablet Take 1 tablet (4 mg total) by mouth every 8 (eight) hours as needed for nausea. 07/10/12   Lyndal Pulley, DO  predniSONE (DELTASONE) 20 MG tablet 4/3/3/2/2/1/1 single daily dose for 7 days 02/03/14    Leandrew Koyanagi, MD  tamsulosin (FLOMAX) 0.4 MG CAPS capsule Take 1 capsule (0.4 mg total) by mouth daily. 10/08/13   Posey Boyer, MD  traZODone (DESYREL) 50 MG tablet Take 0.5-1 tablets (25-50 mg total) by mouth at bedtime as needed for sleep. 06/14/11 07/14/11  Posey Boyer, MD  zolpidem (AMBIEN) 10 MG tablet Take 1 tablet (10 mg total) by mouth at bedtime as needed for sleep. 10/08/13   Posey Boyer, MD   BP 116/74 mmHg  Pulse 90  Temp(Src) 99.8 F (37.7 C) (Rectal)  Resp 12  Wt 195 lb (88.451 kg)  SpO2 99% Physical Exam  Constitutional: He is oriented to person, place, and time. He appears well-developed and well-nourished. No distress.  HENT:  Head: Normocephalic and atraumatic.  Mouth/Throat: Oropharynx is clear and moist.  Eyes: Conjunctivae are normal. Pupils are equal, round, and reactive to light. No scleral icterus.  Neck: Neck supple.  Cardiovascular: Normal rate, regular rhythm, normal heart sounds and intact distal pulses.   No murmur heard. Pulmonary/Chest: Effort normal and breath sounds normal. No stridor. No respiratory distress. He has no wheezes. He has no rales.  Abdominal: Soft. He exhibits no distension. There is generalized tenderness. There is no rigidity, no rebound and no guarding.  Multiple surgical scars.  Generalized abdominal tenderness which is described as consistent with chronic abdominal tenderness.  Musculoskeletal: Normal range of motion. He exhibits no edema.  Low back incision is clean, dry, intact, nonfluctuant, no surrounding erythema, no drainage.    Neurological: He is alert and oriented to person, place, and time.  Reflex Scores:      Patellar reflexes are 0 on the right side and 1+ on the left side.      Achilles reflexes are 2+ on the left side. Normal lower extremity strength.  Decreased reflexes on RLE - described as chronic.  Normal BLE sensation.    Skin: Skin is warm and dry. No rash noted.  Psychiatric: He has a normal mood and  affect. His behavior is normal.  Nursing note and vitals reviewed.   ED Course  Procedures (including critical care time) Labs Review Labs Reviewed  CBC WITH DIFFERENTIAL - Abnormal; Notable for the following:    Hemoglobin 12.9 (*)    MCV 76.4 (*)    MCH 25.1 (*)    Platelets 436 (*)    Monocytes Absolute 1.1 (*)    All other components within normal limits  COMPREHENSIVE METABOLIC PANEL - Abnormal; Notable for the following:    Sodium 134 (*)    Chloride 95 (*)    Alkaline Phosphatase 123 (*)    Total Bilirubin 1.4 (*)    GFR calc non Af Amer 71 (*)    GFR calc Af Amer 83 (*)    Anion gap 17 (*)    All other components within normal limits  URINALYSIS, ROUTINE W REFLEX MICROSCOPIC    Imaging Review Dg Chest 2 View  02/20/2014   CLINICAL DATA:  Back surgery on 02/13/2014. Fever. Headache, chills. Weakness. Low temperature at 95 degrees. History of hypertension. Nonsmoker.  EXAM: CHEST  2 VIEW  COMPARISON:  None.  FINDINGS: The heart size and mediastinal contours are within normal limits. Both lungs are clear. The visualized skeletal structures are unremarkable.  IMPRESSION: No active cardiopulmonary disease.   Electronically Signed   By: Shon Hale M.D.   On: 02/20/2014 11:18  All radiology studies independently viewed by me.      EKG Interpretation None      MDM   Final diagnoses:  Fever  Malaise    68 yo male with recent discectomy presenting with malaise and reports of fevers at home.  He had a temp of 101 the day after surgery.  Today, he reports his temperature was 94.  On exam, he was well appearing, nontoxic.  Back incision was well appearing with minimal tenderness, no drainage, no swelling, no redness.  He had a headache several days ago, but it has nearly resolved now.  No meningeal signs on exam.  His abdomen was soft but tender.  However, he reported that his abdomen is always tender and it is unchanged from his usual.    Ultimately, his workup was  reassuring without signs of significant infection.  I discussed his case with Dr. Ronnald Ramp, who was reassured about the appearance of his surgical incision.  Pt was very frustrated that his workup did not show why he doesn't feel well.  I advised him to monitor his symptoms, contact his doctors or return to the ED if they changed or worsened, and to follow up as scheduled.      Artis Delay, MD 02/20/14 [phone]

## 2014-02-20 NOTE — ED Notes (Addendum)
States had back surgery 10/30./15 and then he got a fever that he could not shake now he is having h/a chills and temp in really low now at 95  pt feels weak. Called his dr and was told to come here. states surgery went longer than it was suppose to and they say he leaked some CSF

## 2014-02-20 NOTE — ED Notes (Signed)
Patients back dressing to the lower lumbar was removed and changes with a gauze dressing.

## 2014-02-20 NOTE — ED Notes (Signed)
Patient transported to X-ray 

## 2014-02-20 NOTE — ED Notes (Signed)
Dr. Bartholome Bill at bedside.

## 2014-02-20 NOTE — ED Notes (Signed)
Patient c/o of being really fatigued, sleeping a lot, fluctuation in temperatures.  Nauseated with no vomiting.  Pt states he has been urinating small quantities with frequent urination, denies burning.  Denies chest pains.  Abdominal tenderness with palpation.  Wakes up with runny noses, is using an incentive spirometer at home.

## 2014-02-20 NOTE — ED Notes (Signed)
Dr. Bartholome Bill at bedside.  Speaking with patient and family regarding back surgery and c/o for coming to the ED.

## 2014-02-27 ENCOUNTER — Other Ambulatory Visit: Payer: Self-pay | Admitting: Internal Medicine

## 2014-03-02 NOTE — Telephone Encounter (Signed)
Dr Doolittle, do you want to give RFs? 

## 2014-03-11 ENCOUNTER — Ambulatory Visit (INDEPENDENT_AMBULATORY_CARE_PROVIDER_SITE_OTHER): Payer: Medicare Other

## 2014-03-11 ENCOUNTER — Ambulatory Visit (INDEPENDENT_AMBULATORY_CARE_PROVIDER_SITE_OTHER): Payer: Medicare Other | Admitting: Family Medicine

## 2014-03-11 ENCOUNTER — Encounter: Payer: Self-pay | Admitting: Family Medicine

## 2014-03-11 DIAGNOSIS — R5383 Other fatigue: Secondary | ICD-10-CM

## 2014-03-11 DIAGNOSIS — M51369 Other intervertebral disc degeneration, lumbar region without mention of lumbar back pain or lower extremity pain: Secondary | ICD-10-CM

## 2014-03-11 DIAGNOSIS — R5082 Postprocedural fever: Secondary | ICD-10-CM

## 2014-03-11 DIAGNOSIS — M5136 Other intervertebral disc degeneration, lumbar region: Secondary | ICD-10-CM

## 2014-03-11 LAB — POCT URINALYSIS DIPSTICK
Bilirubin, UA: NEGATIVE
Glucose, UA: NEGATIVE
Ketones, UA: NEGATIVE
Leukocytes, UA: NEGATIVE
Nitrite, UA: NEGATIVE
Protein, UA: NEGATIVE
Spec Grav, UA: 1.01
Urobilinogen, UA: 0.2
pH, UA: 7

## 2014-03-11 LAB — COMPREHENSIVE METABOLIC PANEL
ALT: 14 U/L (ref 0–53)
AST: 11 U/L (ref 0–37)
Albumin: 4.4 g/dL (ref 3.5–5.2)
Alkaline Phosphatase: 70 U/L (ref 39–117)
BUN: 10 mg/dL (ref 6–23)
CO2: 24 mEq/L (ref 19–32)
Calcium: 9.8 mg/dL (ref 8.4–10.5)
Chloride: 102 mEq/L (ref 96–112)
Creat: 1.04 mg/dL (ref 0.50–1.35)
Glucose, Bld: 101 mg/dL — ABNORMAL HIGH (ref 70–99)
Potassium: 4 mEq/L (ref 3.5–5.3)
Sodium: 137 mEq/L (ref 135–145)
Total Bilirubin: 1.1 mg/dL (ref 0.2–1.2)
Total Protein: 7.2 g/dL (ref 6.0–8.3)

## 2014-03-11 LAB — TSH: TSH: 1.454 u[IU]/mL (ref 0.350–4.500)

## 2014-03-11 NOTE — Progress Notes (Signed)
Subjective:    Patient ID: Henry [name], male    DOB: 07-Feb-1946, 68 y.o.   MRN: [id number]  03/11/2014  Establish Care and Fever   HPI This 68 y.o. male presents for evaluation of    1. Fever:  Lower back pain: s/p procedure.  Ran a high fever for five days after surgery.  Then temperature dropped to 94.0 F.  Spoke with Dr. Ronnald Ramp about fever and low temperatures; presented to ED on 02/20/2014.  WBC count 10.0.  Hgb 12.9.  Could not determine why ran temperature. Has been running fever for three weeks.  Dr. Ronnald Ramp wanted extensive labs; ran CBC, ESR, CRP.  Temperature at ortho yesterday was 95.0; repeated x 2 yesterday; remained 95.0.  The highest today is 97.1.  As becomes active, temperature improves.  Becomes totally exhausted and weak.  Energy has been bouncing.  This has occurred since surgery.  When went to emergency room, fatigue has minimally improved.  Surgery 02/13/14.  Then 24 hours after surgery, developed fever 101.9.  Took Advil for fever.  Five days after onset of fever, temperature dropped to low.  Today temperature 98.3.  Did take pain pill yesterday for achiness.  Felt fatigued upon awakening this morning.  No chest pain, palpitations, .  Slight headaches.  During surgery, tapped spine and started leaking spinal fluid and patched puncture; advised that pt would suffer with headaches. He also suffered with massive neck pain and headache.  Headaches and neck pain are gone.  Now has mild headache once every 2-3 days.  Not a headache person.  NS advised that if pt had a leak, CSF fluid would be leaking out of incision.  Near rectum to scrotal sac, has tingling.  Has been rubbing hydrocortisone cream.  Getting better but still bothers pt.  NS mentioned that there could be some nerve irritation in genital region; recommended giving more time.  Weakness is making pt feel poorly.  Mentioned abx to ED and they got upset.  Mentioned to NS and got upset.  Wound did heal from surgery.  Had  surgery [facility] on [facility].  Cannot undergo MRI due to fusion from several years ago.  S/p CT scan of lumbar spine before surgery.  NS did mention that it was too early to repeat CT lumbar spine.  No SOB or cough.  S/p fusion lumbar.  Dr. Ronnald Ramp also mentioned that a prostate process may be causing genital discomfort.  No dysuria, clear, hematuria, nocturia at baseline x 2; urgency, frequency.  No urinary hesitancy.  No bowel incontinence.     Review of Systems  Constitutional: Positive for fever, diaphoresis and fatigue. Negative for chills.  HENT: Negative for congestion, ear pain, postnasal drip, rhinorrhea, sinus pressure and sore throat.   Respiratory: Positive for shortness of breath. Negative for cough, chest tightness, wheezing and stridor.   Cardiovascular: Negative for chest pain, palpitations and leg swelling.  Gastrointestinal: Negative for nausea, vomiting, abdominal pain, diarrhea and constipation.  Endocrine: Negative for cold intolerance, heat intolerance, polydipsia, polyphagia and polyuria.  Genitourinary: Negative for dysuria, urgency, frequency, hematuria, flank pain, discharge, penile swelling, scrotal swelling, genital sores, penile pain and testicular pain.  Musculoskeletal: Positive for back pain. Negative for myalgias, joint swelling, arthralgias, gait problem, neck pain and neck stiffness.  Skin: Negative for color change, rash and wound.  Neurological: Negative for dizziness, tremors, seizures, syncope, facial asymmetry, speech difficulty, weakness, light-headedness, numbness and headaches.    Past Medical History  Diagnosis Date  . Arthritis   . Hypertension   . Hypothyroidism    Past Surgical History  Procedure Laterality Date  . 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 Cholecystectomy    . Colon surgery    . Spine surgery    . Fracture surgery      left shoulder   Allergies  Allergen Reactions  . Ciprofloxacin Itching and Rash     Current Outpatient Prescriptions  Medication Sig Dispense Refill  . benazepril-hydrochlorthiazide (LOTENSIN HCT) 20-25 MG per tablet Take 1 tablet by mouth daily. Need office visit for additional refills. Second Notice. 90 tablet 3  . cholecalciferol (VITAMIN D) 1000 UNITS tablet Take 1,000 Units by mouth daily.    . clotrimazole-betamethasone (LOTRISONE) cream APPLY TO AFFECTED AREA TWICE A DAY 30 g 4  . fish oil-omega-3 fatty acids 1000 MG capsule Take 2 g by mouth daily.    Marland Kitchen HYDROcodone-acetaminophen (NORCO) 10-325 MG per tablet Take 1 tablet by mouth every 6 (six) hours as needed. 120 tablet 0  . levothyroxine (SYNTHROID, LEVOTHROID) 125 MCG tablet Take 1 tablet (125 mcg total) by mouth daily before breakfast. Need office visit and labs for additional refills. 90 tablet 3  . lidocaine (LIDODERM) 5 % Place 1 patch onto the skin daily as needed. Remove & Discard patch within 12 hours or as directed by MD 30 patch 5  . LORazepam (ATIVAN) 1 MG tablet 1/2-1 daily as needed 90 tablet 1  . predniSONE (DELTASONE) 20 MG tablet 4/3/3/2/2/1/1 single daily dose for 7 days 16 tablet 0  . tamsulosin (FLOMAX) 0.4 MG CAPS capsule Take 1 capsule (0.4 mg total) by mouth daily. 90 capsule 3  . zolpidem (AMBIEN) 10 MG tablet Take 1 tablet (10 mg total) by mouth at bedtime as needed for sleep. 30 tablet 5  . benazepril-hydrochlorthiazide (LOTENSIN HCT) 20-25 MG per tablet TAKE 1 TABLET BY MOUTH DAILY. NEED OFFICE VISIT FOR ADDITIONAL REFILLS. SECOND NOTICE. (Patient not taking: Reported on 03/11/2014) 90 tablet 3  . celecoxib (CELEBREX) 200 MG capsule Take one twice daily if needed for arthritic pain (Patient not taking: Reported on 03/11/2014) 60 capsule 2  . gabapentin (NEURONTIN) 300 MG capsule Take 1 capsule (300 mg total) by mouth at bedtime. (Patient not taking: Reported on 03/11/2014) 30 capsule 3  . hydrOXYzine (VISTARIL) 50 MG capsule Take on about 1/2 hr before bedtime for sleep (Patient not taking:  Reported on 03/11/2014) 30 capsule 0  . naproxen (NAPROSYN) 250 MG tablet Take 250 mg by mouth 2 (two) times daily with a meal.    . ondansetron (ZOFRAN) 4 MG tablet Take 1 tablet (4 mg total) by mouth every 8 (eight) hours as needed for nausea. (Patient not taking: Reported on 03/11/2014) 20 tablet 0  . traZODone (DESYREL) 50 MG tablet Take 0.5-1 tablets (25-50 mg total) by mouth at bedtime as needed for sleep. 30 tablet 3   No current facility-administered medications for this visit.       Objective:    There were no vitals taken for this visit. Physical Exam  Constitutional: He is oriented to person, place, and time. He appears well-developed and well-nourished. No distress.  HENT:  Head: Normocephalic and atraumatic.  Right Ear: External ear normal.  Left Ear: External ear normal.  Nose: Nose normal.  Mouth/Throat: Oropharynx is clear and moist.  Eyes: Conjunctivae and EOM are normal. Pupils are equal, round, and reactive to light.  Neck: Normal range of motion. Neck supple. Carotid bruit  is not present. No thyromegaly present.  Cardiovascular: Normal rate, regular rhythm, normal heart sounds and intact distal pulses.  Exam reveals no gallop and no friction rub.   No murmur heard. Pulmonary/Chest: Effort normal and breath sounds normal. He has no wheezes. He has no rales.  Abdominal: Soft. Bowel sounds are normal. He exhibits no distension and no mass. There is no tenderness. There is no rebound and no guarding. Hernia confirmed negative in the right inguinal area and confirmed negative in the left inguinal area.  Genitourinary: Rectum normal, prostate normal and penis normal. Prostate is not tender. Right testis shows no mass, no swelling and no tenderness. Left testis shows no mass, no swelling and no tenderness.  Musculoskeletal:  Well healed incision midline lumbar spine.  Lymphadenopathy:    He has no cervical adenopathy.       Right: No inguinal adenopathy present.        Left: No inguinal adenopathy present.  Neurological: He is alert and oriented to person, place, and time. No cranial nerve deficit.  Skin: Skin is warm and dry. No rash noted. He is not diaphoretic.  Psychiatric: He has a normal mood and affect. His behavior is normal.  Nursing note and vitals reviewed.  Results for orders placed or performed in visit on 03/11/14  Urine culture  Result Value Ref Range   Colony Count NO GROWTH    Organism ID, Bacteria NO GROWTH   Comprehensive metabolic panel  Result Value Ref Range   Sodium 137 135 - 145 mEq/L   Potassium 4.0 3.5 - 5.3 mEq/L   Chloride 102 96 - 112 mEq/L   CO2 24 19 - 32 mEq/L   Glucose, Bld 101 (H) 70 - 99 mg/dL   BUN 10 6 - 23 mg/dL   Creat 1.04 0.50 - 1.35 mg/dL   Total Bilirubin 1.1 0.2 - 1.2 mg/dL   Alkaline Phosphatase 70 39 - 117 U/L   AST 11 0 - 37 U/L   ALT 14 0 - 53 U/L   Total Protein 7.2 6.0 - 8.3 g/dL   Albumin 4.4 3.5 - 5.2 g/dL   Calcium 9.8 8.4 - 10.5 mg/dL  TSH  Result Value Ref Range   TSH 1.454 0.350 - 4.500 uIU/mL  PSA  Result Value Ref Range   PSA 2.11 <=4.00 ng/mL  POCT urinalysis dipstick  Result Value Ref Range   Color, UA yellow    Clarity, UA clear    Glucose, UA neg    Bilirubin, UA neg    Ketones, UA neg    Spec Grav, UA 1.010    Blood, UA trace    pH, UA 7.0    Protein, UA neg    Urobilinogen, UA 0.2    Nitrite, UA neg    Leukocytes, UA Negative    [facility] reading (PRIMARY) by  Dr. Tamala Julian.  LUMBAR SPINE FILMS: HARDWARE IN PLACE; NO ACUTE PROCESS.      Assessment & Plan:   1. Other fatigue   2. Postprocedural fever   3. Degenerative disc disease, lumbar      1. Malaise and fatigue:  New. Onset immediately following lumbar surgery by Dr. Ronnald Ramp; obtain labs; obtain records from Dr. Ronnald Ramp of [facility].  Recent CBC, ESR, CRP obtained.  Recommend supportive care with rest, fluids.  Close follow-up in one week. 2.  Postprocedure fever followed by low temperature readings:  New.  Obtain labs from  Dr. Ronnald Ramp drawn yesterday. ED visit on 11//15 with negative  CXR, normal CBC, benign urine. Obtain urine culture, CMET today.  If persists, consider ID consultation and consultation with Dr. Hassel Neth.  S/p lumbar films today in office negative for acute process. 3.  DDD lumbar spine: s/p surgery in past three weeks with post-surgical fever, malaise/fatigue.  Back pain much improved s/p surgery.    No orders of the defined types were placed in this encounter.    Return in about 5 days (around 03/16/2014) for recheck.    Reginia Forts, M.D.  [facility] [address] [place], [place]  [address] [phone] phone [phone] fax

## 2014-03-12 LAB — PSA: PSA: 2.11 ng/mL (ref <=4.00)

## 2014-03-12 LAB — URINE CULTURE
Colony Count: NO GROWTH
Organism ID, Bacteria: NO GROWTH

## 2014-03-14 ENCOUNTER — Encounter: Payer: Self-pay | Admitting: Family Medicine

## 2014-03-16 ENCOUNTER — Encounter: Payer: Self-pay | Admitting: Family Medicine

## 2014-03-16 ENCOUNTER — Ambulatory Visit (INDEPENDENT_AMBULATORY_CARE_PROVIDER_SITE_OTHER): Payer: Medicare Other | Admitting: Family Medicine

## 2014-03-16 VITALS — BP 126/68 | HR 80 | Temp 97.5°F | Resp 16 | Ht 65.5 in | Wt 195.0 lb

## 2014-03-16 DIAGNOSIS — G894 Chronic pain syndrome: Secondary | ICD-10-CM

## 2014-03-16 DIAGNOSIS — R5082 Postprocedural fever: Secondary | ICD-10-CM

## 2014-03-16 DIAGNOSIS — G4700 Insomnia, unspecified: Secondary | ICD-10-CM | POA: Insufficient documentation

## 2014-03-16 DIAGNOSIS — R5383 Other fatigue: Secondary | ICD-10-CM

## 2014-03-16 DIAGNOSIS — F411 Generalized anxiety disorder: Secondary | ICD-10-CM

## 2014-03-16 DIAGNOSIS — N400 Enlarged prostate without lower urinary tract symptoms: Secondary | ICD-10-CM

## 2014-03-16 DIAGNOSIS — I10 Essential (primary) hypertension: Secondary | ICD-10-CM

## 2014-03-16 DIAGNOSIS — E039 Hypothyroidism, unspecified: Secondary | ICD-10-CM

## 2014-03-16 DIAGNOSIS — M542 Cervicalgia: Secondary | ICD-10-CM

## 2014-03-16 DIAGNOSIS — M51369 Other intervertebral disc degeneration, lumbar region without mention of lumbar back pain or lower extremity pain: Secondary | ICD-10-CM

## 2014-03-16 DIAGNOSIS — M5136 Other intervertebral disc degeneration, lumbar region: Secondary | ICD-10-CM | POA: Insufficient documentation

## 2014-03-16 MED ORDER — OXYCODONE-ACETAMINOPHEN 5-325 MG PO TABS: 1.0000 | ORAL_TABLET | Freq: Three times a day (TID) | ORAL | Status: DC | PRN

## 2014-03-16 MED ORDER — ZOLPIDEM TARTRATE 10 MG PO TABS: 10.0000 mg | ORAL_TABLET | Freq: Every evening | ORAL | Status: DC | PRN

## 2014-03-16 MED ORDER — LORAZEPAM 1 MG PO TABS: 1.0000 mg | ORAL_TABLET | Freq: Every day | ORAL | Status: DC | PRN

## 2014-03-16 NOTE — Progress Notes (Signed)
Subjective:    Patient ID: Henry [name], male    DOB: [birth year], 68 y.o.   MRN: [id number]  03/16/2014  Follow-up   HPI This 68 y.o. male presents for five day follow-up of the following:  1.  Fatigue and malaise: 30% improved from last visit.  Energy level starting improving over the weekend. No new symptoms from last week.  Temperatures have also normalized over past five days. Now temperature running in 97s.  No fever or low temperature readings.  Feeling better.  No back pain; having some L hip pain towards the end of the day; all pain seems to worsen towards end of the day.  2. Insomnia: takes 1/2 Ambien qhs; takes the other 1/2 when awakens to urinate.  Needs refill.  Really difficult to sleep.  Chronic issue for patient; requesting higher dose of Ambien.    2.  Anxiety:  Chronic intermittent issue for patient; feels anxiety triggered by chronic pain syndrome.  One Lorazepam every morning; 1/2 every evening.  Dealing with a lot of pain which is causing anxiety.  Chronic pain affects pt emotionally.  Has not tried anything else for anxiety.  Not taking Lorazepam every day; may not take Lorazepam for a week at at time.  With recent surgery, taking Lorazepam more frequently.  Lorazepam working well but feels that need something stronger for anxiety; requesting stronger dose of Lorazepam.   3.  Chronic pain syndrome: +L shoulder chronic pain with wires and screws; +lower back but this has greatly improved since recent surgery; +s/p ankle replacement; +abdomen chronic pain.   Wears corsett for support of abdomen with hernias.  Not interested in Oxycontin or stronger medication.  Taking Hydrocodone only when needed.  Can go three days without hurting enough to take Hydrocodone.  Some days pain is so severe, takes two Hydrocodone daily.   Pain medications lead to constipation and patient must be cautious with GI issues due to history of colectomy.  Took Demerol during admission with previous  back surgery.  Dr. Hassel Neth prescribed Hydrocodone 10/371m after recent surgery.  When patient takes hydrocodone 151m pt does not feel any better.  Has been very resistant to going to pain clinic in past. Dr. HoLinna Darnergreed to prescribe pain medication as long as pain remained stable; Dr. HoLinna Darnerdvised that pt would need referral if warranted more for pain.     Review of Systems  Constitutional: Negative for fever, chills, diaphoresis, activity change, appetite change and fatigue.  Eyes: Negative for visual disturbance.  Respiratory: Negative for cough and shortness of breath.   Cardiovascular: Negative for chest pain, palpitations and leg swelling.  Gastrointestinal: Positive for abdominal pain. Negative for nausea, vomiting, diarrhea, constipation, blood in stool, abdominal distention, anal bleeding and rectal pain.  Endocrine: Negative for cold intolerance, heat intolerance, polydipsia, polyphagia and polyuria.  Genitourinary: Negative for dysuria, urgency, frequency, hematuria and genital sores.  Musculoskeletal: Positive for myalgias, back pain, arthralgias and gait problem. Negative for joint swelling.  Skin: Negative for color change, pallor, rash and wound.  Neurological: Negative for dizziness, tremors, seizures, syncope, facial asymmetry, speech difficulty, weakness, light-headedness, numbness and headaches.  Psychiatric/Behavioral: Positive for sleep disturbance. Negative for dysphoric mood. The patient is nervous/anxious.     Past Medical History  Diagnosis Date  . Arthritis   . Hypertension   . Hypothyroidism    Past Surgical History  Procedure Laterality Date  . 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 Cholecystectomy    . Colon  surgery    . Spine surgery    . Fracture surgery      left shoulder   Allergies  Allergen Reactions  . Ciprofloxacin Itching and Rash   Current Outpatient Prescriptions  Medication Sig Dispense Refill  . benazepril-hydrochlorthiazide  (LOTENSIN HCT) 20-25 MG per tablet Take 1 tablet by mouth daily. Need office visit for additional refills. Second Notice. 90 tablet 3  . cholecalciferol (VITAMIN D) 1000 UNITS tablet Take 1,000 Units by mouth daily.    . clotrimazole-betamethasone (LOTRISONE) cream APPLY TO AFFECTED AREA TWICE A DAY 30 g 4  . fish oil-omega-3 fatty acids 1000 MG capsule Take 2 g by mouth daily.    Marland Kitchen HYDROcodone-acetaminophen (NORCO) 10-325 MG per tablet Take 1 tablet by mouth every 6 (six) hours as needed. 120 tablet 0  . levothyroxine (SYNTHROID, LEVOTHROID) 125 MCG tablet Take 1 tablet (125 mcg total) by mouth daily before breakfast. Need office visit and labs for additional refills. 90 tablet 3  . lidocaine (LIDODERM) 5 % Place 1 patch onto the skin daily as needed. Remove & Discard patch within 12 hours or as directed by MD 30 patch 5  . tamsulosin (FLOMAX) 0.4 MG CAPS capsule Take 1 capsule (0.4 mg total) by mouth daily. 90 capsule 3  . zolpidem (AMBIEN) 10 MG tablet Take 1 tablet (10 mg total) by mouth at bedtime as needed for sleep. 30 tablet 5  . LORazepam (ATIVAN) 1 MG tablet Take 1 tablet (1 mg total) by mouth daily as needed for anxiety. 30 tablet 2  . oxyCODONE-acetaminophen (PERCOCET/ROXICET) 5-325 MG per tablet Take 1 tablet by mouth every 8 (eight) hours as needed for severe pain. 60 tablet 0   No current facility-administered medications for this visit.       Objective:    BP 126/68 mmHg  Pulse 80  Temp(Src) 97.5 F (36.4 C) (Oral)  Resp 16  Ht 5' 5.5" (1.664 m)  Wt 195 lb (88.451 kg)  BMI 31.94 kg/m2  SpO2 98% Physical Exam  Constitutional: He is oriented to person, place, and time. He appears well-developed and well-nourished. No distress.  HENT:  Head: Normocephalic and atraumatic.  Right Ear: External ear normal.  Left Ear: External ear normal.  Nose: Nose normal.  Mouth/Throat: Oropharynx is clear and moist.  Eyes: Conjunctivae and EOM are normal. Pupils are equal, round, and  reactive to light.  Neck: Normal range of motion. Neck supple. Carotid bruit is not present. No thyromegaly present.  Cardiovascular: Normal rate, regular rhythm, normal heart sounds and intact distal pulses.  Exam reveals no gallop and no friction rub.   No murmur heard. Pulmonary/Chest: Effort normal and breath sounds normal. He has no wheezes. He has no rales.  Musculoskeletal:       Left hip: He exhibits normal range of motion, normal strength, no tenderness, no bony tenderness, no swelling, no crepitus, no deformity and no laceration.       Lumbar back: He exhibits normal range of motion, no tenderness, no bony tenderness, no swelling, no pain and no spasm.  Lymphadenopathy:    He has no cervical adenopathy.  Neurological: He is alert and oriented to person, place, and time. No cranial nerve deficit.  Skin: Skin is warm and dry. No rash noted. He is not diaphoretic.  Psychiatric: He has a normal mood and affect. His behavior is normal.  Nursing note and vitals reviewed.  Results for orders placed or performed in visit on 03/11/14  Urine culture  Result Value Ref Range   Colony Count NO GROWTH    Organism ID, Bacteria NO GROWTH   Comprehensive metabolic panel  Result Value Ref Range   Sodium 137 135 - 145 mEq/L   Potassium 4.0 3.5 - 5.3 mEq/L   Chloride 102 96 - 112 mEq/L   CO2 24 19 - 32 mEq/L   Glucose, Bld 101 (H) 70 - 99 mg/dL   BUN 10 6 - 23 mg/dL   Creat 1.04 0.50 - 1.35 mg/dL   Total Bilirubin 1.1 0.2 - 1.2 mg/dL   Alkaline Phosphatase 70 39 - 117 U/L   AST 11 0 - 37 U/L   ALT 14 0 - 53 U/L   Total Protein 7.2 6.0 - 8.3 g/dL   Albumin 4.4 3.5 - 5.2 g/dL   Calcium 9.8 8.4 - 10.5 mg/dL  TSH  Result Value Ref Range   TSH 1.454 0.350 - 4.500 uIU/mL  PSA  Result Value Ref Range   PSA 2.11 <=4.00 ng/mL  POCT urinalysis dipstick  Result Value Ref Range   Color, UA yellow    Clarity, UA clear    Glucose, UA neg    Bilirubin, UA neg    Ketones, UA neg    Spec Grav,  UA 1.010    Blood, UA trace    pH, UA 7.0    Protein, UA neg    Urobilinogen, UA 0.2    Nitrite, UA neg    Leukocytes, UA Negative        Assessment & Plan:   1. Degenerative disc disease, lumbar   2. Insomnia   3. Anxiety state   4. Chronic pain syndrome   5. Postprocedural fever   6. Other fatigue       1. Malaise and fatigue: Improved by 30% in past five days; no recurrent low temperatures; no fevers.  Recent labs all normal.  Reviewed Dr. Ronnald Ramp labs; WBC normal at 7.2.  ESR normal at 6.  Observation only indicated at this time. 2.  Insomnia: moderately controlled with Ambien. Cannot increase dose of Ambien; refill provided. 3.  Anxiety: worsening due to acute illness and recent back surgery; recommended SSRI or SNRI but pt declined rx; refill of Lorazepam 65m provided; will not increase dose of Lorazepam.  If starts to warrant daily, must add SSRI or SNRI.   4.  Chronic pain syndrome: persistent; recovering well from recent back surgery with much improved lower back pain; now complaining of shoulder pain R and chronic abdominal pain.  Minimal to no relief with Hydrocodone; agreeable to trail of Oxycocodone 5/325 to use PRN. If warrants daily, will warrant referral to pain management; pt expressed understanding. 5. Post-procedure fever: resolved. 6.  DDD lumbar: s/p lumbar surgery one month ago; to start PT in upcoming two weeks.  Recovering well; pain much improved in lower back.    Meds ordered this encounter  Medications  . oxyCODONE-acetaminophen (PERCOCET/ROXICET) 5-325 MG per tablet    Sig: Take 1 tablet by mouth every 8 (eight) hours as needed for severe pain.    Dispense:  60 tablet    Refill:  0  . LORazepam (ATIVAN) 1 MG tablet    Sig: Take 1 tablet (1 mg total) by mouth daily as needed for anxiety.    Dispense:  30 tablet    Refill:  2  . zolpidem (AMBIEN) 10 MG tablet    Sig: Take 1 tablet (10 mg total) by mouth at bedtime as needed for sleep.  Dispense:   30 tablet    Refill:  5    Return in about 6 weeks (around 04/27/2014).    Reginia Forts, M.D.  [facility] [address] [place], [place]  [address] [phone] phone [phone] fax

## 2014-03-16 NOTE — Progress Notes (Signed)
Subjective:    Patient ID: Henry [name], male    DOB: 04-26-1945, 68 y.o.   MRN: [id number] Patient Active Problem List   Diagnosis Date Noted  . Degenerative disc disease, lumbar 03/16/2014  . ARTERIOVENOUS MALFORMATION 07/16/2009  . DEPRESSION 07/19/2007  . HYPERTENSION 07/19/2007  . DIVERTICULITIS, HX OF 07/19/2007   Prior to Admission medications   Medication Sig Start Date End Date Taking? Authorizing Provider  benazepril-hydrochlorthiazide (LOTENSIN HCT) 20-25 MG per tablet Take 1 tablet by mouth daily. Need office visit for additional refills. Second Notice. 10/08/13  Yes Posey Boyer, MD  cholecalciferol (VITAMIN D) 1000 UNITS tablet Take 1,000 Units by mouth daily.   Yes Historical Provider, MD  clotrimazole-betamethasone (LOTRISONE) cream APPLY TO AFFECTED AREA TWICE A DAY 03/02/14  Yes Leandrew Koyanagi, MD  fish oil-omega-3 fatty acids 1000 MG capsule Take 2 g by mouth daily.   Yes Historical Provider, MD  HYDROcodone-acetaminophen (NORCO) 10-325 MG per tablet Take 1 tablet by mouth every 6 (six) hours as needed. 02/03/14  Yes Leandrew Koyanagi, MD  levothyroxine (SYNTHROID, LEVOTHROID) 125 MCG tablet Take 1 tablet (125 mcg total) by mouth daily before breakfast. Need office visit and labs for additional refills. 10/08/13  Yes Posey Boyer, MD  lidocaine (LIDODERM) 5 % Place 1 patch onto the skin daily as needed. Remove & Discard patch within 12 hours or as directed by MD 04/24/13  Yes Posey Boyer, MD  LORazepam (ATIVAN) 1 MG tablet 1/2-1 daily as needed 10/08/13  Yes Posey Boyer, MD  tamsulosin (FLOMAX) 0.4 MG CAPS capsule Take 1 capsule (0.4 mg total) by mouth daily. 10/08/13  Yes Posey Boyer, MD  zolpidem (AMBIEN) 10 MG tablet Take 1 tablet (10 mg total) by mouth at bedtime as needed for sleep. 10/08/13  Yes Posey Boyer, MD   Allergies  Allergen Reactions  . Ciprofloxacin Itching and Rash    HPI  This is a 68 year old male presenting for f/u fatigue and  postprocedural fever. He underwent lumbar disc surgery on 02/13/14. Afterwards he had 2 days of high fevers around 102. Following this, he began having low temps around 95.0. He notes when he is sick he frequently has low temps instead of high. He saw his neurosurgeon 1 week ago who recommended extensive blood work. He was also seen here 1 week ago -  All blood work negative (CMP, TSH, PSA, UA, urine culture). He states he is starting to feel better. His temperature has slowly been getting back to normal. His temp was 96.0 - 97.1 over the weekend. Today his temp is 97.5. He reports his temperature is 97.6 at baseline. He states over the past few days he has started feeling more energetic. He is no longer having headaches and if he does they are very mild. He is not having any back pain but reports his back feels weak and unstable. His neurosurgeon mentioned PT but wants pt to wait 6 weeks post-surgery. He reports some new pain in his left hip and lateral thigh. Pain worsens throughout the day.  He is using bengay cream which helps. Pt is wanting refill of norco - his neurosurgeon has prescribed it in the past. His next appt with NS is 04/04/14.  Anxiety: he is needing refill of lorazepam. He is wondering if there is anything stronger he can take. He currently takes 1 mg in the mornings and 0.5 mg in the evenings.  Insomnia: he is needing  refill of ambien. He takes 10 mg at night and helps him sleep.  Review of Systems  Constitutional: Positive for fever and fatigue. Negative for chills.  Respiratory: Negative for shortness of breath.   Cardiovascular: Negative for chest pain.  Gastrointestinal: Negative for nausea, vomiting and abdominal pain.  Genitourinary: Negative for difficulty urinating.  Musculoskeletal: Positive for back pain.  Skin: Negative for rash.  Neurological: Positive for headaches. Negative for numbness.      Objective:   Physical Exam  Constitutional: He is oriented to person,  place, and time. He appears well-developed and well-nourished. No distress.  HENT:  Head: Normocephalic and atraumatic.  Right Ear: Hearing normal.  Left Ear: Hearing normal.  Nose: Nose normal.  Eyes: Conjunctivae and lids are normal. Right eye exhibits no discharge. Left eye exhibits no discharge. No scleral icterus.  Cardiovascular: Normal rate, regular rhythm, normal heart sounds, intact distal pulses and normal pulses.   Pulmonary/Chest: Effort normal and breath sounds normal. No respiratory distress. He has no wheezes. He has no rhonchi. He has no rales.  Musculoskeletal: Normal range of motion.  Neurological: He is alert and oriented to person, place, and time.  Skin: Skin is warm, dry and intact.  Lumbar surgical scar healing well, no signs of infection  Psychiatric: He has a normal mood and affect. His speech is normal and behavior is normal. Thought content normal.      Assessment & Plan:  1. Degenerative disc disease, lumbar 2. Post-procedural fever Surgical scar healing nicely. He is having decreased pain in his back. He is starting to feel more energized s/p lumbar disc fusion 02/13/14. He was having high and low temperatures - his temperatures have equalized at this time. Temperature today 97.5. All labs negative (CBC, CMP, CRP, TSH, PSA, UA, urine culture).  2. Insomnia ambien refilled. - zolpidem (AMBIEN) 10 MG tablet; Take 1 tablet (10 mg total) by mouth at bedtime as needed for sleep.  Dispense: 30 tablet; Refill: 5  3. Anxiety state Lorazepam refilled.  4. Chronic pain syndrome D/c norco. Given prescription for percocet.   Benjaman Pott Drenda Freeze, MHS [facility]  03/16/2014

## 2014-03-17 ENCOUNTER — Encounter: Payer: Self-pay | Admitting: Family Medicine

## 2014-03-17 MED ORDER — BENAZEPRIL-HYDROCHLOROTHIAZIDE 20-25 MG PO TABS: 1.0000 | ORAL_TABLET | Freq: Every day | ORAL | Status: DC

## 2014-03-17 MED ORDER — LIDOCAINE 5 % EX PTCH: 1.0000 | MEDICATED_PATCH | Freq: Every day | CUTANEOUS | Status: DC | PRN

## 2014-03-17 MED ORDER — LEVOTHYROXINE SODIUM 125 MCG PO TABS: 125.0000 ug | ORAL_TABLET | Freq: Every day | ORAL | Status: DC

## 2014-03-17 MED ORDER — TAMSULOSIN HCL 0.4 MG PO CAPS: 0.4000 mg | ORAL_CAPSULE | Freq: Every day | ORAL | Status: DC

## 2014-03-19 ENCOUNTER — Telehealth: Payer: Self-pay

## 2014-03-19 NOTE — Telephone Encounter (Signed)
Per patient he has a mystery illness that Dr Tamala Julian has been treating him for. States he has been running a low temp of 95 and his back has started hurting him along with his hip and leg. Patient requested to speak with Dr Tamala Julian directly I informed him I would have to take a message. I asked what he needed and he stated an appointment. I did scheduled him for Monday Dec 7th at 1 pm. Patients call back number is [phone]

## 2014-03-20 NOTE — Telephone Encounter (Signed)
Dr. Tamala Julian,  Spoke to pt. He says his temperature has been 95.4-96.1 and that he is "becoming weaker." His hips and thighs have been giving him a lot of pain. He plans to come to the appt on 12/7; I advised him if anything becomes worse that he can see you this weekend at the walk in clinic.   [facility]

## 2014-03-20 NOTE — Telephone Encounter (Signed)
Noted and agree. 

## 2014-03-23 ENCOUNTER — Ambulatory Visit (INDEPENDENT_AMBULATORY_CARE_PROVIDER_SITE_OTHER): Payer: Medicare Other | Admitting: Family Medicine

## 2014-03-23 ENCOUNTER — Encounter: Payer: Self-pay | Admitting: Family Medicine

## 2014-03-23 VITALS — BP 130/76 | HR 83 | Temp 97.5°F | Resp 16 | Ht 65.5 in | Wt 194.6 lb

## 2014-03-23 DIAGNOSIS — M545 Low back pain, unspecified: Secondary | ICD-10-CM

## 2014-03-23 DIAGNOSIS — M2550 Pain in unspecified joint: Secondary | ICD-10-CM

## 2014-03-23 DIAGNOSIS — R5381 Other malaise: Secondary | ICD-10-CM

## 2014-03-23 DIAGNOSIS — R5383 Other fatigue: Secondary | ICD-10-CM

## 2014-03-23 DIAGNOSIS — T68XXXD Hypothermia, subsequent encounter: Secondary | ICD-10-CM

## 2014-03-23 NOTE — Progress Notes (Addendum)
Subjective:    Patient ID: Henry [name], male    DOB: May 19, 1945, 68 y.o.   MRN: [id number]  03/23/2014  Hip Pain; Back Pain; Fatigue; and Fever   HPI This 68 y.o. male presents for one week follow-up for low temperature readings, arthralgias.  After visit last week, started feeling poorly.  Running low temperatures again in 94-95 range.  Having a lot more fatigue again and weakness.  Had a really bad day yesterday but every day has been bad.  Temperatures ranging 95.1-97.5.  Early upon awakening, temperature around 95.0.  During the day, running 96.0.  Will run in 97.5 and will start feeling better.  Having a lot more pain in lower back and L hip.  R lower back is fine.  Follow-up 04/14/14 with Dr. Ronnald Ramp; has not contacted Dr. Ronnald Ramp regarding worsening back pain in past week.  Back pain runs from neck to lower back.  L hip pain fluctuates.  Taking Percocet at 1:00pm; helps for six hours and then evening comes.  Does better when pushes self.  Makes self go to feel better.  If stands up at work bench, within 30-45 minutes, lower back pain worsens.  Spine gets weak.  Not walking right; walking with forward posture.    Surgery 02/13/14.    No n/v/d.  Eating well.  Appetite is good.  No cough, SOB.  No chest pain.  No abdominal pain. No dysuria, urgency, frequency.  Continues to have intermittent scrotal and perineum pain.  No joint swelling.  No URI symptoms; no sore throat, ear pain, rhinorrhea.     Review of Systems  Constitutional: Positive for activity change and fatigue. Negative for fever, chills, diaphoresis, appetite change and unexpected weight change.  HENT: Negative for congestion, dental problem, drooling, ear discharge, ear pain, postnasal drip, rhinorrhea, sinus pressure, sore throat and trouble swallowing.   Respiratory: Negative for cough, shortness of breath, wheezing and stridor.   Cardiovascular: Negative for chest pain, palpitations and leg swelling.  Gastrointestinal:  Negative for nausea, vomiting, abdominal pain, diarrhea and abdominal distention.  Endocrine: Negative for cold intolerance, heat intolerance, polydipsia, polyphagia and polyuria.  Genitourinary: Positive for testicular pain. Negative for dysuria, urgency, frequency, hematuria, decreased urine volume, discharge, penile swelling, scrotal swelling, genital sores and penile pain.  Musculoskeletal: Positive for back pain, arthralgias, gait problem, neck pain and neck stiffness. Negative for joint swelling.  Skin: Negative for color change, pallor, rash and wound.  Neurological: Positive for numbness. Negative for dizziness, tremors, syncope, facial asymmetry, speech difficulty, weakness, light-headedness and headaches.  Psychiatric/Behavioral: Positive for sleep disturbance. The patient is nervous/anxious.     Past Medical History  Diagnosis Date  . Arthritis   . Hypertension   . Hypothyroidism   . Anxiety    Past Surgical History  Procedure Laterality Date  . 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 Cholecystectomy    . Colon surgery    . Spine surgery    . Fracture surgery      left shoulder   Allergies  Allergen Reactions  . Ciprofloxacin Itching and Rash        Objective:    BP 130/76 mmHg  Pulse 83  Temp(Src) 97.5 F (36.4 C) (Oral)  Resp 16  Ht 5' 5.5" (1.664 m)  Wt 194 lb 9.6 oz (88.27 kg)  BMI 31.88 kg/m2  SpO2 97% Physical Exam  Constitutional: He is oriented to person, place, and time. He appears well-developed and well-nourished. No distress.  HENT:  Head: Normocephalic and atraumatic.  Right Ear: External ear normal.  Left Ear: External ear normal.  Nose: Nose normal.  Mouth/Throat: Oropharynx is clear and moist.  Eyes: Conjunctivae and EOM are normal. Pupils are equal, round, and reactive to light.  Neck: Normal range of motion. Neck supple. Carotid bruit is not present. No thyromegaly present.  Cardiovascular: Normal rate, regular rhythm, normal heart  sounds and intact distal pulses.  Exam reveals no gallop and no friction rub.   No murmur heard. Pulmonary/Chest: Effort normal and breath sounds normal. He has no wheezes. He has no rales.  Abdominal: Soft. Bowel sounds are normal. He exhibits no distension and no mass. There is no tenderness. There is no rebound and no guarding.  Lymphadenopathy:    He has no cervical adenopathy.  Neurological: He is alert and oriented to person, place, and time. No cranial nerve deficit. He exhibits normal muscle tone. Coordination normal.  Skin: Skin is warm and dry. No rash noted. He is not diaphoretic.  Psychiatric: He has a normal mood and affect. His behavior is normal.  Nursing note and vitals reviewed.  Results for orders placed or performed in visit on 03/11/14  Urine culture  Result Value Ref Range   Colony Count NO GROWTH    Organism ID, Bacteria NO GROWTH   Comprehensive metabolic panel  Result Value Ref Range   Sodium 137 135 - 145 mEq/L   Potassium 4.0 3.5 - 5.3 mEq/L   Chloride 102 96 - 112 mEq/L   CO2 24 19 - 32 mEq/L   Glucose, Bld 101 (H) 70 - 99 mg/dL   BUN 10 6 - 23 mg/dL   Creat 1.04 0.50 - 1.35 mg/dL   Total Bilirubin 1.1 0.2 - 1.2 mg/dL   Alkaline Phosphatase 70 39 - 117 U/L   AST 11 0 - 37 U/L   ALT 14 0 - 53 U/L   Total Protein 7.2 6.0 - 8.3 g/dL   Albumin 4.4 3.5 - 5.2 g/dL   Calcium 9.8 8.4 - 10.5 mg/dL  TSH  Result Value Ref Range   TSH 1.454 0.350 - 4.500 uIU/mL  PSA  Result Value Ref Range   PSA 2.11 <=4.00 ng/mL  POCT urinalysis dipstick  Result Value Ref Range   Color, UA yellow    Clarity, UA clear    Glucose, UA neg    Bilirubin, UA neg    Ketones, UA neg    Spec Grav, UA 1.010    Blood, UA trace    pH, UA 7.0    Protein, UA neg    Urobilinogen, UA 0.2    Nitrite, UA neg    Leukocytes, UA Negative        Assessment & Plan:   1. Hypothermia, subsequent encounter   2. Left-sided low back pain without sciatica   3. Malaise and fatigue   4.  Arthralgia    -Persistent. -Obtain further labs including blood cultures, repeat CBC, CK, CMET, Rheumatoid Factor, ANA, Sedimentation rate, Testosterone level. -Recommend follow-up this week with NS due to worsening lower back pain.   -Close follow-up in one week.   -Exam non-focal.  Normal TSH two weeks ago; thus, low yield in repeating level today.   No orders of the defined types were placed in this encounter.    Return in about 1 week (around 03/30/2014) for recheck.    Reginia Forts, M.D.  [facility] [place], [place]  [address] [phone] phone [phone] fax

## 2014-03-23 NOTE — Progress Notes (Signed)
MRN: [id number] DOB: 06/11/1945  Subjective:   Henry [name] is a 68 y.o. male presenting for follow up on a.m. hypothermia and back pain s/p spine surgery 01/2014.  Patient was seen 03/16/2014 by Dr. Tamala Julian. During that visit patient insisted on a trial of opioid pain medication for his back pain, was agreeable to trying PT in mid December and was not interested in SSRI, SNRI treatment. Today, he reports that his back pain has worsened since his last visit, primarily located in his back but also shoulder, left hip and bilaterally in his thighs. He states that he has had increasing difficulty ambulating, feels like he is hunched over. Patient has been using Percocet once a day at noon with moderate relief of his pain for ~4 hours. Has not used it more than once in a day. Patient does admit that this is not a viable option for management of his pain and states that he thinks he has an infection. He is now not interested in PT, still not interested in SSRI, SNRI, states that he think he needs to have a spinal tap to find the source of his infection. Patient also admits that if Dr. Tamala Julian does not order the test, he will have his Neurologist do this or find a doctor that will.   Patient also complains of fatigue, weakness in the a.m., checks his temperature multiple times a day and ranges 94-8F, in the afternoon ranges, 96-58F. States that he thinks this may be related to his back problem but would like to have his TSH rechecked despite a very normal level 2 weeks ago and also would like to have a testosterone level.  Denies any other aggravating or relieving factors, no other questions or concerns.  [name] has a current medication list which includes the following prescription(s): benazepril-hydrochlorthiazide, cholecalciferol, clotrimazole-betamethasone, fish oil-omega-3 fatty acids, hydrocodone-acetaminophen, levothyroxine, lidocaine, lorazepam, oxycodone-acetaminophen, tamsulosin,  and zolpidem.  [name] is allergic to ciprofloxacin.  [name]  has a past medical history of Arthritis; Hypertension; Hypothyroidism; and Anxiety. 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  ROS As in subjective.  Objective:   Vitals: BP 130/76 mmHg  Pulse 83  Temp(Src) 97.5 F (36.4 C) (Oral)  Resp 16  Ht 5' 5.5" (1.664 m)  Wt 194 lb 9.6 oz (88.27 kg)  BMI 31.88 kg/m2  SpO2 97%  Physical Exam  Constitutional: He is oriented to person, place, and time and well-developed, well-nourished, and in no distress.  Cardiovascular: Normal rate.   Pulmonary/Chest: Effort normal.  Musculoskeletal:  Diffuse tenderness over spinous processes and paraspinal muscles, patient ambulates with a slight limp and abnormal shoulder movement, negative SLR, good hip ROM, strength 5/5 bilaterally  Neurological: He is alert and oriented to person, place, and time.  Skin: Skin is warm and dry. He is not diaphoretic.  Psychiatric: His mood appears anxious. His affect is labile (easily agitated when discussing potential options for treatment plan). His affect is not blunt. He is agitated. He exhibits a depressed mood. He expresses no homicidal and no suicidal ideation. He has a flat affect.   Assessment and Plan :   1. Hypothermia, subsequent encounter 2. Left-sided low back pain without sciatica - CBC with Differential - Sedimentation rate - C-reactive protein - ANA - Rheumatoid factor - Culture, blood (single) - hypothermia of unclear etiology, labs pending, refer to Spine  Surgeon for further evaluation, imaging including patient request for spinal tap   Jaynee Eagles, PA-C [facility] [phone] 03/23/2014 1:47 PM

## 2014-03-26 ENCOUNTER — Other Ambulatory Visit (INDEPENDENT_AMBULATORY_CARE_PROVIDER_SITE_OTHER): Payer: Medicare Other | Admitting: *Deleted

## 2014-03-26 DIAGNOSIS — M545 Low back pain, unspecified: Secondary | ICD-10-CM

## 2014-03-26 DIAGNOSIS — T68XXXD Hypothermia, subsequent encounter: Secondary | ICD-10-CM

## 2014-03-26 LAB — COMPREHENSIVE METABOLIC PANEL
ALT: 14 U/L (ref 0–53)
AST: 12 U/L (ref 0–37)
Albumin: 4.9 g/dL (ref 3.5–5.2)
Alkaline Phosphatase: 80 U/L (ref 39–117)
BUN: 18 mg/dL (ref 6–23)
CO2: 27 mEq/L (ref 19–32)
Calcium: 10.5 mg/dL (ref 8.4–10.5)
Chloride: 101 mEq/L (ref 96–112)
Creat: 1.12 mg/dL (ref 0.50–1.35)
Glucose, Bld: 93 mg/dL (ref 70–99)
Potassium: 4.2 mEq/L (ref 3.5–5.3)
Sodium: 137 mEq/L (ref 135–145)
Total Bilirubin: 1.7 mg/dL — ABNORMAL HIGH (ref 0.2–1.2)
Total Protein: 8.1 g/dL (ref 6.0–8.3)

## 2014-03-26 LAB — CBC WITH DIFFERENTIAL/PLATELET
Basophils Absolute: 0 10*3/uL (ref 0.0–0.1)
Basophils Relative: 0 % (ref 0–1)
Eosinophils Absolute: 0.1 10*3/uL (ref 0.0–0.7)
Eosinophils Relative: 2 % (ref 0–5)
HCT: 41.8 % (ref 39.0–52.0)
Hemoglobin: 14.2 g/dL (ref 13.0–17.0)
Lymphocytes Relative: 30 % (ref 12–46)
Lymphs Abs: 2.2 10*3/uL (ref 0.7–4.0)
MCH: 25.3 pg — ABNORMAL LOW (ref 26.0–34.0)
MCHC: 34 g/dL (ref 30.0–36.0)
MCV: 74.4 fL — ABNORMAL LOW (ref 78.0–100.0)
MPV: 8.9 fL — ABNORMAL LOW (ref 9.4–12.4)
Monocytes Absolute: 0.7 10*3/uL (ref 0.1–1.0)
Monocytes Relative: 10 % (ref 3–12)
Neutro Abs: 4.2 10*3/uL (ref 1.7–7.7)
Neutrophils Relative %: 58 % (ref 43–77)
Platelets: 415 10*3/uL — ABNORMAL HIGH (ref 150–400)
RBC: 5.62 MIL/uL (ref 4.22–5.81)
RDW: 15.3 % (ref 11.5–15.5)
WBC: 7.3 10*3/uL (ref 4.0–10.5)

## 2014-03-26 LAB — TESTOSTERONE: Testosterone: 391 ng/dL (ref 300–890)

## 2014-03-26 LAB — CK: Total CK: 57 U/L (ref 7–232)

## 2014-03-26 LAB — RHEUMATOID FACTOR: Rhuematoid fact SerPl-aCnc: 35 IU/mL — ABNORMAL HIGH (ref <=14)

## 2014-03-27 ENCOUNTER — Telehealth: Payer: Self-pay

## 2014-03-27 LAB — SEDIMENTATION RATE: Sed Rate: 10 mm/hr (ref 0–16)

## 2014-03-27 LAB — ANA: Anti Nuclear Antibody(ANA): NEGATIVE

## 2014-03-27 NOTE — Telephone Encounter (Signed)
Spoke to patient regarding flu shot.  Patient declines.

## 2014-04-01 LAB — CULTURE, BLOOD (SINGLE): Organism ID, Bacteria: NO GROWTH

## 2014-04-02 ENCOUNTER — Other Ambulatory Visit: Payer: Self-pay | Admitting: Family Medicine

## 2014-04-02 DIAGNOSIS — M2550 Pain in unspecified joint: Secondary | ICD-10-CM

## 2014-04-02 DIAGNOSIS — R768 Other specified abnormal immunological findings in serum: Secondary | ICD-10-CM

## 2014-04-02 MED ORDER — SULFAMETHOXAZOLE-TRIMETHOPRIM 800-160 MG PO TABS: 1.0000 | ORAL_TABLET | Freq: Two times a day (BID) | ORAL | Status: DC

## 2014-04-29 ENCOUNTER — Telehealth: Payer: Self-pay

## 2014-04-29 ENCOUNTER — Ambulatory Visit: Payer: Medicare Other | Admitting: Family Medicine

## 2014-04-29 DIAGNOSIS — K5730 Diverticulosis of large intestine without perforation or abscess without bleeding: Secondary | ICD-10-CM | POA: Diagnosis not present

## 2014-04-29 DIAGNOSIS — R109 Unspecified abdominal pain: Secondary | ICD-10-CM | POA: Diagnosis not present

## 2014-04-29 NOTE — Telephone Encounter (Signed)
PA denied for lidocaine patches bc not an FDA approved Dxs that pt is using them for. Do you want to Rx anything else for pt?

## 2014-04-30 NOTE — Telephone Encounter (Signed)
Who prescribed these?  I have not seen him in a year.

## 2014-05-01 ENCOUNTER — Telehealth: Payer: Self-pay

## 2014-05-01 NOTE — Telephone Encounter (Signed)
Pt called stating Henry [name] called him and he missed his call. Please advise at [phone]

## 2014-05-01 NOTE — Telephone Encounter (Signed)
LM with wife to have pt call us back.  Pt was seen last by Rosario Adie- patches are marked as taking in OV but nothing prescribed since Jan 2015. Need to clarify who has been providing refills to pt.

## 2014-05-03 NOTE — Telephone Encounter (Signed)
See previous phone message. 

## 2014-05-05 NOTE — Telephone Encounter (Signed)
Lm for pt to rtn call   Ronalee Belts any suggestions on a alternative.

## 2014-05-06 NOTE — Telephone Encounter (Signed)
I saw this patient with Dr. Tamala Julian during her clinic. She knows this patient much better than I do. I tried to help her with him that day and I think she prescribed the lidocaine patches. I do know that he is a very complicated case and suggest that Dr. Tamala Julian be involved.   Jaynee Eagles, PA-C [facility] [phone] 05/06/2014  9:34 AM

## 2014-05-07 DIAGNOSIS — K5730 Diverticulosis of large intestine without perforation or abscess without bleeding: Secondary | ICD-10-CM | POA: Diagnosis not present

## 2014-05-07 NOTE — Telephone Encounter (Signed)
What diagnosis did you submit for lidocaine patches?  Where does patient use lidocaine patches?

## 2014-05-27 DIAGNOSIS — E611 Iron deficiency: Secondary | ICD-10-CM | POA: Diagnosis not present

## 2014-05-27 DIAGNOSIS — N281 Cyst of kidney, acquired: Secondary | ICD-10-CM | POA: Diagnosis not present

## 2014-06-09 DIAGNOSIS — G479 Sleep disorder, unspecified: Secondary | ICD-10-CM | POA: Diagnosis not present

## 2014-06-09 DIAGNOSIS — Z1212 Encounter for screening for malignant neoplasm of rectum: Secondary | ICD-10-CM | POA: Diagnosis not present

## 2014-06-09 DIAGNOSIS — I10 Essential (primary) hypertension: Secondary | ICD-10-CM | POA: Diagnosis not present

## 2014-06-09 DIAGNOSIS — E611 Iron deficiency: Secondary | ICD-10-CM | POA: Diagnosis not present

## 2014-06-09 DIAGNOSIS — K621 Rectal polyp: Secondary | ICD-10-CM | POA: Diagnosis not present

## 2014-06-09 DIAGNOSIS — K6389 Other specified diseases of intestine: Secondary | ICD-10-CM | POA: Diagnosis not present

## 2014-06-09 DIAGNOSIS — Z881 Allergy status to other antibiotic agents status: Secondary | ICD-10-CM | POA: Diagnosis not present

## 2014-06-09 DIAGNOSIS — K5730 Diverticulosis of large intestine without perforation or abscess without bleeding: Secondary | ICD-10-CM | POA: Diagnosis not present

## 2014-06-09 DIAGNOSIS — E079 Disorder of thyroid, unspecified: Secondary | ICD-10-CM | POA: Diagnosis not present

## 2014-06-09 DIAGNOSIS — D509 Iron deficiency anemia, unspecified: Secondary | ICD-10-CM | POA: Diagnosis not present

## 2014-06-17 DIAGNOSIS — E611 Iron deficiency: Secondary | ICD-10-CM | POA: Diagnosis not present

## 2014-06-26 NOTE — Telephone Encounter (Signed)
Dr Tamala Julian, I'm not sure why I never saw this until I was closing out some open encounters, perhaps someone "doned" it out of Rx Pool before I saw it? I'm sorry.... I used all of the related Dxs I could on the PA for lidocaine patches: DDD, LBP, hip pain, OA of mult joints (all I saw were hip joints and into thigh). Some ins companies only ever authorize these patches for post hepatic neuralgia. Some will cover it for pain in smaller joints: wrists, hands/fingers, knees, ankles/feet. I didn't see any of these on his problem list or Dxs for any OVs? If he has OA/pain in any of these small joints I will be glad to send it back in with that Dx, just didn't see any documentation. Sometimes ins will cover Voltaren gel when they don't cover the lidocaine patches.

## 2014-06-27 LAB — HM COLONOSCOPY

## 2014-06-27 NOTE — Telephone Encounter (Signed)
Noted. Patient suffers with chronic lower back pain and shoulder pain.  We can address in future.  No further action warranted.

## 2014-07-03 DIAGNOSIS — N401 Enlarged prostate with lower urinary tract symptoms: Secondary | ICD-10-CM | POA: Diagnosis not present

## 2014-07-03 DIAGNOSIS — N3289 Other specified disorders of bladder: Secondary | ICD-10-CM | POA: Diagnosis not present

## 2014-07-03 DIAGNOSIS — N281 Cyst of kidney, acquired: Secondary | ICD-10-CM | POA: Diagnosis not present

## 2014-07-03 DIAGNOSIS — N138 Other obstructive and reflux uropathy: Secondary | ICD-10-CM | POA: Diagnosis not present

## 2014-07-09 DIAGNOSIS — H43393 Other vitreous opacities, bilateral: Secondary | ICD-10-CM | POA: Diagnosis not present

## 2014-07-20 ENCOUNTER — Encounter: Payer: Self-pay | Admitting: *Deleted

## 2014-08-11 ENCOUNTER — Encounter: Payer: Self-pay | Admitting: *Deleted

## 2014-08-14 ENCOUNTER — Telehealth: Payer: Self-pay

## 2014-08-14 NOTE — Telephone Encounter (Signed)
PA for Lidocaine 5% patch

## 2014-08-18 NOTE — Telephone Encounter (Signed)
PA denied. Is there anything else we can prescribe.

## 2014-08-18 NOTE — Telephone Encounter (Signed)
Not at this time.  I see pt tomorrow.

## 2014-08-19 ENCOUNTER — Encounter: Payer: Self-pay | Admitting: Family Medicine

## 2014-08-19 ENCOUNTER — Ambulatory Visit (INDEPENDENT_AMBULATORY_CARE_PROVIDER_SITE_OTHER): Payer: Medicare Other | Admitting: Family Medicine

## 2014-08-19 VITALS — BP 118/76 | HR 82 | Temp 98.0°F | Resp 16 | Ht 66.0 in | Wt 192.6 lb

## 2014-08-19 DIAGNOSIS — E038 Other specified hypothyroidism: Secondary | ICD-10-CM | POA: Diagnosis not present

## 2014-08-19 DIAGNOSIS — E034 Atrophy of thyroid (acquired): Secondary | ICD-10-CM | POA: Diagnosis not present

## 2014-08-19 DIAGNOSIS — E611 Iron deficiency: Secondary | ICD-10-CM | POA: Diagnosis not present

## 2014-08-19 DIAGNOSIS — F32A Depression, unspecified: Secondary | ICD-10-CM

## 2014-08-19 DIAGNOSIS — M5136 Other intervertebral disc degeneration, lumbar region: Secondary | ICD-10-CM | POA: Diagnosis not present

## 2014-08-19 DIAGNOSIS — F418 Other specified anxiety disorders: Secondary | ICD-10-CM | POA: Diagnosis not present

## 2014-08-19 DIAGNOSIS — G894 Chronic pain syndrome: Secondary | ICD-10-CM

## 2014-08-19 DIAGNOSIS — F329 Major depressive disorder, single episode, unspecified: Secondary | ICD-10-CM

## 2014-08-19 DIAGNOSIS — G4700 Insomnia, unspecified: Secondary | ICD-10-CM | POA: Diagnosis not present

## 2014-08-19 DIAGNOSIS — I10 Essential (primary) hypertension: Secondary | ICD-10-CM

## 2014-08-19 DIAGNOSIS — F419 Anxiety disorder, unspecified: Secondary | ICD-10-CM

## 2014-08-19 DIAGNOSIS — T68XXXD Hypothermia, subsequent encounter: Secondary | ICD-10-CM

## 2014-08-19 MED ORDER — CITALOPRAM HYDROBROMIDE 20 MG PO TABS: 20.0000 mg | ORAL_TABLET | Freq: Every day | ORAL | Status: DC

## 2014-08-19 MED ORDER — HYDROCODONE-ACETAMINOPHEN 5-325 MG PO TABS: 1.0000 | ORAL_TABLET | Freq: Two times a day (BID) | ORAL | Status: DC

## 2014-08-19 NOTE — Progress Notes (Signed)
Subjective:    Patient ID: Henry [name], male    DOB: July 30, 1945, 69 y.o.   MRN: [id number]  08/19/2014  Medication Refill; Hypothyroidism; Hypertension; Back Pain; and Osteoarthritis   HPI This 69 y.o. male presents for five month follow-up of the following:  1.  Hypothermia:  S/p evaluation by four physicians; diagnosed with hypothermia; went to [facility]; evaluated by specialist; saw Gilliam/GI and Tannebaum/Urology.  S/p CT scans of entire body.  Iron deficiency anemia identified; will not take iron tablets because it causes nausea; received iron infusions.  Going back today to [facility] to meet with them again.  Doing more labs to see if iron infusions have improved.  S/p EGD and colonoscopy; no source of bleeding identified; did not perform capsular endoscopy.  They may perform capsular endoscopy for small intestines bleeding.  Dr. Ronnald Ramp didn't agree with bleeding from spine.  Spine was leaking out of spinal cord; identified that through the CT scan.  Feel that bleeding was from spinal surgery. Feels that surgery triggered iron deficiency and bleeding; also feels that lead to hypothermia.  Leaking fluid from spine can lead to temperature differentials.  For three months, ran low temperatures.  Then, started healing from surgery and recovered.  Six months later, pt is better but having some problems.   Tannebaum is working on prostate issues.  Prostate issues did not lead to hypothermia.  Feel that surgery triggered hypothermia. Also feels that surgery triggered issues.  Dr. Ronnald Ramp did not identify issues that could have been triggered by surgery. No addition tests performed by Dr. Ronnald Ramp.   Pt not willing to undergo TURP.  S/p cystoscopy in office.  Has mass on kidney; that is not a problem.  Trying differing prostate medications.  Cyst on kidney.  Prostate is pressing on bladder.    2.  HTN:  Patient reports good compliance with medication, good tolerance to medication, and good symptom  control.  Denies CP/palp/SOB/leg swelling.  3.  Hypothyroidism:  Last TSH obtained 02/2014 1.454.  Patient reports good compliance with medication, good tolerance to medication, and good symptom control.    4.  Insomnia:  Needs refill of Ambien 10mg  qhs.   5.  Anxiety:  Having a lot of anxiety and very fidgety.  About to make a Personal assistant.  Plus had two sons in prison for long time periods.  Wearing pt down. Now in business issues.  Feeling really down.  Feels like going to shut down.  Everyone declines habit forming medications.  Worried about getting addictive.  Major life change.  Affecting erection issues.  Requesting hormone check.  Feels that is coming to play due to stress.  Ten years ago, was playing ice hockey; now sick and unable to play sports.  Excessive worry and stress.  Taking Lorazepam 1/2 around noon without improvement; taking one whole tablet without improvement.  Needs "something like a Vicodin that will settle me down".  Has tried Prozac 20 years ago and felt horrible.    6.  Chronic pain syndrome:  Last prescribed oxycodone on 03/16/2014  #60 by K. Jguadalupe Opiela.  Hydrocodone prescribed 02/03/14 #120.  Took oxycodone and caused nausea.  Hydrocodone quit taking those because ineffective.  White pill was not strong enough but worked better than higher dose; lower dose worked better.  Shoulder has been broken; s/p abdominal hernia surgeries x 4; s/p lumbar spine; chronic pain daily.  Has tried oxycontin that is not effective.   Can take hydrocodone 1  bid with some pain relief; not the yellow pill.  Having pain in L hip that radiates into L leg.  Dr. Ronnald Ramp states that surgery did not cause pain.    Review of Systems  Constitutional: Negative for fever, chills, diaphoresis, activity change, appetite change and fatigue.  Respiratory: Negative for cough and shortness of breath.   Cardiovascular: Negative for chest pain, palpitations and leg swelling.  Gastrointestinal: Positive  for abdominal pain. Negative for nausea, vomiting, diarrhea, constipation, blood in stool and rectal pain.  Endocrine: Negative for cold intolerance, heat intolerance, polydipsia, polyphagia and polyuria.  Musculoskeletal: Positive for myalgias, back pain, arthralgias and gait problem. Negative for joint swelling.  Skin: Negative for color change, rash and wound.  Neurological: Negative for dizziness, tremors, seizures, syncope, facial asymmetry, speech difficulty, weakness, light-headedness, numbness and headaches.  Psychiatric/Behavioral: Positive for sleep disturbance and dysphoric mood. Negative for suicidal ideas and self-injury. The patient is nervous/anxious.     Past Medical History  Diagnosis Date  . Arthritis   . Hypertension   . Hypothyroidism   . Anxiety   . Degenerative disc disease, lumbar    Past Surgical History  Procedure Laterality Date  . 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 Cholecystectomy    . Colon surgery    . Spine surgery    . Fracture surgery      left shoulder   Allergies  Allergen Reactions  . Ciprofloxacin Itching and Rash   Current Outpatient Prescriptions  Medication Sig Dispense Refill  . benazepril-hydrochlorthiazide (LOTENSIN HCT) 20-25 MG per tablet Take 1 tablet by mouth daily. Need office visit for additional refills. Second Notice. 90 tablet 3  . cholecalciferol (VITAMIN D) 1000 UNITS tablet Take 1,000 Units by mouth daily.    . fish oil-omega-3 fatty acids 1000 MG capsule Take 2 g by mouth daily.    Marland Kitchen levothyroxine (SYNTHROID, LEVOTHROID) 125 MCG tablet Take 1 tablet (125 mcg total) by mouth daily before breakfast. Need office visit and labs for additional refills. 90 tablet 3  . lidocaine (LIDODERM) 5 % Place 1 patch onto the skin daily as needed. Remove & Discard patch within 12 hours or as directed by MD 30 patch 5  . LORazepam (ATIVAN) 1 MG tablet Take 1 tablet (1 mg total) by mouth daily as needed for anxiety. 30 tablet 2  .  tamsulosin (FLOMAX) 0.4 MG CAPS capsule Take 1 capsule (0.4 mg total) by mouth daily. 90 capsule 3  . zolpidem (AMBIEN) 10 MG tablet Take 1 tablet (10 mg total) by mouth at bedtime as needed for sleep. 30 tablet 5  . citalopram (CELEXA) 20 MG tablet Take 1 tablet (20 mg total) by mouth daily. 30 tablet 3  . clotrimazole-betamethasone (LOTRISONE) cream APPLY TO AFFECTED AREA TWICE A DAY (Patient not taking: Reported on 08/19/2014) 30 g 4  . HYDROcodone-acetaminophen (NORCO/VICODIN) 5-325 MG per tablet Take 1 tablet by mouth 2 (two) times daily. 60 tablet 0  . oxyCODONE-acetaminophen (PERCOCET/ROXICET) 5-325 MG per tablet Take 1 tablet by mouth every 8 (eight) hours as needed for severe pain. (Patient not taking: Reported on 08/19/2014) 60 tablet 0   No current facility-administered medications for this visit.   History   Social History  . Marital Status: Married    Spouse Name: N/A  . Number of Children: N/A  . Years of Education: N/A   Occupational History  . Not on file.   Social History Main Topics  . Smoking status: Never Smoker   .  Smokeless tobacco: Not on file  . Alcohol Use: Not on file  . Drug Use: Not on file  . Sexual Activity: Not on file   Other Topics Concern  . Not on file   Social History Narrative   History reviewed. No pertinent family history.      Objective:    BP 118/76 mmHg  Pulse 82  Temp(Src) 98 F (36.7 C) (Oral)  Resp 16  Ht 5\' 6"  (1.676 m)  Wt 192 lb 9.6 oz (87.363 kg)  BMI 31.10 kg/m2  SpO2 97% Physical Exam  Constitutional: He is oriented to person, place, and time. He appears well-developed and well-nourished. No distress.  HENT:  Head: Normocephalic and atraumatic.  Right Ear: External ear normal.  Left Ear: External ear normal.  Nose: Nose normal.  Mouth/Throat: Oropharynx is clear and moist.  Eyes: Conjunctivae and EOM are normal. Pupils are equal, round, and reactive to light.  Neck: Normal range of motion. Neck supple. Carotid  bruit is not present. No thyromegaly present.  Cardiovascular: Normal rate, regular rhythm, normal heart sounds and intact distal pulses.  Exam reveals no gallop and no friction rub.   No murmur heard. Pulmonary/Chest: Effort normal and breath sounds normal. He has no wheezes. He has no rales.  Abdominal: Soft. Bowel sounds are normal. He exhibits no distension and no mass. There is no tenderness. There is no rebound and no guarding.  Lymphadenopathy:    He has no cervical adenopathy.  Neurological: He is alert and oriented to person, place, and time. No cranial nerve deficit.  Skin: Skin is warm and dry. No rash noted. He is not diaphoretic.  Psychiatric: He has a normal mood and affect. His behavior is normal.  Nursing note and vitals reviewed.  Results for orders placed or performed in visit on 08/11/14  HM COLONOSCOPY  Result Value Ref Range   HM Colonoscopy colonoscopy was clear        Assessment & Plan:   1. Anxiety and depression   2. Hypothyroidism due to acquired atrophy of thyroid   3. Essential hypertension, benign   4. Hypothermia, subsequent encounter   5. Chronic pain syndrome   6. Insomnia   7. Degenerative disc disease, lumbar     1. Anxiety and depression: New.  Uncontrolled; rx for Citalopram 20mg  daily provided; continue Lorazepam 1/2 daily yet pt reports no improvement with Lorazepam so can discontinue use if desires. 2.  Hypothyroidism: controlled; pt refused blood work today; continue current medications. 3.  HTN: controlled; pt refused labs; continue current medications. 4.  Hypothermia: resolved. 5.  Chronic pain syndrome/DDD lumbar spine: intolerant to Percocet; desires Hydrocodone 7.5mg  bid; rx provided; follow-up in one month.  Will likely warrant referral back to pain management in future. 6.  Insomnia: moderately controlled with Ambien; no changes to management; treating anxiety should improve insomnia.  Meds ordered this encounter  Medications  .  citalopram (CELEXA) 20 MG tablet    Sig: Take 1 tablet (20 mg total) by mouth daily.    Dispense:  30 tablet    Refill:  3  . HYDROcodone-acetaminophen (NORCO/VICODIN) 5-325 MG per tablet    Sig: Take 1 tablet by mouth 2 (two) times daily.    Dispense:  60 tablet    Refill:  0    Return in about 4 weeks (around 09/16/2014) for recheck.   Dajour Pierpoint Elayne Guerin, M.D. [facility] [address] [place], [place]  [phone]  phone [phone] fax

## 2014-08-20 ENCOUNTER — Encounter: Payer: Self-pay | Admitting: Family Medicine

## 2014-08-20 MED ORDER — BENAZEPRIL-HYDROCHLOROTHIAZIDE 20-25 MG PO TABS: 1.0000 | ORAL_TABLET | Freq: Every day | ORAL | Status: DC

## 2014-08-31 ENCOUNTER — Other Ambulatory Visit: Payer: Medicare Other

## 2014-08-31 ENCOUNTER — Other Ambulatory Visit: Payer: Self-pay | Admitting: Physician Assistant

## 2014-08-31 ENCOUNTER — Ambulatory Visit: Payer: Medicare Other

## 2014-08-31 ENCOUNTER — Other Ambulatory Visit: Payer: Self-pay | Admitting: *Deleted

## 2014-08-31 DIAGNOSIS — Z130 Encounter for screening for diseases of the blood and blood-forming organs and certain disorders involving the immune mechanism: Secondary | ICD-10-CM

## 2014-09-01 ENCOUNTER — Encounter: Payer: Self-pay | Admitting: *Deleted

## 2014-09-09 DIAGNOSIS — R351 Nocturia: Secondary | ICD-10-CM | POA: Diagnosis not present

## 2014-09-09 DIAGNOSIS — N401 Enlarged prostate with lower urinary tract symptoms: Secondary | ICD-10-CM | POA: Diagnosis not present

## 2014-09-18 ENCOUNTER — Other Ambulatory Visit: Payer: Self-pay | Admitting: Family Medicine

## 2014-09-18 MED ORDER — LORAZEPAM 1 MG PO TABS: 1.0000 mg | ORAL_TABLET | Freq: Every day | ORAL | Status: DC | PRN

## 2014-09-18 NOTE — Telephone Encounter (Signed)
Patient's wife Jacqlyn Larsen is calling to follow up on the refill request and wants to know if it's something that can be done today. She was informed that we respond to messages in the order they are received. Becky's number is [phone]

## 2014-09-18 NOTE — Telephone Encounter (Signed)
Faxed refills of Ambien and Lorazepam to pharmacy.

## 2014-09-18 NOTE — Telephone Encounter (Signed)
Patient's wife called back again to let us know that the patient needs a refill for lorazepam sent to [facility] on [address].   She states that he tried taking the Celexa and it didn't work. He experienced sickness, vomiting, and shaking. Please call Jacqlyn Larsen! [phone]

## 2015-02-11 ENCOUNTER — Other Ambulatory Visit: Payer: Self-pay | Admitting: Family Medicine

## 2015-03-08 ENCOUNTER — Ambulatory Visit (INDEPENDENT_AMBULATORY_CARE_PROVIDER_SITE_OTHER): Payer: Medicare Other | Admitting: Family Medicine

## 2015-03-08 ENCOUNTER — Encounter: Payer: Self-pay | Admitting: Family Medicine

## 2015-03-08 VITALS — BP 126/76 | HR 89 | Temp 97.6°F | Resp 16 | Wt 190.0 lb

## 2015-03-08 DIAGNOSIS — G894 Chronic pain syndrome: Secondary | ICD-10-CM | POA: Diagnosis not present

## 2015-03-08 DIAGNOSIS — L989 Disorder of the skin and subcutaneous tissue, unspecified: Secondary | ICD-10-CM | POA: Diagnosis not present

## 2015-03-08 DIAGNOSIS — E038 Other specified hypothyroidism: Secondary | ICD-10-CM

## 2015-03-08 DIAGNOSIS — D509 Iron deficiency anemia, unspecified: Secondary | ICD-10-CM

## 2015-03-08 DIAGNOSIS — E034 Atrophy of thyroid (acquired): Secondary | ICD-10-CM

## 2015-03-08 DIAGNOSIS — I10 Essential (primary) hypertension: Secondary | ICD-10-CM | POA: Diagnosis not present

## 2015-03-08 DIAGNOSIS — G4700 Insomnia, unspecified: Secondary | ICD-10-CM | POA: Diagnosis not present

## 2015-03-08 MED ORDER — BENAZEPRIL-HYDROCHLOROTHIAZIDE 20-25 MG PO TABS: 1.0000 | ORAL_TABLET | Freq: Every day | ORAL | Status: DC

## 2015-03-08 MED ORDER — LORAZEPAM 1 MG PO TABS: 1.0000 mg | ORAL_TABLET | Freq: Every day | ORAL | Status: DC | PRN

## 2015-03-08 MED ORDER — LIDOCAINE 5 % EX PTCH: 1.0000 | MEDICATED_PATCH | CUTANEOUS | Status: DC

## 2015-03-08 MED ORDER — ZOLPIDEM TARTRATE 10 MG PO TABS: 10.0000 mg | ORAL_TABLET | Freq: Every day | ORAL | Status: DC

## 2015-03-08 MED ORDER — LEVOTHYROXINE SODIUM 125 MCG PO TABS: ORAL_TABLET | ORAL | Status: DC

## 2015-03-08 MED ORDER — HYDROCODONE-ACETAMINOPHEN 5-325 MG PO TABS: 1.0000 | ORAL_TABLET | Freq: Two times a day (BID) | ORAL | Status: DC

## 2015-03-08 NOTE — Progress Notes (Signed)
Subjective:    Patient ID: Henry [name], male    DOB: [birth year], 69 y.o.   MRN: [id number]  03/08/2015  Medication Refill and ? Warts on legs   HPI This 69 y.o. male presents for six month follow-up:   1.  Hypothermia:  Resolved.  No recurrence; duration for three months.    2.  Warty lesions: on legs.  Would like lesions looked at to rule out cancer.  Friend had cancer.    3.  HTN: Patient reports good compliance with medication, good tolerance to medication, and good symptom control.  Not checking regularly.  4.  Hypothyroidism: Patient reports good compliance with medication, good tolerance to medication, and good symptom control.    5.  BPH: Patient reports good compliance with medication, good tolerance to medication, and good symptom control.  Taking Flomax 0.4mg  daily.  Seeing urologist on 03/16/15.  6.  Insomnia: taking Ambien 10mg  qhs.  Takes Ambien every night; sleeping really well.  Taking 1/2 Ambien at bedtime; at 3:30am to use restroom; takes another 1/2; gets up at 6:30am.    7.  Anxiety:  Patient reports good compliance with medication, good tolerance to medication, and good symptom control.  Taking Lorazepam 1mg  daily around noon.  8. Chronic pain syndrome: Patient reports good compliance with medication, good tolerance to medication, and good symptom control.  Taking hydrocodone PRN.   9. Iron deficiency: s/p iron infusion last year.  Energy level improved.    Review of Systems  Constitutional: Negative for fever, chills, diaphoresis, activity change, appetite change and fatigue.  Respiratory: Negative for cough and shortness of breath.   Cardiovascular: Negative for chest pain, palpitations and leg swelling.  Gastrointestinal: Negative for nausea, vomiting, abdominal pain and diarrhea.  Endocrine: Negative for cold intolerance, heat intolerance, polydipsia, polyphagia and polyuria.  Skin: Negative for color change, rash and wound.  Neurological: Negative  for dizziness, tremors, seizures, syncope, facial asymmetry, speech difficulty, weakness, light-headedness, numbness and headaches.  Psychiatric/Behavioral: Negative for sleep disturbance and dysphoric mood. The patient is not nervous/anxious.     Past Medical History  Diagnosis Date  . Arthritis   . Hypertension   . Hypothyroidism   . Anxiety   . Degenerative disc disease, lumbar    Past Surgical History  Procedure Laterality Date  . 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 Cholecystectomy    . Colon surgery  08/07/2006    colostomy & small bowel resection  . Spine surgery    . Fracture surgery      left shoulder   Allergies  Allergen Reactions  . Ciprofloxacin Itching and Rash   Current Outpatient Prescriptions  Medication Sig Dispense Refill  . benazepril-hydrochlorthiazide (LOTENSIN HCT) 20-25 MG tablet Take 1 tablet by mouth daily. 90 tablet 1  . cholecalciferol (VITAMIN D) 1000 UNITS tablet Take 1,000 Units by mouth daily.    . citalopram (CELEXA) 20 MG tablet Take 1 tablet (20 mg total) by mouth daily. 30 tablet 3  . fish oil-omega-3 fatty acids 1000 MG capsule Take 2 g by mouth daily.    Marland Kitchen HYDROcodone-acetaminophen (NORCO/VICODIN) 5-325 MG tablet Take 1 tablet by mouth 2 (two) times daily. 60 tablet 0  . levothyroxine (SYNTHROID, LEVOTHROID) 125 MCG tablet TAKE 1 TABLET BY MOUTH EVERY DAY BEFORE BREAKFAST 90 tablet 3  . lidocaine (LIDODERM) 5 % Place 1 patch onto the skin daily. Remove & Discard patch within 12 hours or as directed by MD 30 patch 5  . LORazepam (  ATIVAN) 1 MG tablet Take 1 tablet (1 mg total) by mouth daily as needed for anxiety. 30 tablet 5  . tamsulosin (FLOMAX) 0.4 MG CAPS capsule TAKE ONE CAPSULE BY MOUTH EVERY DAY 90 capsule 0  . zolpidem (AMBIEN) 10 MG tablet Take 1 tablet (10 mg total) by mouth at bedtime. 30 tablet 5   No current facility-administered medications for this visit.   Social History   Social History  . Marital Status: Married     Spouse Name: N/A  . Number of Children: N/A  . Years of Education: N/A   Occupational History  . Not on file.   Social History Main Topics  . Smoking status: Never Smoker   . Smokeless tobacco: Not on file  . Alcohol Use: Not on file  . Drug Use: Not on file  . Sexual Activity: Not on file   Other Topics Concern  . Not on file   Social History Narrative   No family history on file.     Objective:    BP 126/76 mmHg  Pulse 89  Temp(Src) 97.6 F (36.4 C) (Oral)  Resp 16  Wt 190 lb (86.183 kg) Physical Exam  Constitutional: He is oriented to person, place, and time. He appears well-developed and well-nourished. No distress.  HENT:  Head: Normocephalic and atraumatic.  Right Ear: External ear normal.  Left Ear: External ear normal.  Nose: Nose normal.  Mouth/Throat: Oropharynx is clear and moist.  Eyes: Conjunctivae and EOM are normal. Pupils are equal, round, and reactive to light.  Neck: Normal range of motion. Neck supple. Carotid bruit is not present. No thyromegaly present.  Cardiovascular: Normal rate, regular rhythm, normal heart sounds and intact distal pulses.  Exam reveals no gallop and no friction rub.   No murmur heard. Pulmonary/Chest: Effort normal and breath sounds normal. He has no wheezes. He has no rales.  Abdominal: Soft. Bowel sounds are normal. He exhibits no distension and no mass. There is no tenderness. There is no rebound and no guarding.  Lymphadenopathy:    He has no cervical adenopathy.  Neurological: He is alert and oriented to person, place, and time. No cranial nerve deficit.  Skin: Skin is warm and dry. No rash noted. He is not diaphoretic.  Psychiatric: He has a normal mood and affect. His behavior is normal.  Nursing note and vitals reviewed.       Assessment & Plan:   1. Essential hypertension   2. Skin lesion of right leg   3. Hypothyroidism due to acquired atrophy of thyroid   4. Anemia, iron deficiency   5. Insomnia     6. Chronic pain syndrome     Orders Placed This Encounter  Procedures  . CBC with Differential/Platelet  . Comprehensive metabolic panel  . TSH  . T4, free  . Iron  . IBC panel  . Ambulatory referral to Dermatology    Referral Priority:  Routine    Referral Type:  Consultation    Referral Reason:  Specialty Services Required    Requested Specialty:  Dermatology    Number of Visits Requested:  1   Meds ordered this encounter  Medications  . DISCONTD: lidocaine (LIDODERM) 5 %    Sig: Place 1 patch onto the skin daily. Remove & Discard patch within 12 hours or as directed by MD  . HYDROcodone-acetaminophen (NORCO/VICODIN) 5-325 MG tablet    Sig: Take 1 tablet by mouth 2 (two) times daily.    Dispense:  60  tablet    Refill:  0  . LORazepam (ATIVAN) 1 MG tablet    Sig: Take 1 tablet (1 mg total) by mouth daily as needed for anxiety.    Dispense:  30 tablet    Refill:  5  . lidocaine (LIDODERM) 5 %    Sig: Place 1 patch onto the skin daily. Remove & Discard patch within 12 hours or as directed by MD    Dispense:  30 patch    Refill:  5  . zolpidem (AMBIEN) 10 MG tablet    Sig: Take 1 tablet (10 mg total) by mouth at bedtime.    Dispense:  30 tablet    Refill:  5  . levothyroxine (SYNTHROID, LEVOTHROID) 125 MCG tablet    Sig: TAKE 1 TABLET BY MOUTH EVERY DAY BEFORE BREAKFAST    Dispense:  90 tablet    Refill:  3  . benazepril-hydrochlorthiazide (LOTENSIN HCT) 20-25 MG tablet    Sig: Take 1 tablet by mouth daily.    Dispense:  90 tablet    Refill:  1    Return in about 6 months (around 09/05/2015) for complete physical examiniation.    Tyriana Helmkamp Elayne Guerin, M.D. [facility] [address] [place], [place]  [address] [phone] phone [phone] fax

## 2015-03-09 LAB — COMPREHENSIVE METABOLIC PANEL
ALT: 13 U/L (ref 9–46)
AST: 16 U/L (ref 10–35)
Albumin: 4.4 g/dL (ref 3.6–5.1)
Alkaline Phosphatase: 71 U/L (ref 40–115)
BUN: 17 mg/dL (ref 7–25)
CO2: 19 mmol/L — ABNORMAL LOW (ref 20–31)
Calcium: 9.6 mg/dL (ref 8.6–10.3)
Chloride: 102 mmol/L (ref 98–110)
Creat: 1.19 mg/dL (ref 0.70–1.25)
Glucose, Bld: 83 mg/dL (ref 65–99)
Potassium: 4.1 mmol/L (ref 3.5–5.3)
Sodium: 140 mmol/L (ref 135–146)
Total Bilirubin: 1.2 mg/dL (ref 0.2–1.2)
Total Protein: 7.6 g/dL (ref 6.1–8.1)

## 2015-03-09 LAB — CBC WITH DIFFERENTIAL/PLATELET
Basophils Absolute: 0 10*3/uL (ref 0.0–0.1)
Basophils Relative: 0 % (ref 0–1)
Eosinophils Absolute: 0.1 10*3/uL (ref 0.0–0.7)
Eosinophils Relative: 1 % (ref 0–5)
HCT: 45.4 % (ref 39.0–52.0)
Hemoglobin: 15.2 g/dL (ref 13.0–17.0)
Lymphocytes Relative: 35 % (ref 12–46)
Lymphs Abs: 3.6 10*3/uL (ref 0.7–4.0)
MCH: 26.3 pg (ref 26.0–34.0)
MCHC: 33.5 g/dL (ref 30.0–36.0)
MCV: 78.4 fL (ref 78.0–100.0)
MPV: 9.3 fL (ref 8.6–12.4)
Monocytes Absolute: 0.8 10*3/uL (ref 0.1–1.0)
Monocytes Relative: 8 % (ref 3–12)
Neutro Abs: 5.8 10*3/uL (ref 1.7–7.7)
Neutrophils Relative %: 56 % (ref 43–77)
Platelets: 342 10*3/uL (ref 150–400)
RBC: 5.79 MIL/uL (ref 4.22–5.81)
RDW: 14.9 % (ref 11.5–15.5)
WBC: 10.3 10*3/uL (ref 4.0–10.5)

## 2015-03-09 LAB — TSH: TSH: 3.115 u[IU]/mL (ref 0.350–4.500)

## 2015-03-09 LAB — IBC PANEL
%SAT: 11 % — ABNORMAL LOW (ref 15–60)
TIBC: 444 ug/dL — ABNORMAL HIGH (ref 250–425)
UIBC: 394 ug/dL (ref 125–400)

## 2015-03-09 LAB — IRON: Iron: 50 ug/dL (ref 50–180)

## 2015-03-09 LAB — T4, FREE: Free T4: 1.23 ng/dL (ref 0.80–1.80)

## 2015-03-19 ENCOUNTER — Other Ambulatory Visit: Payer: Self-pay | Admitting: Family Medicine

## 2015-03-19 ENCOUNTER — Telehealth: Payer: Self-pay | Admitting: Family Medicine

## 2015-03-19 DIAGNOSIS — I10 Essential (primary) hypertension: Secondary | ICD-10-CM

## 2015-03-19 MED ORDER — BENAZEPRIL-HYDROCHLOROTHIAZIDE 20-25 MG PO TABS: 1.0000 | ORAL_TABLET | Freq: Every day | ORAL | Status: DC

## 2015-03-19 MED ORDER — LEVOTHYROXINE SODIUM 125 MCG PO TABS: ORAL_TABLET | ORAL | Status: DC

## 2015-03-19 MED ORDER — TAMSULOSIN HCL 0.4 MG PO CAPS: 0.4000 mg | ORAL_CAPSULE | Freq: Every day | ORAL | Status: DC

## 2015-03-19 NOTE — Telephone Encounter (Signed)
Resent Lisinopril, Levothyroxine, Flomax.

## 2015-03-19 NOTE — Telephone Encounter (Signed)
It was sent that day.

## 2015-03-19 NOTE — Telephone Encounter (Signed)
Patient's wife states that he was supposed to get a refill on his Lisinopril, Flomax, and Levothyroxine at his visit on 03/08/2015 . [facility] [address]	[place]. Please call his wife Jacqlyn Larsen at [phone]

## 2015-03-23 ENCOUNTER — Encounter: Payer: Self-pay | Admitting: Family Medicine

## 2015-03-23 NOTE — Telephone Encounter (Signed)
Spoke to patient's wife Jacqlyn Larsen and advised her that Dr. Tamala Julian had re-sent the medications to the pharmacy.  She was very appreciative of the call.

## 2015-03-25 ENCOUNTER — Telehealth: Payer: Self-pay

## 2015-03-25 NOTE — Telephone Encounter (Signed)
PA needed for lidocaine patches. Completed on covermymeds using pt's Dx of arthalgias/ pain in multiple joints including hands and feet. Some ins will cover for pain in upper and lower extremity joints BELOW elbows and knees. Ins does NOT cover for back or neck pain. Pending.

## 2015-03-26 NOTE — Telephone Encounter (Signed)
PA denied because use for Dx is not supported by FDA. Many ins plans only cover these patches for post herpetic neuralgia, or possibly other neuropathies. Dr Tamala Julian, do you want to try anything else, or have pt use the OTC 4% lidocaine patches that are now available?

## 2015-03-26 NOTE — Telephone Encounter (Signed)
Please advise patient that PA denied; please recommend OTC Lidocaine patches 4% that are now OTC.

## 2015-03-30 NOTE — Telephone Encounter (Signed)
Called pt and gave info about OTC patches and also gave him Dr Thompson Caul hours at walk in this week. He plans to come see her tomorrow evening.

## 2015-05-12 ENCOUNTER — Other Ambulatory Visit: Payer: Self-pay | Admitting: Family Medicine

## 2015-05-13 ENCOUNTER — Telehealth: Payer: Self-pay

## 2015-05-13 NOTE — Telephone Encounter (Signed)
Payment received and records faxed over on 05/13/15

## 2015-05-13 NOTE — Telephone Encounter (Signed)
Pending payment of $10.00 for 15 pages. Letter faxed on 05/13/2015.

## 2015-05-14 DIAGNOSIS — Z0271 Encounter for disability determination: Secondary | ICD-10-CM

## 2015-06-07 ENCOUNTER — Telehealth: Payer: Self-pay

## 2015-06-07 NOTE — Telephone Encounter (Signed)
Waiting on payment of $70.25 for 159 pages from [facility]

## 2015-06-07 NOTE — Telephone Encounter (Signed)
Payment received and records faxed on 06/07/15

## 2015-06-11 DIAGNOSIS — Z0271 Encounter for disability determination: Secondary | ICD-10-CM

## 2015-07-21 DIAGNOSIS — L570 Actinic keratosis: Secondary | ICD-10-CM | POA: Diagnosis not present

## 2015-07-21 DIAGNOSIS — L281 Prurigo nodularis: Secondary | ICD-10-CM | POA: Diagnosis not present

## 2015-07-21 DIAGNOSIS — D485 Neoplasm of uncertain behavior of skin: Secondary | ICD-10-CM | POA: Diagnosis not present

## 2015-07-21 DIAGNOSIS — B079 Viral wart, unspecified: Secondary | ICD-10-CM | POA: Diagnosis not present

## 2015-07-21 DIAGNOSIS — C434 Malignant melanoma of scalp and neck: Secondary | ICD-10-CM | POA: Diagnosis not present

## 2015-07-21 DIAGNOSIS — C439 Malignant melanoma of skin, unspecified: Secondary | ICD-10-CM

## 2015-07-21 HISTORY — DX: Malignant melanoma of skin, unspecified: C43.9

## 2015-08-24 DIAGNOSIS — L923 Foreign body granuloma of the skin and subcutaneous tissue: Secondary | ICD-10-CM | POA: Diagnosis not present

## 2015-08-24 DIAGNOSIS — C434 Malignant melanoma of scalp and neck: Secondary | ICD-10-CM | POA: Diagnosis not present

## 2015-08-24 DIAGNOSIS — D485 Neoplasm of uncertain behavior of skin: Secondary | ICD-10-CM | POA: Diagnosis not present

## 2015-09-08 ENCOUNTER — Telehealth: Payer: Self-pay

## 2015-09-08 NOTE — Telephone Encounter (Signed)
Pt dropped off handicap form to be completed by Dr Lafe Garin phone for pt is [phone]

## 2015-09-09 ENCOUNTER — Other Ambulatory Visit: Payer: Self-pay | Admitting: Emergency Medicine

## 2015-09-09 NOTE — Telephone Encounter (Signed)
I will place in your box. 

## 2015-09-14 ENCOUNTER — Other Ambulatory Visit: Payer: Self-pay | Admitting: Family Medicine

## 2015-09-14 ENCOUNTER — Telehealth: Payer: Self-pay

## 2015-09-14 NOTE — Telephone Encounter (Signed)
Please call in or fax refill of Ambien.

## 2015-09-14 NOTE — Telephone Encounter (Signed)
Pts wife is calling to check the status of the Handicap sticker and the Ambien. Please let her know when both are ready for pick up because it's past the 24-48hr window.  Advise  [phone]

## 2015-09-15 NOTE — Telephone Encounter (Signed)
Faxed

## 2015-09-17 ENCOUNTER — Encounter: Payer: Medicare Other | Admitting: Family Medicine

## 2015-09-22 ENCOUNTER — Other Ambulatory Visit: Payer: Self-pay | Admitting: Family Medicine

## 2015-09-23 NOTE — Telephone Encounter (Signed)
Please call in refill of Lorazepam to pharmacy.  Please call patient --- he is due for six month follow-up; he will not receive further refills of Lorazepam without appointment. He will need to see me at the walk in clinic since I am booked for this month.

## 2015-09-24 NOTE — Telephone Encounter (Signed)
Faxed RF. Dr Tamala Julian I spoke to wife about f/up, and she reported that he had an appt scheduled for June, but we called and moved it back to Oct for a physical that you had wanted him to come for. Wife wants to know pt has to come in twice? Can you do what is needed for physical at walk in? I see that he is a Medicare pt. Does he need to have a " Medicare PE" for insurance purposes? I know we can not do those at walk in.

## 2015-09-24 NOTE — Telephone Encounter (Signed)
Signed and returned to Ms. Ladell Pier, RN.

## 2015-09-24 NOTE — Telephone Encounter (Signed)
Pt's wife asked me to check on this when I called her about a Rx because pt's placard is about the expire in a few days. I checked Dr Thompson Caul box and I do not see the form there or in nurse's box, TL desk or in drawer. I have filled out a new form after calling pt to verify that he is handicapped due to DDD lumbar and also an ankle bone replacement, both of which limit his ability to walk long distances. I will take it to a PA to sign if possible in Dr Thompson Caul absence, pt is going out of town in the morning and needs to take care of this today.

## 2015-09-24 NOTE — Telephone Encounter (Signed)
Notified pt ready for p/up.

## 2015-09-28 ENCOUNTER — Encounter: Payer: Medicare Other | Admitting: Family Medicine

## 2015-10-28 ENCOUNTER — Other Ambulatory Visit: Payer: Self-pay | Admitting: Family Medicine

## 2015-11-02 NOTE — Telephone Encounter (Signed)
Please call in refill of Lorazepam as approved.  CPE scheduled 01/2016.

## 2015-11-03 NOTE — Telephone Encounter (Signed)
faxed

## 2015-12-21 ENCOUNTER — Other Ambulatory Visit: Payer: Self-pay | Admitting: Family Medicine

## 2015-12-23 NOTE — Telephone Encounter (Signed)
Dr Tamala Julian, Pt had two appts sch in June and we cancelled them. He is now sch to see you 10/13.

## 2015-12-24 NOTE — Telephone Encounter (Signed)
Please call in Lorazepam rx as approved as I am out of the office today.

## 2015-12-27 NOTE — Telephone Encounter (Signed)
Called in.

## 2016-01-10 ENCOUNTER — Ambulatory Visit (INDEPENDENT_AMBULATORY_CARE_PROVIDER_SITE_OTHER): Payer: Medicare Other | Admitting: Physician Assistant

## 2016-01-10 ENCOUNTER — Telehealth: Payer: Self-pay

## 2016-01-10 VITALS — BP 122/72 | HR 90 | Temp 98.9°F | Resp 17 | Ht 65.5 in | Wt 180.0 lb

## 2016-01-10 DIAGNOSIS — R5381 Other malaise: Secondary | ICD-10-CM | POA: Diagnosis not present

## 2016-01-10 DIAGNOSIS — R5383 Other fatigue: Secondary | ICD-10-CM

## 2016-01-10 DIAGNOSIS — J069 Acute upper respiratory infection, unspecified: Secondary | ICD-10-CM

## 2016-01-10 LAB — POCT CBC
Granulocyte percent: 78.8 %G (ref 37–80)
HCT, POC: 42.9 % — AB (ref 43.5–53.7)
Hemoglobin: 14.9 g/dL (ref 14.1–18.1)
Lymph, poc: 1.1 (ref 0.6–3.4)
MCH, POC: 27.2 pg (ref 27–31.2)
MCHC: 34.7 g/dL (ref 31.8–35.4)
MCV: 78.4 fL — AB (ref 80–97)
MID (cbc): 0.5 (ref 0–0.9)
MPV: 7.1 fL (ref 0–99.8)
POC Granulocyte: 5.8 (ref 2–6.9)
POC LYMPH PERCENT: 14.7 %L (ref 10–50)
POC MID %: 6.5 %M (ref 0–12)
Platelet Count, POC: 258 10*3/uL (ref 142–424)
RBC: 5.48 M/uL (ref 4.69–6.13)
RDW, POC: 15.2 %
WBC: 7.3 10*3/uL (ref 4.6–10.2)

## 2016-01-10 LAB — POCT INFLUENZA A/B
Influenza A, POC: NEGATIVE
Influenza B, POC: NEGATIVE

## 2016-01-10 MED ORDER — HYDROCOD POLST-CPM POLST ER 10-8 MG/5ML PO SUER: 5.0000 mL | Freq: Two times a day (BID) | ORAL | 0 refills | Status: DC | PRN

## 2016-01-10 MED ORDER — FLUTICASONE PROPIONATE 50 MCG/ACT NA SUSP: 2.0000 | Freq: Every day | NASAL | 0 refills | Status: DC

## 2016-01-10 MED ORDER — BENZONATATE 100 MG PO CAPS: 100.0000 mg | ORAL_CAPSULE | Freq: Three times a day (TID) | ORAL | 0 refills | Status: DC | PRN

## 2016-01-10 MED ORDER — GUAIFENESIN ER 1200 MG PO TB12
1.0000 | ORAL_TABLET | Freq: Two times a day (BID) | ORAL | 0 refills | Status: DC | PRN
Start: 2016-01-10 — End: 2017-01-17

## 2016-01-10 NOTE — Patient Instructions (Addendum)
-   We will treat this as a respiratory viral infection.  - I recommend you rest, drink plenty of fluids, eat light meals including soups.  - You may use cough syrup at night for your cough and sore throat, Tessalon pearls during the day. Be aware that cough syrup can definitely make you drowsy and sleepy so do not drive or operate any heavy machinery if it is affecting you during the day.  - You may also use Tylenol or ibuprofen over-the-counter for your sore throat.  - Please let me know if you are not seeing any improvement or get worse in 7 days.    IF you received an x-ray today, you will receive an invoice from [facility]. Please contact [facility] at [phone] with questions or concerns regarding your invoice.   IF you received labwork today, you will receive an invoice from [employer]. Please contact [facility] at [phone] with questions or concerns regarding your invoice.   Our billing staff will not be able to assist you with questions regarding bills from these companies.  You will be contacted with the lab results as soon as they are available. The fastest way to get your results is to activate your My Chart account. Instructions are located on the last page of this paperwork. If you have not heard from [place] regarding the results in 2 weeks, please contact this office.

## 2016-01-10 NOTE — Telephone Encounter (Signed)
BARBARA - Pt was just seen and said the pharmacy told him none of the prescriptions are covered by his insurance.  (one script was mucinex which he will get over the counter)

## 2016-01-10 NOTE — Progress Notes (Signed)
MRN: [id number] DOB: 29-Oct-1945  Subjective:   Henry [name] is a 70 y.o. male presenting for chief complaint of Cough; URI; and Fatigue  Reports 3 day history of dry cough, sneezing, subjective fever, headache, rhinorrhea, itchy watery eyes, sore throat, sinus pressure, chills fatigue, and malaise. Notes that he has coughed so hard yesterday that it made him vomit. He has tried trimenica and ibuprofen with minimal relief. Chloraseptic spray has helped with his sore throat. Denies  wheezing, shortness of breath, chest tightness and chest pain, decreased appetite, weight loss, nausea, abdominal pain and diarrhea. He went to visit his friend with COPD in the hospital on Saturday and has been sick ever since so he believes he caught the flu from someone in the [place]. Now his wife and son are both sick with similar complaints but his wife is already getting better. Denies history of seasonal allergies, history of asthma. Patient has not had the flu shot this season. Denies smoking.  Denies any other aggravating or relieving factors, no other questions or concerns.  Henry [name] has a current medication list which includes the following prescription(s): benazepril-hydrochlorthiazide, cholecalciferol, citalopram, fish oil-omega-3 fatty acids, hydrocodone-acetaminophen, levothyroxine, lidocaine, lorazepam, tamsulosin, and zolpidem. Also is allergic to ciprofloxacin.  Henry [name]  has a past medical history of Anxiety; Arthritis; Degenerative disc disease, lumbar; Hypertension; and Hypothyroidism. 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  Objective:   Vitals: BP 122/72 (BP Location: Right Arm, Patient Position: Sitting, Cuff Size: Normal)   Pulse 90   Temp 98.9 F (37.2 C) (Oral)   Resp 17   Ht 5' 5.5" (1.664 m)   Wt 180 lb (81.6 kg)   SpO2 96%   BMI 29.50 kg/m    Physical Exam  Constitutional: He is oriented to person, place, and time. He appears well-developed and well-nourished.  HENT:  Right Ear: Hearing, tympanic membrane, external ear and ear canal normal.  Left Ear: Hearing, tympanic membrane, external ear and ear canal normal.  Nose: Mucosal edema and rhinorrhea present. Right sinus exhibits no maxillary sinus tenderness and no frontal sinus tenderness. Left sinus exhibits no maxillary sinus tenderness and no frontal sinus tenderness.  Eyes: Right conjunctiva is injected. Left conjunctiva is injected.  Cardiovascular: Normal rate, regular rhythm and normal heart sounds.   Pulmonary/Chest: Effort normal and breath sounds normal. He has no wheezes. He has no rhonchi. He has no rales.  Abdominal: Bowel sounds are normal. There is no tenderness.    Lymphadenopathy:       Head (right side): No submental, no submandibular, no tonsillar, no preauricular, no posterior auricular and no occipital adenopathy present.       Head (left side): No submental, no submandibular, no tonsillar, no preauricular, no posterior auricular and no occipital adenopathy present.    He has no cervical adenopathy.       Right: No supraclavicular adenopathy present.       Left: No supraclavicular adenopathy present.  Neurological: He is alert and oriented to person, place, and time.  Skin: Skin is warm and dry.    Results for orders placed or performed in visit on 01/10/16 (from the past 24 hour(s))  POCT CBC     Status: Abnormal   Collection Time: 01/10/16 10:36 AM  Result Value Ref Range   WBC 7.3 4.6 - 10.2 K/uL   Lymph, poc 1.1 0.6 - 3.4   POC LYMPH  PERCENT 14.7 10 - 50 %L   MID (cbc) 0.5 0 - 0.9   POC MID % 6.5 0 - 12 %M   POC Granulocyte 5.8 2 - 6.9   Granulocyte percent 78.8 37 - 80 %G   RBC 5.48 4.69 - 6.13 M/uL   Hemoglobin 14.9 14.1 - 18.1 g/dL   HCT, POC 42.9 (A) 43.5 - 53.7 %   MCV 78.4 (A) 80 - 97 fL   MCH, POC 27.2 27 - 31.2 pg   MCHC 34.7 31.8  - 35.4 g/dL   RDW, POC 15.2 %   Platelet Count, POC 258 142 - 424 K/uL   MPV 7.1 0 - 99.8 fL  POCT Influenza A/B     Status: None   Collection Time: 01/10/16 10:39 AM  Result Value Ref Range   Influenza A, POC Negative Negative   Influenza B, POC Negative Negative    Assessment and Plan :  1. Malaise and fatigue - POCT CBC - POCT Influenza A/B  2. Acute upper respiratory infection -Likely viral, will treat supportively, if no improvement in 7 days, pt instructed that he can return or call me to discuss possibility of antibiotics at that time.  - fluticasone (FLONASE) 50 MCG/ACT nasal spray; Place 2 sprays into both nostrils daily.  Dispense: 16 g; Refill: 0 - benzonatate (TESSALON) 100 MG capsule; Take 1-2 capsules (100-200 mg total) by mouth 3 (three) times daily as needed for cough.  Dispense: 40 capsule; Refill: 0 - chlorpheniramine-HYDROcodone (TUSSIONEX PENNKINETIC ER) 10-8 MG/5ML SUER; Take 5 mLs by mouth every 12 (twelve) hours as needed for cough.  Dispense: 100 mL; Refill: 0 - Guaifenesin (MUCINEX MAXIMUM STRENGTH) 1200 MG TB12; Take 1 tablet (1,200 mg total) by mouth every 12 (twelve) hours as needed.  Dispense: 14 tablet; Refill: 0  Tenna Delaine, PA-C  [facility] 01/10/2016 10:41 AM

## 2016-02-15 ENCOUNTER — Encounter: Payer: Self-pay | Admitting: Family Medicine

## 2016-02-15 ENCOUNTER — Ambulatory Visit (INDEPENDENT_AMBULATORY_CARE_PROVIDER_SITE_OTHER): Payer: Medicare Other | Admitting: Family Medicine

## 2016-02-15 DIAGNOSIS — F5104 Psychophysiologic insomnia: Secondary | ICD-10-CM | POA: Diagnosis not present

## 2016-02-15 DIAGNOSIS — M5136 Other intervertebral disc degeneration, lumbar region: Secondary | ICD-10-CM | POA: Diagnosis not present

## 2016-02-15 DIAGNOSIS — Z125 Encounter for screening for malignant neoplasm of prostate: Secondary | ICD-10-CM | POA: Diagnosis not present

## 2016-02-15 DIAGNOSIS — M7989 Other specified soft tissue disorders: Secondary | ICD-10-CM | POA: Diagnosis not present

## 2016-02-15 DIAGNOSIS — G894 Chronic pain syndrome: Secondary | ICD-10-CM

## 2016-02-15 DIAGNOSIS — D508 Other iron deficiency anemias: Secondary | ICD-10-CM | POA: Diagnosis not present

## 2016-02-15 DIAGNOSIS — Z0000 Encounter for general adult medical examination without abnormal findings: Secondary | ICD-10-CM

## 2016-02-15 DIAGNOSIS — F411 Generalized anxiety disorder: Secondary | ICD-10-CM

## 2016-02-15 DIAGNOSIS — I10 Essential (primary) hypertension: Secondary | ICD-10-CM

## 2016-02-15 DIAGNOSIS — E7800 Pure hypercholesterolemia, unspecified: Secondary | ICD-10-CM | POA: Diagnosis not present

## 2016-02-15 DIAGNOSIS — E034 Atrophy of thyroid (acquired): Secondary | ICD-10-CM

## 2016-02-15 LAB — POCT URINALYSIS DIP (MANUAL ENTRY)
Bilirubin, UA: NEGATIVE
Glucose, UA: NEGATIVE
Leukocytes, UA: NEGATIVE
Nitrite, UA: NEGATIVE
Protein Ur, POC: NEGATIVE
Spec Grav, UA: 1.02
Urobilinogen, UA: 0.2
pH, UA: 5.5

## 2016-02-15 LAB — CBC WITH DIFFERENTIAL/PLATELET
Basophils Absolute: 0 cells/uL (ref 0–200)
Basophils Relative: 0 %
Eosinophils Absolute: 88 cells/uL (ref 15–500)
Eosinophils Relative: 1 %
HCT: 46.4 % (ref 38.5–50.0)
Hemoglobin: 15.6 g/dL (ref 13.2–17.1)
Lymphocytes Relative: 31 %
Lymphs Abs: 2728 cells/uL (ref 850–3900)
MCH: 27 pg (ref 27.0–33.0)
MCHC: 33.6 g/dL (ref 32.0–36.0)
MCV: 80.4 fL (ref 80.0–100.0)
MPV: 9.4 fL (ref 7.5–12.5)
Monocytes Absolute: 968 cells/uL — ABNORMAL HIGH (ref 200–950)
Monocytes Relative: 11 %
Neutro Abs: 5016 cells/uL (ref 1500–7800)
Neutrophils Relative %: 57 %
Platelets: 319 10*3/uL (ref 140–400)
RBC: 5.77 MIL/uL (ref 4.20–5.80)
RDW: 15.4 % — ABNORMAL HIGH (ref 11.0–15.0)
WBC: 8.8 10*3/uL (ref 3.8–10.8)

## 2016-02-15 LAB — COMPREHENSIVE METABOLIC PANEL
ALT: 12 U/L (ref 9–46)
AST: 14 U/L (ref 10–35)
Albumin: 4.5 g/dL (ref 3.6–5.1)
Alkaline Phosphatase: 65 U/L (ref 40–115)
BUN: 17 mg/dL (ref 7–25)
CO2: 22 mmol/L (ref 20–31)
Calcium: 10 mg/dL (ref 8.6–10.3)
Chloride: 101 mmol/L (ref 98–110)
Creat: 1.16 mg/dL (ref 0.70–1.25)
Glucose, Bld: 87 mg/dL (ref 65–99)
Potassium: 3.8 mmol/L (ref 3.5–5.3)
Sodium: 137 mmol/L (ref 135–146)
Total Bilirubin: 1.5 mg/dL — ABNORMAL HIGH (ref 0.2–1.2)
Total Protein: 7.5 g/dL (ref 6.1–8.1)

## 2016-02-15 LAB — LIPID PANEL
Cholesterol: 181 mg/dL (ref 125–200)
HDL: 43 mg/dL (ref >=40)
LDL Cholesterol: 123 mg/dL (ref <130)
Total CHOL/HDL Ratio: 4.2 Ratio (ref <=5.0)
Triglycerides: 77 mg/dL (ref <150)
VLDL: 15 mg/dL (ref <30)

## 2016-02-15 LAB — TSH: TSH: 1.01 mIU/L (ref 0.40–4.50)

## 2016-02-15 LAB — PSA: PSA: 1.9 ng/mL (ref <=4.0)

## 2016-02-15 LAB — T4, FREE: Free T4: 1.3 ng/dL (ref 0.8–1.8)

## 2016-02-15 MED ORDER — ZOLPIDEM TARTRATE 10 MG PO TABS: 10.0000 mg | ORAL_TABLET | Freq: Every day | ORAL | 5 refills | Status: DC

## 2016-02-15 MED ORDER — LEVOTHYROXINE SODIUM 125 MCG PO TABS: ORAL_TABLET | ORAL | 3 refills | Status: DC

## 2016-02-15 MED ORDER — LORAZEPAM 1 MG PO TABS: ORAL_TABLET | ORAL | 3 refills | Status: DC

## 2016-02-15 MED ORDER — TAMSULOSIN HCL 0.4 MG PO CAPS: 0.4000 mg | ORAL_CAPSULE | Freq: Every day | ORAL | 3 refills | Status: DC

## 2016-02-15 MED ORDER — DICLOFENAC SODIUM 1 % TD GEL
2.0000 g | Freq: Four times a day (QID) | TRANSDERMAL | 2 refills | Status: DC
Start: 2016-02-15 — End: 2017-01-17

## 2016-02-15 MED ORDER — HYDROCODONE-ACETAMINOPHEN 5-325 MG PO TABS: 1.0000 | ORAL_TABLET | Freq: Two times a day (BID) | ORAL | 0 refills | Status: DC

## 2016-02-15 MED ORDER — BENAZEPRIL-HYDROCHLOROTHIAZIDE 20-25 MG PO TABS: 1.0000 | ORAL_TABLET | Freq: Every day | ORAL | 1 refills | Status: DC

## 2016-02-15 NOTE — Progress Notes (Signed)
Subjective:    Patient ID: Henry [name], male    DOB: October 03, 1945, 70 y.o.   MRN: [id number]  02/15/2016  Annual Exam   HPI This 70 y.o. male presents for Annual Wellness Examination, Complete Physical Examination, and follow-up of chronic medical conditions.  Last physical: a few years ago. Colonoscopy: 2016 TDAP: 2006 Pneumovax:  refuses Zostavax: refuses Influenza: refuses Eye exam:  2015 Dental exam:  Every six months.  Swelling on first, second, third digits.  Immunization History  Administered Date(s) Administered  . Td 04/17/2004    BP Readings from Last 3 Encounters:  01/10/16 122/72  03/08/15 126/76  08/19/14 118/76   Wt Readings from Last 3 Encounters:  01/10/16 180 lb (81.6 kg)  03/08/15 190 lb (86.2 kg)  08/19/14 192 lb 9.6 oz (87.4 kg)    Review of Systems  Constitutional: Negative for activity change, appetite change, chills, diaphoresis, fatigue, fever and unexpected weight change.  HENT: Negative for congestion, dental problem, drooling, ear discharge, ear pain, facial swelling, hearing loss, mouth sores, nosebleeds, postnasal drip, rhinorrhea, sinus pressure, sneezing, sore throat, tinnitus, trouble swallowing and voice change.   Eyes: Negative for photophobia, pain, discharge, redness, itching and visual disturbance.  Respiratory: Negative for apnea, cough, choking, chest tightness, shortness of breath, wheezing and stridor.   Cardiovascular: Negative for chest pain, palpitations and leg swelling.  Gastrointestinal: Positive for abdominal pain. Negative for abdominal distention, anal bleeding, blood in stool, constipation, diarrhea, nausea and vomiting.  Endocrine: Negative for cold intolerance, heat intolerance, polydipsia, polyphagia and polyuria.  Genitourinary: Negative for decreased urine volume, difficulty urinating, discharge, dysuria, enuresis, flank pain, frequency, genital sores, hematuria, penile pain, penile swelling, scrotal swelling,  testicular pain and urgency.  Musculoskeletal: Positive for arthralgias, back pain and myalgias. Negative for gait problem, joint swelling, neck pain and neck stiffness.  Skin: Negative for color change, pallor, rash and wound.  Allergic/Immunologic: Negative for environmental allergies, food allergies and immunocompromised state.  Neurological: Negative for dizziness, tremors, seizures, syncope, facial asymmetry, speech difficulty, weakness, light-headedness, numbness and headaches.  Hematological: Negative for adenopathy. Does not bruise/bleed easily.  Psychiatric/Behavioral: Negative for agitation, behavioral problems, confusion, decreased concentration, dysphoric mood, hallucinations, self-injury, sleep disturbance and suicidal ideas. The patient is not nervous/anxious and is not hyperactive.     Past Medical History:  Diagnosis Date  . Anxiety   . Arthritis   . Degenerative disc disease, lumbar   . Hypertension   . Hypothyroidism    Past Surgical History:  Procedure Laterality Date  . CHOLECYSTECTOMY    . COLON SURGERY  08/07/2006   colostomy & small bowel resection  . FRACTURE SURGERY     left shoulder  . SPINE SURGERY    . 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 Ciprofloxacin Itching and Rash   Current Outpatient Prescriptions  Medication Sig Dispense Refill  . benazepril-hydrochlorthiazide (LOTENSIN HCT) 20-25 MG tablet Take 1 tablet by mouth daily. 90 tablet 1  . cholecalciferol (VITAMIN D) 1000 UNITS tablet Take 1,000 Units by mouth daily.    . fish oil-omega-3 fatty acids 1000 MG capsule Take 2 g by mouth daily.    Marland Kitchen HYDROcodone-acetaminophen (NORCO/VICODIN) 5-325 MG tablet Take 1 tablet by mouth 2 (two) times daily. 60 tablet 0  . LORazepam (ATIVAN) 1 MG tablet TAKE 1 TABLET BY MOUTH EVERY DAY AS NEEDED FOR ANXIETY 30 tablet 3  . tamsulosin (FLOMAX) 0.4 MG CAPS capsule Take 1 capsule (0.4 mg total) by  mouth daily. 90 capsule 3  .  zolpidem (AMBIEN) 10 MG tablet Take 1 tablet (10 mg total) by mouth at bedtime. 30 tablet 5  . benzonatate (TESSALON) 100 MG capsule Take 1-2 capsules (100-200 mg total) by mouth 3 (three) times daily as needed for cough. (Patient not taking: Reported on 02/15/2016) 40 capsule 0  . chlorpheniramine-HYDROcodone (TUSSIONEX PENNKINETIC ER) 10-8 MG/5ML SUER Take 5 mLs by mouth every 12 (twelve) hours as needed for cough. (Patient not taking: Reported on 02/15/2016) 100 mL 0  . citalopram (CELEXA) 20 MG tablet Take 1 tablet (20 mg total) by mouth daily. (Patient not taking: Reported on 02/15/2016) 30 tablet 3  . diclofenac sodium (VOLTAREN) 1 % GEL Apply 2 g topically 4 (four) times daily. 100 g 2  . fluticasone (FLONASE) 50 MCG/ACT nasal spray Place 2 sprays into both nostrils daily. (Patient not taking: Reported on 02/15/2016) 16 g 0  . Guaifenesin (MUCINEX MAXIMUM STRENGTH) 1200 MG TB12 Take 1 tablet (1,200 mg total) by mouth every 12 (twelve) hours as needed. (Patient not taking: Reported on 02/15/2016) 14 tablet 0  . levothyroxine (SYNTHROID, LEVOTHROID) 125 MCG tablet TAKE 1 TABLET BY MOUTH EVERY DAY BEFORE BREAKFAST 90 tablet 3  . lidocaine (LIDODERM) 5 % Place 1 patch onto the skin daily. Remove & Discard patch within 12 hours or as directed by MD (Patient not taking: Reported on 02/15/2016) 30 patch 5   No current facility-administered medications for this visit.    Social History   Social History  . Marital status: Married    Spouse name: N/A  . Number of children: N/A  . Years of education: N/A   Occupational History  . Not on file.   Social History Main Topics  . Smoking status: Never Smoker  . Smokeless tobacco: Not on file  . Alcohol use Not on file  . Drug use: Unknown  . Sexual activity: Yes   Other Topics Concern  . Not on file   Social History Narrative   Marital status: married x 49 years      Children: 2 children/sons; 2 grandchildren      Lives: with wife       Employment: retired; stays busy      Tobacco: none      Alcohol: 3 alcoholic drinks on weekends      Exercise: stays very active; was swimming but stopped in 2017.  Walking.      Advanced Directives: YES; FULL CODE; no prolonged measures.   Family History  Problem Relation Age of Onset  . Heart disease Mother     CAD and CHF       Objective:    There were no vitals taken for this visit. Physical Exam  Constitutional: He is oriented to person, place, and time. He appears well-developed and well-nourished. No distress.  HENT:  Head: Normocephalic and atraumatic.  Right Ear: External ear normal.  Left Ear: External ear normal.  Nose: Nose normal.  Mouth/Throat: Oropharynx is clear and moist.  Eyes: Conjunctivae and EOM are normal. Pupils are equal, round, and reactive to light.  Neck: Normal range of motion. Neck supple. Carotid bruit is not present. No thyromegaly present.  Cardiovascular: Normal rate, regular rhythm, normal heart sounds and intact distal pulses.  Exam reveals no gallop and no friction rub.   No murmur heard. Pulmonary/Chest: Effort normal and breath sounds normal. He has no wheezes. He has no rales.  Abdominal: Soft. Bowel sounds are normal. He exhibits no  distension and no mass. There is generalized tenderness. There is no rebound and no guarding. Hernia confirmed negative in the right inguinal area and confirmed negative in the left inguinal area.  Genitourinary: Rectum normal, prostate normal, testes normal and penis normal.  Musculoskeletal:       Right shoulder: Normal.       Left shoulder: Normal.       Cervical back: Normal.       Right hand: He exhibits swelling.       Left hand: He exhibits swelling.  Lymphadenopathy:    He has no cervical adenopathy.       Right: No inguinal adenopathy present.       Left: No inguinal adenopathy present.  Neurological: He is alert and oriented to person, place, and time. He has normal reflexes. No cranial nerve  deficit. He exhibits normal muscle tone. Coordination normal.  Skin: Skin is warm and dry. No rash noted. He is not diaphoretic.  Psychiatric: He has a normal mood and affect. His behavior is normal. Judgment and thought content normal.   Fall Risk  03/08/2015 03/23/2014 03/16/2014 03/11/2014  Falls in the past year? No No No No   Depression screen [facility] 2/9 03/08/2015 08/19/2014 03/23/2014 03/16/2014 03/11/2014  Decreased Interest 0 1 0 0 0  Down, Depressed, Hopeless 0 3 0 0 0  PHQ - 2 Score 0 4 0 0 0  Altered sleeping - 3 - - -  Tired, decreased energy - 3 - - -  Change in appetite - 0 - - -  Feeling bad or failure about yourself  - 3 - - -  Trouble concentrating - 1 - - -  Moving slowly or fidgety/restless - 3 - - -  Suicidal thoughts - 0 - - -  PHQ-9 Score - 17 - - -  Difficult doing work/chores - Not difficult at all - - -   Functional Status Survey: Is the patient deaf or have difficulty hearing?: No Does the patient have difficulty seeing, even when wearing glasses/contacts?: No Does the patient have difficulty concentrating, remembering, or making decisions?: No Does the patient have difficulty walking or climbing stairs?: No Does the patient have difficulty dressing or bathing?: No Does the patient have difficulty doing errands alone such as visiting a doctor's office or shopping?: No      Assessment & Plan:   1. Encounter for Medicare annual wellness exam   2. Essential hypertension   3. Degenerative disc disease, lumbar   4. Psychophysiological insomnia   5. Anxiety state   6. Chronic pain syndrome   7. Screening for prostate cancer   8. Hypothyroidism due to acquired atrophy of thyroid   9. Other iron deficiency anemia   10. Pure hypercholesterolemia   11. Swelling of both hands   12. Routine physical examination    -anticipatory guidance provided --- exercise, weight loss, low-calorie food choices. -patient refuses all vaccinations. -moderate risk for fall due  to chronic back and joint pain; no evidence of depression; no hearing loss; no URINARY INCONTINENCE; has advanced directives. -obtain age appropriate screening labs; obtain chronic disease state labs. -refills provided. -using hydrocodone sparingly; using Ativan as needed; taking Ambien nightly and understands risk of medication. -with B hand swelling at MCP joints, obtain labs; rx for Diclofenac gel provided.   Orders Placed This Encounter  Procedures  . CBC with Differential/Platelet  . Comprehensive metabolic panel    Order Specific Question:   Has the patient fasted?  Answer:   Yes  . Lipid panel    Order Specific Question:   Has the patient fasted?    Answer:   Yes  . Iron  . PSA  . TSH  . T4, free  . Rheumatoid factor  . ANA  . POCT urinalysis dipstick   Meds ordered this encounter  Medications  . DISCONTD: zolpidem (AMBIEN) 10 MG tablet    Sig: Take 1 tablet (10 mg total) by mouth at bedtime.    Dispense:  30 tablet    Refill:  5  . tamsulosin (FLOMAX) 0.4 MG CAPS capsule    Sig: Take 1 capsule (0.4 mg total) by mouth daily.    Dispense:  90 capsule    Refill:  3  . DISCONTD: LORazepam (ATIVAN) 1 MG tablet    Sig: TAKE 1 TABLET BY MOUTH EVERY DAY AS NEEDED FOR ANXIETY    Dispense:  30 tablet    Refill:  3    Not to exceed 4 additional fills before 05/01/2016.  Marland Kitchen levothyroxine (SYNTHROID, LEVOTHROID) 125 MCG tablet    Sig: TAKE 1 TABLET BY MOUTH EVERY DAY BEFORE BREAKFAST    Dispense:  90 tablet    Refill:  3  . DISCONTD: HYDROcodone-acetaminophen (NORCO/VICODIN) 5-325 MG tablet    Sig: Take 1 tablet by mouth 2 (two) times daily.    Dispense:  60 tablet    Refill:  0  . benazepril-hydrochlorthiazide (LOTENSIN HCT) 20-25 MG tablet    Sig: Take 1 tablet by mouth daily.    Dispense:  90 tablet    Refill:  1  . HYDROcodone-acetaminophen (NORCO/VICODIN) 5-325 MG tablet    Sig: Take 1 tablet by mouth 2 (two) times daily.    Dispense:  60 tablet    Refill:  0    . LORazepam (ATIVAN) 1 MG tablet    Sig: TAKE 1 TABLET BY MOUTH EVERY DAY AS NEEDED FOR ANXIETY    Dispense:  30 tablet    Refill:  3    Not to exceed 4 additional fills before 05/01/2016.  . zolpidem (AMBIEN) 10 MG tablet    Sig: Take 1 tablet (10 mg total) by mouth at bedtime.    Dispense:  30 tablet    Refill:  5  . diclofenac sodium (VOLTAREN) 1 % GEL    Sig: Apply 2 g topically 4 (four) times daily.    Dispense:  100 g    Refill:  2    Return in about 6 months (around 08/14/2016) for recheck high blood pressure, insomnia, joint pain.   Yuvaan Olander Elayne Guerin, M.D. [facility] [address] [place], [place]  [address] [phone] phone [phone] fax

## 2016-02-15 NOTE — Patient Instructions (Signed)
   IF you received an x-ray today, you will receive an invoice from [facility]. Please contact [facility] at [phone] with questions or concerns regarding your invoice.   IF you received labwork today, you will receive an invoice from [facility]. Please contact [facility] at [phone] with questions or concerns regarding your invoice.   Our billing staff will not be able to assist you with questions regarding bills from these companies.  You will be contacted with the lab results as soon as they are available. The fastest way to get your results is to activate your My Chart account. Instructions are located on the last page of this paperwork. If you have not heard from us regarding the results in 2 weeks, please contact this office.    Keeping you healthy  Get these tests  Blood pressure- Have your blood pressure checked once a year by your healthcare provider.  Normal blood pressure is 120/80  Weight- Have your body mass index (BMI) calculated to screen for obesity.  BMI is a measure of body fat based on height and weight. You can also calculate your own BMI at [email or web address].com/bmi/.  Cholesterol- Have your cholesterol checked every year.  Diabetes- Have your blood sugar checked regularly if you have high blood pressure, high cholesterol, have a family history of diabetes or if you are overweight.  Screening for Colon Cancer- Colonoscopy starting at age 50.  Screening may begin sooner depending on your family history and other health conditions. Follow up colonoscopy as directed by your Gastroenterologist.  Screening for Prostate Cancer- Both blood work (PSA) and a rectal exam help screen for Prostate Cancer.  Screening begins at age 40 with African-American men and at age 50 with Caucasian men.  Screening may begin sooner depending on your family history.  Take these medicines  Aspirin- One aspirin daily can help prevent Heart  disease and Stroke.  Flu shot- Every fall.  Tetanus- Every 10 years.  Zostavax- Once after the age of 60 to prevent Shingles.  Pneumonia shot- Once after the age of 65; if you are younger than 65, ask your healthcare provider if you need a Pneumonia shot.  Take these steps  Don't smoke- If you do smoke, talk to your doctor about quitting.  For tips on how to quit, go to www.smokefree.gov or call 1-800-QUIT-NOW.  Be physically active- Exercise 5 days a week for at least 30 minutes.  If you are not already physically active start slow and gradually work up to 30 minutes of moderate physical activity.  Examples of moderate activity include walking briskly, mowing the yard, dancing, swimming, bicycling, etc.  Eat a healthy diet- Eat a variety of healthy food such as fruits, vegetables, low fat milk, low fat cheese, yogurt, lean meant, poultry, fish, beans, tofu, etc. For more information go to www.thenutritionsource.org  Drink alcohol in moderation- Limit alcohol intake to less than two drinks a day. Never drink and drive.  Dentist- Brush and floss twice daily; visit your dentist twice a year.  Depression- Your emotional health is as important as your physical health. If you're feeling down, or losing interest in things you would normally enjoy please talk to your healthcare provider.  Eye exam- Visit your eye doctor every year.  Safe sex- If you may be exposed to a sexually transmitted infection, use a condom.  Seat belts- Seat belts can save your life; always wear one.  Smoke/Carbon Monoxide detectors- These detectors need to be installed on   the appropriate level of your home.  Replace batteries at least once a year.  Skin cancer- When out in the sun, cover up and use sunscreen 15 SPF or higher.  Violence- If anyone is threatening you, please tell your healthcare provider.  Living Will/ Health care power of attorney- Speak with your healthcare provider and family. 

## 2016-02-16 LAB — IRON: Iron: 83 ug/dL (ref 50–180)

## 2016-02-16 LAB — ANA: Anti Nuclear Antibody(ANA): NEGATIVE

## 2016-02-16 LAB — RHEUMATOID FACTOR: Rhuematoid fact SerPl-aCnc: 22 IU/mL — ABNORMAL HIGH (ref <14)

## 2016-02-18 ENCOUNTER — Telehealth: Payer: Self-pay

## 2016-02-18 NOTE — Telephone Encounter (Signed)
PA approved for diclofenac gel, case # [id number]. I go this approved for the swelling/pain in hand. It is not covered for back pain. Notified pharm.

## 2016-03-13 ENCOUNTER — Encounter: Payer: Self-pay | Admitting: Family Medicine

## 2016-03-17 IMAGING — CR DG LUMBAR SPINE 2-3V
3 series · 3 of 3 positions shown · non-contrast
Comparison: CT of the abdomen and pelvis on 06/10/2009

CLINICAL DATA: back pain after lifting heavy object, hx of fusion
9660.

EXAM:
LUMBAR SPINE - 2-3 VIEW

[AP]
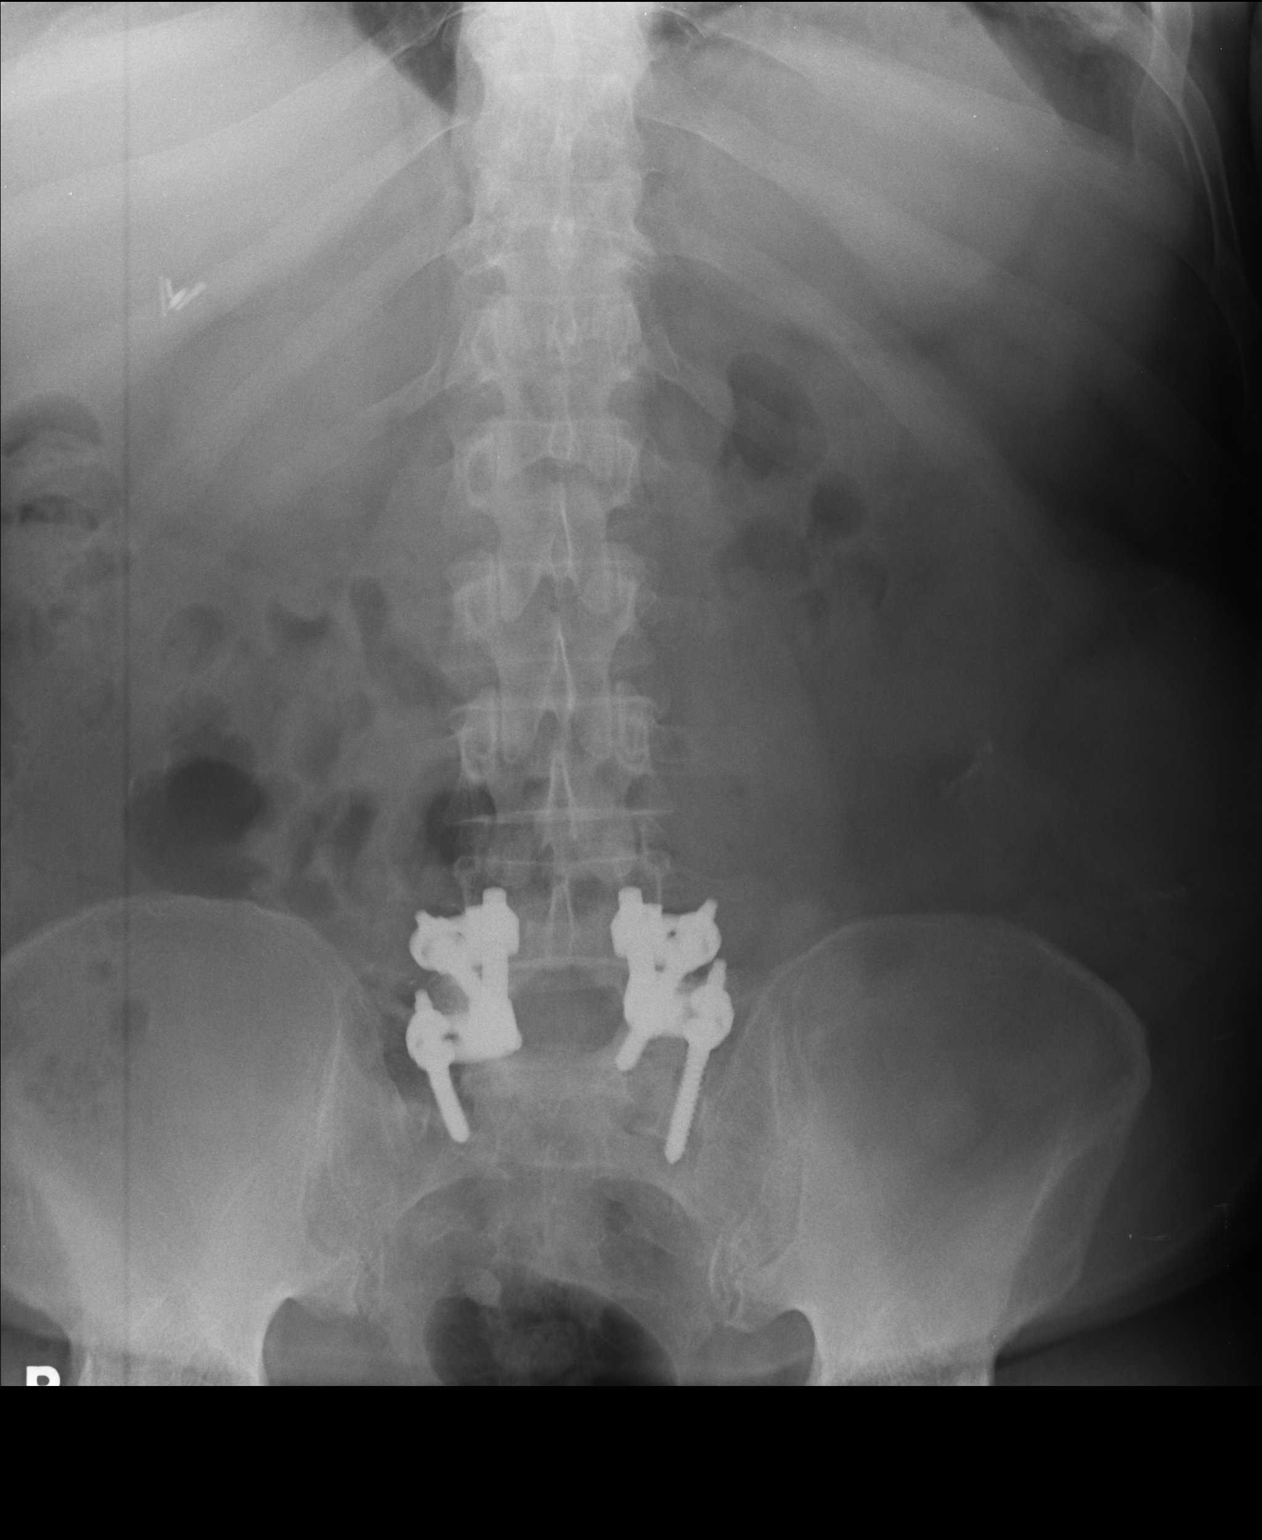

[lateral]
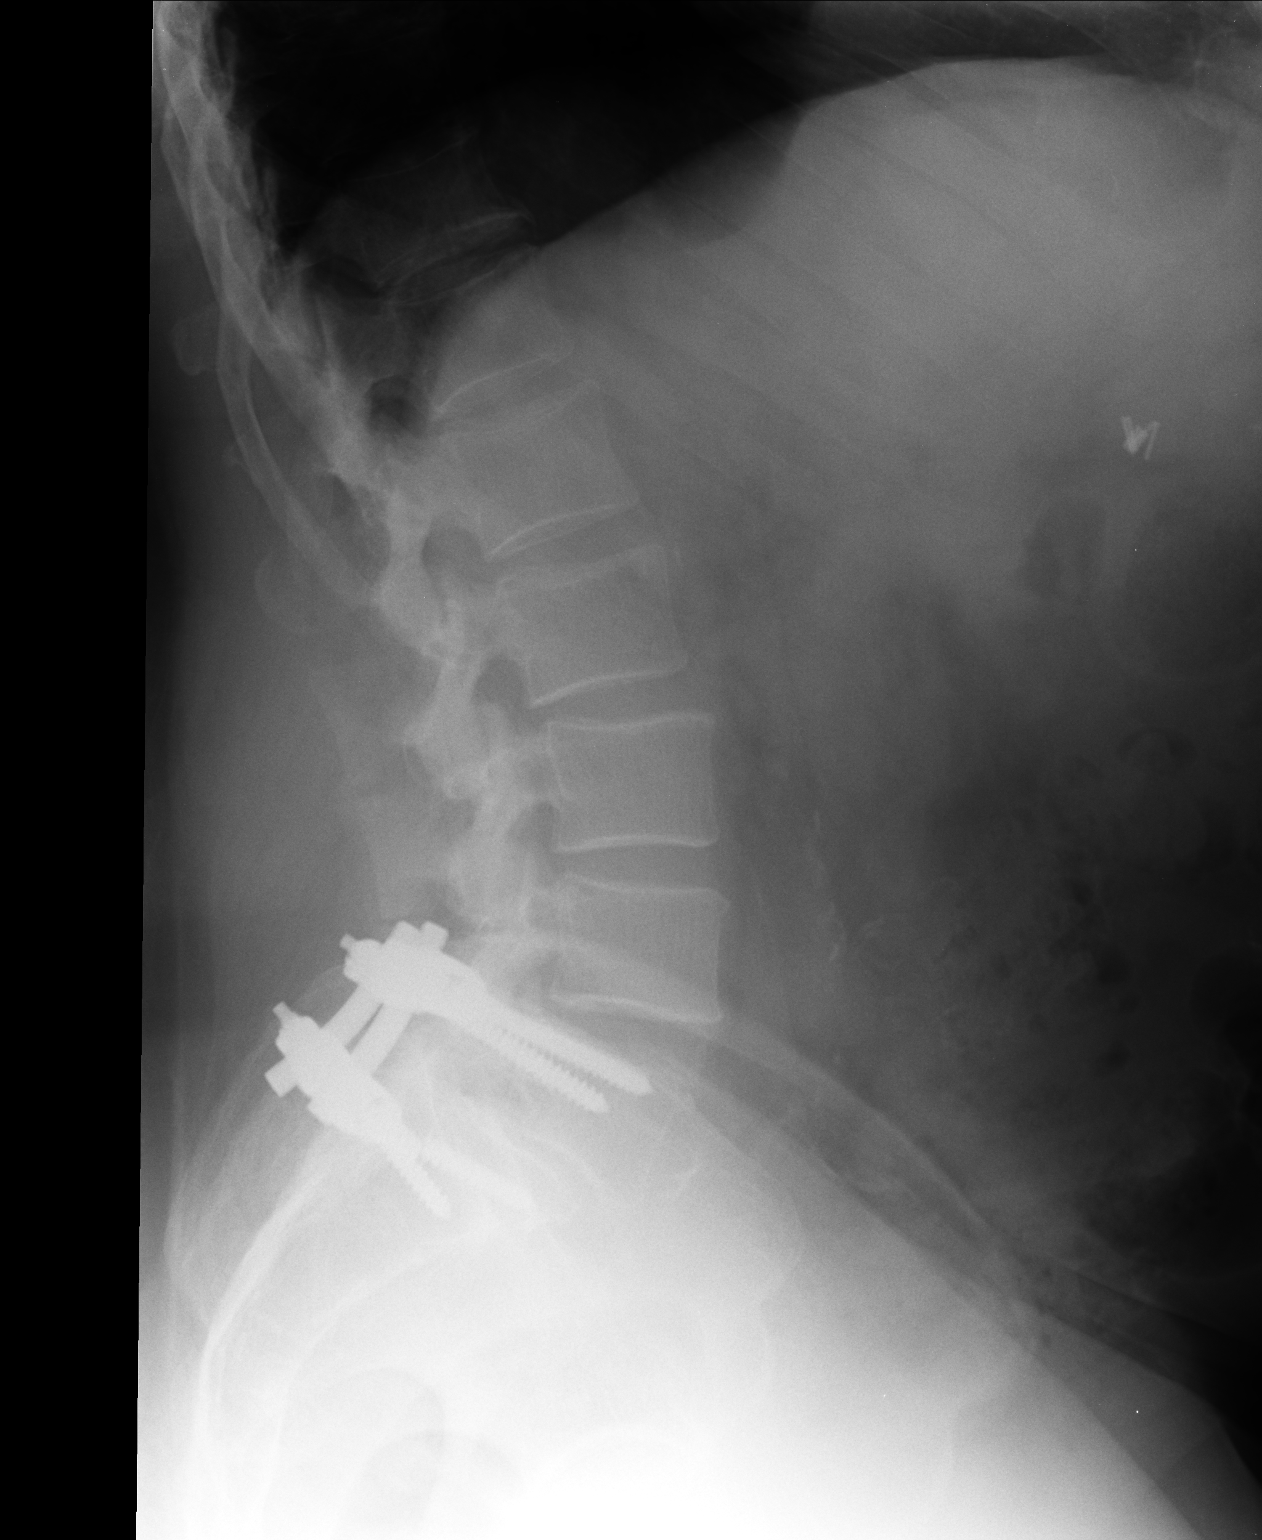

[l5 s1]
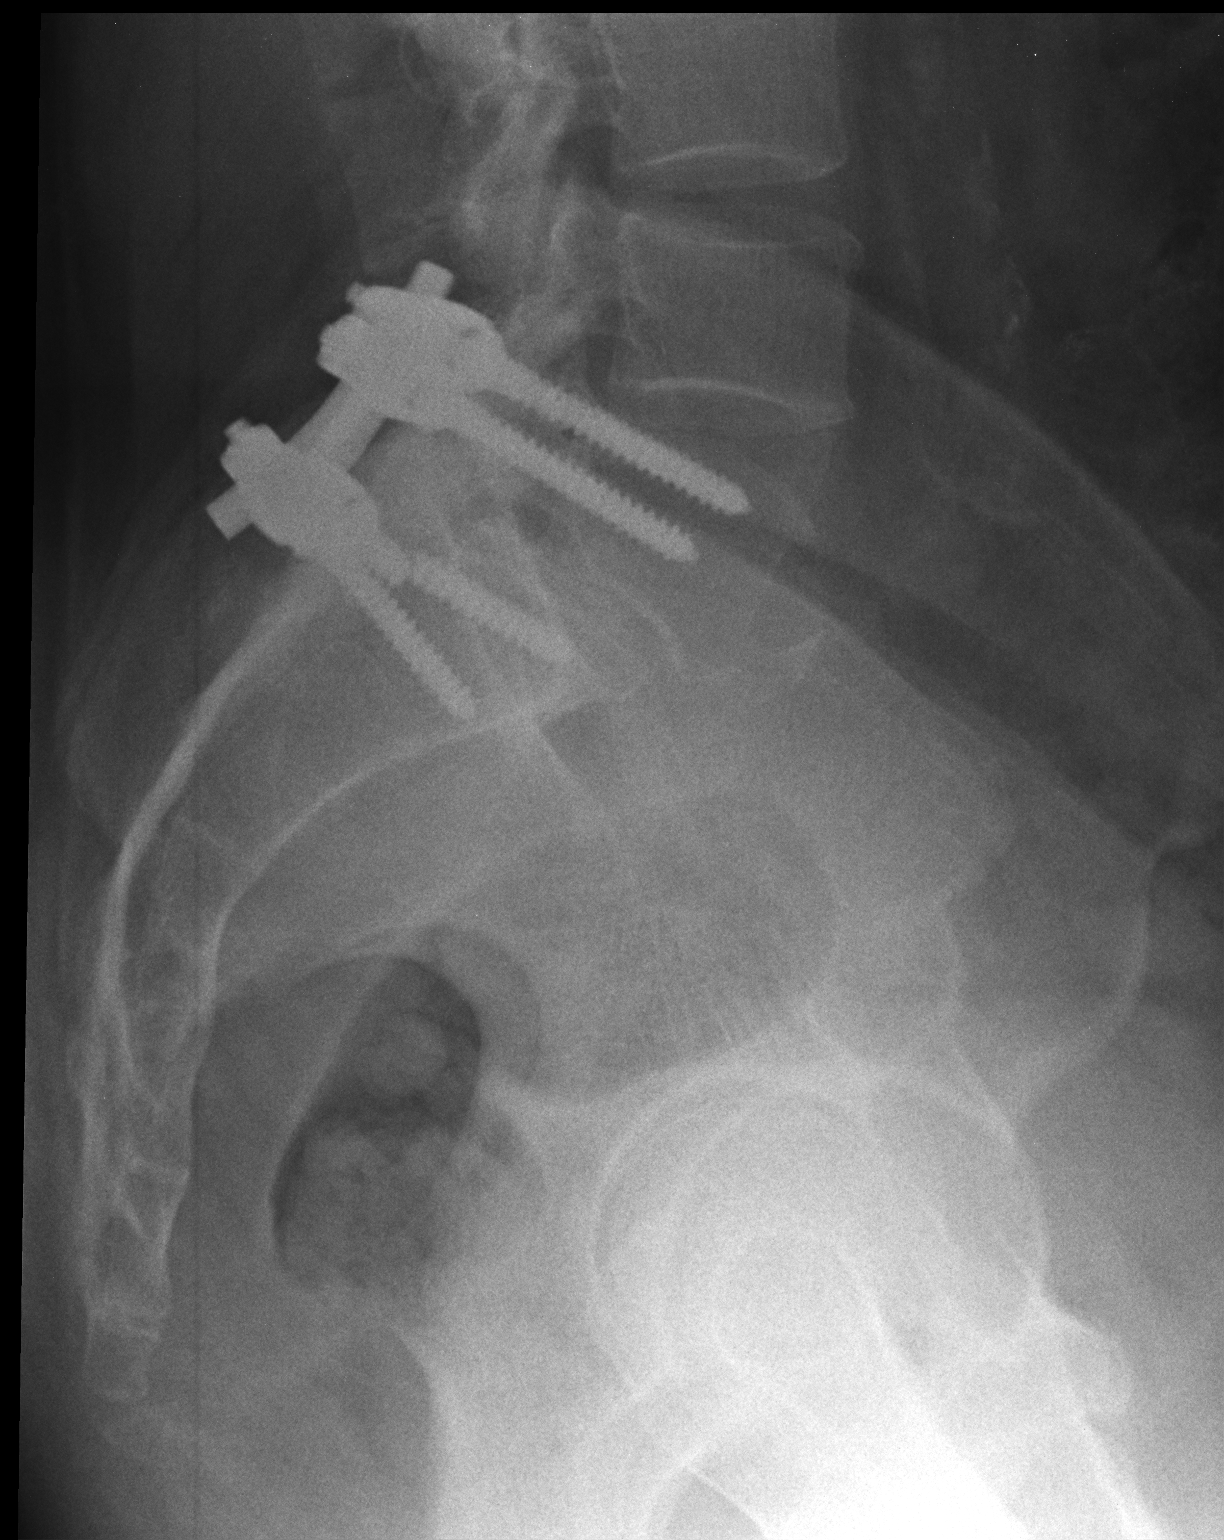

[3 of 3 positions shown; findings below may reference images not displayed]

FINDINGS: The patient has had previous posterior fusion of L5-S1. One of the
S1 pedicular screws is fractured, stable in appearance.
Anterolisthesis of L5 on S1 also appears stable. No new changes in
alignment. No suspicious lytic or blastic lesions are identified.
Surgical clips are noted in the right upper quadrant of the abdomen.
IMPRESSION: Stable appearance of the lumbar spine.

## 2016-04-05 ENCOUNTER — Other Ambulatory Visit: Payer: Self-pay | Admitting: Family Medicine

## 2016-05-07 IMAGING — CR DG CHEST 2V
1 series · 1 of 1 positions shown · non-contrast
Comparison: None.

CLINICAL DATA: Back surgery on 02/13/2014. Fever. Headache, chills.
Weakness. Low temperature at 95 degrees. History of hypertension.
Nonsmoker.

EXAM:
CHEST  2 VIEW

[view not recorded]
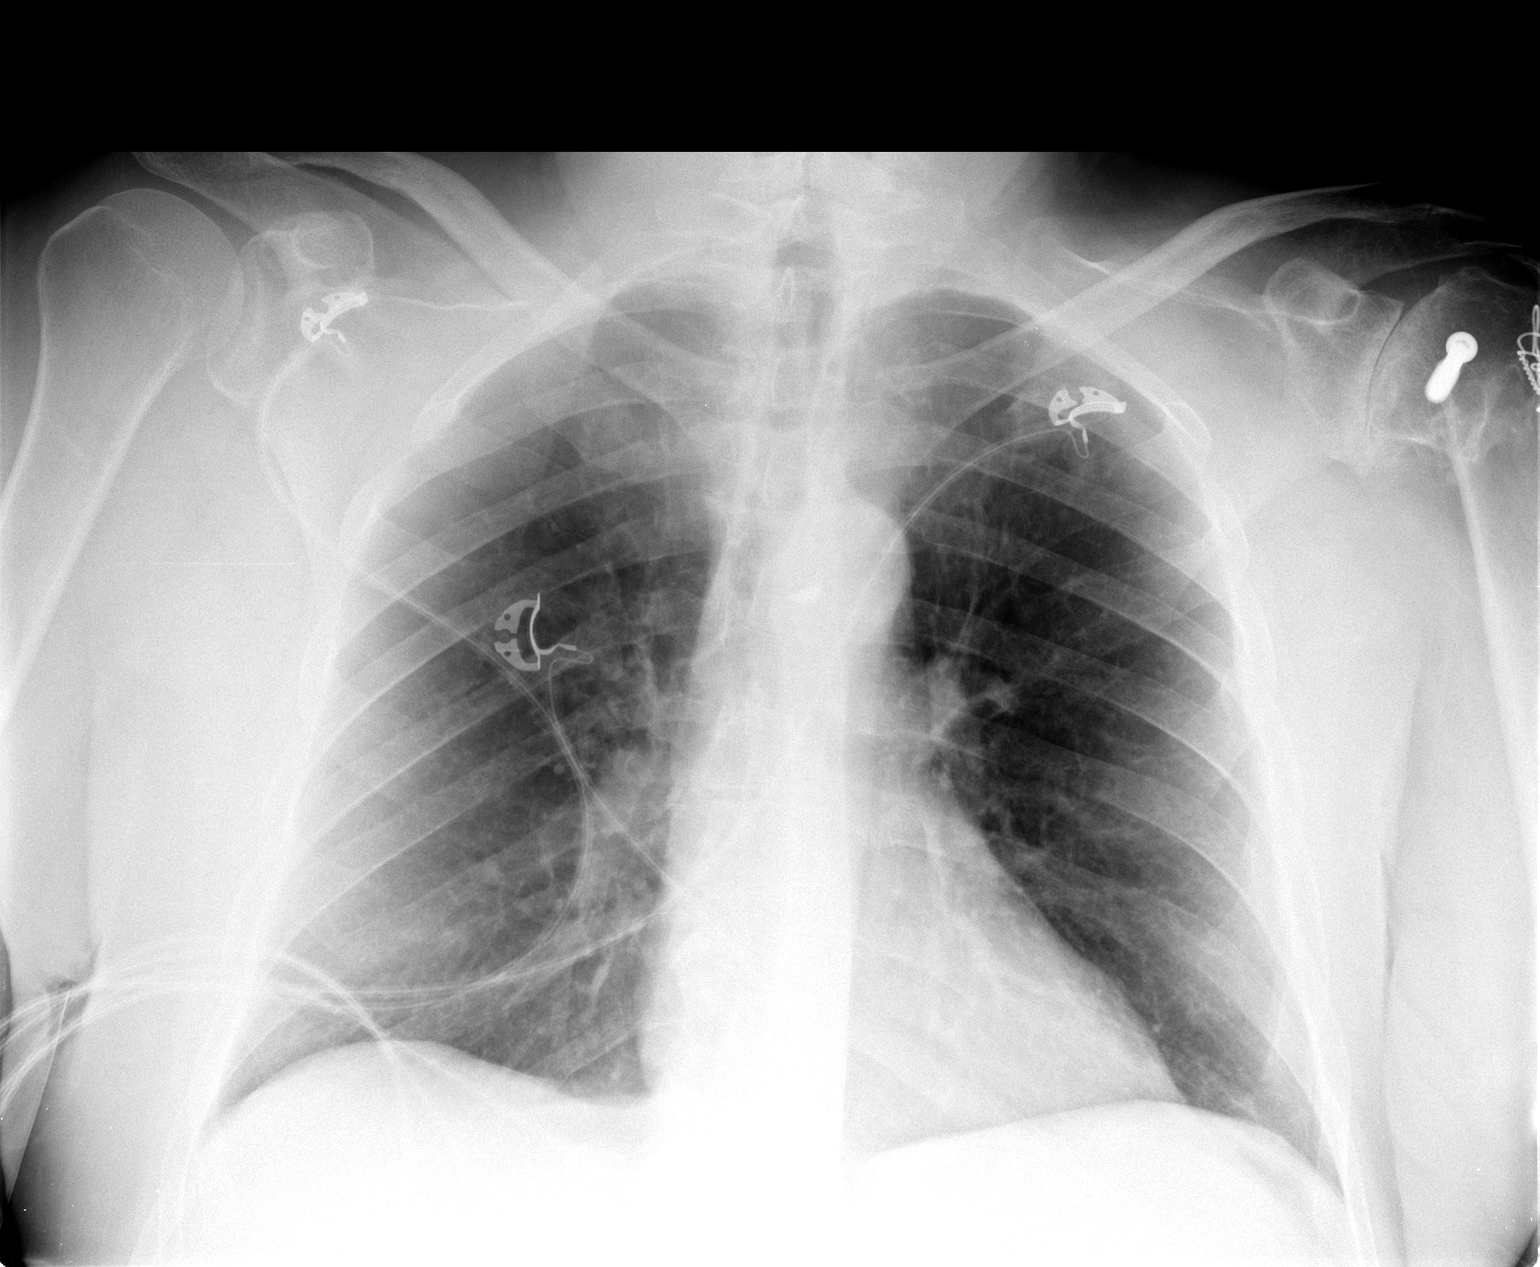

[1 of 1 positions shown; findings below may reference images not displayed]

FINDINGS: The heart size and mediastinal contours are within normal limits.
Both lungs are clear. The visualized skeletal structures are
unremarkable.
IMPRESSION: No active cardiopulmonary disease.

## 2016-07-05 ENCOUNTER — Encounter: Payer: Self-pay | Admitting: Family Medicine

## 2016-08-15 ENCOUNTER — Encounter: Payer: Self-pay | Admitting: Family Medicine

## 2016-08-15 ENCOUNTER — Ambulatory Visit (INDEPENDENT_AMBULATORY_CARE_PROVIDER_SITE_OTHER): Payer: Medicare HMO | Admitting: Family Medicine

## 2016-08-15 VITALS — BP 114/77 | HR 69 | Temp 97.5°F | Resp 16 | Ht 65.5 in | Wt 185.0 lb

## 2016-08-15 DIAGNOSIS — E034 Atrophy of thyroid (acquired): Secondary | ICD-10-CM | POA: Diagnosis not present

## 2016-08-15 DIAGNOSIS — F411 Generalized anxiety disorder: Secondary | ICD-10-CM

## 2016-08-15 DIAGNOSIS — I10 Essential (primary) hypertension: Secondary | ICD-10-CM

## 2016-08-15 DIAGNOSIS — G894 Chronic pain syndrome: Secondary | ICD-10-CM

## 2016-08-15 DIAGNOSIS — F5089 Other specified eating disorder: Secondary | ICD-10-CM

## 2016-08-15 DIAGNOSIS — F4320 Adjustment disorder, unspecified: Secondary | ICD-10-CM

## 2016-08-15 DIAGNOSIS — F4321 Adjustment disorder with depressed mood: Secondary | ICD-10-CM

## 2016-08-15 DIAGNOSIS — F5104 Psychophysiologic insomnia: Secondary | ICD-10-CM | POA: Diagnosis not present

## 2016-08-15 DIAGNOSIS — M5136 Other intervertebral disc degeneration, lumbar region: Secondary | ICD-10-CM | POA: Diagnosis not present

## 2016-08-15 DIAGNOSIS — D500 Iron deficiency anemia secondary to blood loss (chronic): Secondary | ICD-10-CM | POA: Diagnosis not present

## 2016-08-15 DIAGNOSIS — Z1159 Encounter for screening for other viral diseases: Secondary | ICD-10-CM

## 2016-08-15 DIAGNOSIS — K580 Irritable bowel syndrome with diarrhea: Secondary | ICD-10-CM

## 2016-08-15 DIAGNOSIS — R69 Illness, unspecified: Secondary | ICD-10-CM | POA: Diagnosis not present

## 2016-08-15 LAB — POCT URINALYSIS DIP (MANUAL ENTRY)
Bilirubin, UA: NEGATIVE
Blood, UA: NEGATIVE
Glucose, UA: NEGATIVE mg/dL
Ketones, POC UA: NEGATIVE mg/dL
Leukocytes, UA: NEGATIVE
Nitrite, UA: NEGATIVE
Protein Ur, POC: NEGATIVE mg/dL
Spec Grav, UA: 1.01 (ref 1.010–1.025)
Urobilinogen, UA: 0.2 E.U./dL
pH, UA: 7 (ref 5.0–8.0)

## 2016-08-15 MED ORDER — TAMSULOSIN HCL 0.4 MG PO CAPS: 0.4000 mg | ORAL_CAPSULE | Freq: Every day | ORAL | 3 refills | Status: DC

## 2016-08-15 MED ORDER — ONDANSETRON 8 MG PO TBDP: 8.0000 mg | ORAL_TABLET | Freq: Three times a day (TID) | ORAL | 0 refills | Status: DC | PRN

## 2016-08-15 MED ORDER — BENAZEPRIL-HYDROCHLOROTHIAZIDE 20-25 MG PO TABS: 1.0000 | ORAL_TABLET | Freq: Every day | ORAL | 1 refills | Status: DC

## 2016-08-15 MED ORDER — LEVOTHYROXINE SODIUM 125 MCG PO TABS: ORAL_TABLET | ORAL | 3 refills | Status: DC

## 2016-08-15 MED ORDER — DIAZEPAM 5 MG PO TABS: 5.0000 mg | ORAL_TABLET | Freq: Two times a day (BID) | ORAL | 0 refills | Status: DC | PRN

## 2016-08-15 MED ORDER — ZOLPIDEM TARTRATE 10 MG PO TABS: 10.0000 mg | ORAL_TABLET | Freq: Every day | ORAL | 5 refills | Status: DC

## 2016-08-15 NOTE — Patient Instructions (Signed)
     IF you received an x-ray today, you will receive an invoice from [facility]. Please contact [facility] at [phone] with questions or concerns regarding your invoice.   IF you received labwork today, you will receive an invoice from [facility]. Please contact [facility] at [phone] with questions or concerns regarding your invoice.   Our billing staff will not be able to assist you with questions regarding bills from these companies.  You will be contacted with the lab results as soon as they are available. The fastest way to get your results is to activate your My Chart account. Instructions are located on the last page of this paperwork. If you have not heard from us regarding the results in 2 weeks, please contact this office.     

## 2016-08-15 NOTE — Progress Notes (Signed)
Subjective:    Patient ID: Henry [name], male    DOB: 11-Jul-1945, 71 y.o.   MRN: [id number]  08/15/2016  Follow-up (6 mth follow up )   HPI This 71 y.o. male presents for follow-up of hypertension, anxiety/depression, insomnia.  Has been really depressed from the loss of a pet of ten days ago.    Vomiting: has been throwing up a lot.  Onset of vomiting in past week.  Cannot keep self calm; hernias have been swelling; violent dry heaves.  Feels vomiting related to depression.  Has IBS; has diarrhea intermittently.  No SI.  Taking 1/2 Lorazepam around noon.  Intolerant to Prozac and Citalopram in past and intolerant.  Iron deficiency anemia: requesting iron level; feeling weak; changes temperature; no changes in temperature.  Feeling really weak but thinks due to depression.  No motivation; no interest.  Pet passed away one week ago.    HTN: Patient reports good compliance with medication, good tolerance to medication, and good symptom control.  Not checking BP at home.  Immunization History  Administered Date(s) Administered  . Td 04/17/2004   BP Readings from Last 3 Encounters:  08/15/16 114/77  01/10/16 122/72  03/08/15 126/76   Wt Readings from Last 3 Encounters:  08/15/16 185 lb (83.9 kg)  01/10/16 180 lb (81.6 kg)  03/08/15 190 lb (86.2 kg)    Review of Systems  Constitutional: Negative for activity change, appetite change, chills, diaphoresis, fatigue and fever.  Eyes: Negative for visual disturbance.  Respiratory: Negative for cough and shortness of breath.   Cardiovascular: Negative for chest pain, palpitations and leg swelling.  Gastrointestinal: Positive for abdominal pain, diarrhea, nausea and vomiting. Negative for rectal pain.  Endocrine: Negative for cold intolerance, heat intolerance, polydipsia, polyphagia and polyuria.  Musculoskeletal: Positive for arthralgias.  Neurological: Negative for dizziness, tremors, seizures, syncope, facial asymmetry, speech  difficulty, weakness, light-headedness, numbness and headaches.  Psychiatric/Behavioral: Positive for dysphoric mood and sleep disturbance. Negative for confusion.    Past Medical History:  Diagnosis Date  . Anxiety   . Arthritis   . Degenerative disc disease, lumbar   . Hypertension   . Hypothyroidism    Past Surgical History:  Procedure Laterality Date  . CHOLECYSTECTOMY    . COLON SURGERY  08/07/2006   colostomy & small bowel resection  . FRACTURE SURGERY     left shoulder  . SPINE SURGERY    . 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 Ciprofloxacin Itching and Rash    Social History   Social History  . Marital status: Married    Spouse name: N/A  . Number of children: N/A  . Years of education: N/A   Occupational History  . Not on file.   Social History Main Topics  . Smoking status: Never Smoker  . Smokeless tobacco: Never Used  . Alcohol use Not on file  . Drug use: Unknown  . Sexual activity: Yes   Other Topics Concern  . Not on file   Social History Narrative   Marital status: married x 49 years      Children: 2 children/sons; 2 grandchildren      Lives: with wife      Employment: retired; stays busy      Tobacco: none      Alcohol: 3 alcoholic drinks on weekends      Exercise: stays very active; was swimming but stopped in 2017.  Walking.  Advanced Directives: YES; FULL CODE; no prolonged measures.   Family History  Problem Relation Age of Onset  . Heart disease Mother        CAD and CHF       Objective:    BP 114/77 (BP Location: Right Arm, Patient Position: Sitting, Cuff Size: Small)   Pulse 69   Temp 97.5 F (36.4 C) (Oral)   Resp 16   Ht 5' 5.5" (1.664 m)   Wt 185 lb (83.9 kg)   SpO2 98%   BMI 30.32 kg/m  Physical Exam  Constitutional: He is oriented to person, place, and time. He appears well-developed and well-nourished. No distress.  HENT:  Head: Normocephalic and atraumatic.  Right  Ear: External ear normal.  Left Ear: External ear normal.  Nose: Nose normal.  Mouth/Throat: Oropharynx is clear and moist.  Eyes: Conjunctivae and EOM are normal. Pupils are equal, round, and reactive to light.  Neck: Normal range of motion. Neck supple. Carotid bruit is not present. No thyromegaly present.  Cardiovascular: Normal rate, regular rhythm, normal heart sounds and intact distal pulses.  Exam reveals no gallop and no friction rub.   No murmur heard. Pulmonary/Chest: Effort normal and breath sounds normal. He has no wheezes. He has no rales.  Abdominal: Soft. Bowel sounds are normal. He exhibits no distension and no mass. There is generalized tenderness. There is no rebound and no guarding.  Lymphadenopathy:    He has no cervical adenopathy.  Neurological: He is alert and oriented to person, place, and time. No cranial nerve deficit.  Skin: Skin is warm and dry. No rash noted. He is not diaphoretic.  Psychiatric: He has a normal mood and affect. His behavior is normal.  Nursing note and vitals reviewed.   Depression screen [facility] 2/9 08/15/2016 03/08/2015 08/19/2014 03/23/2014 03/16/2014  Decreased Interest 0 0 1 0 0  Down, Depressed, Hopeless 1 0 3 0 0  PHQ - 2 Score 1 0 4 0 0  Altered sleeping - - 3 - -  Tired, decreased energy - - 3 - -  Change in appetite - - 0 - -  Feeling bad or failure about yourself  - - 3 - -  Trouble concentrating - - 1 - -  Moving slowly or fidgety/restless - - 3 - -  Suicidal thoughts - - 0 - -  PHQ-9 Score - - 17 - -  Difficult doing work/chores - - Not difficult at all - -   Fall Risk  08/15/2016 03/08/2015 03/23/2014 03/16/2014 03/11/2014  Falls in the past year? No No No No No       Assessment & Plan:   1. Essential hypertension   2. Degenerative disc disease, lumbar   3. Psychophysiological insomnia   4. Anxiety state   5. Chronic pain syndrome   6. Psychogenic vomiting with nausea   7. Hypothyroidism due to acquired atrophy of thyroid     8. Need for hepatitis C screening test   9. Iron deficiency anemia due to chronic blood loss   10. Grief reaction   11. Irritable bowel syndrome with diarrhea    -new onset grief reaction due to death of dog; counseling provided during visit; recommend using Valium sparingly; pt specifically requested rx for Valium as effective in the past with grief reaction.   -new onset n/v with recent grief; benign abdominal exam in office; obtain labs; rx for Zofran provided; RTC for acute worsening or worsening abdominal pain from baseline chronic abdominal  pain. -blood pressure controlled; obtain labs; refills provided.   Orders Placed This Encounter  Procedures  . CBC with Differential/Platelet  . Comprehensive metabolic panel  . TSH  . T4, free  . Hepatitis C antibody  . Lipase  . Amylase  . Iron  . POCT urinalysis dipstick   Meds ordered this encounter  Medications  . DISCONTD: benazepril-hydrochlorthiazide (LOTENSIN HCT) 20-25 MG tablet    Sig: Take 1 tablet by mouth daily.    Dispense:  90 tablet    Refill:  1  . diazepam (VALIUM) 5 MG tablet    Sig: Take 1 tablet (5 mg total) by mouth every 12 (twelve) hours as needed for anxiety.    Dispense:  30 tablet    Refill:  0  . ondansetron (ZOFRAN-ODT) 8 MG disintegrating tablet    Sig: Take 1 tablet (8 mg total) by mouth every 8 (eight) hours as needed for nausea.    Dispense:  20 tablet    Refill:  0  . benazepril-hydrochlorthiazide (LOTENSIN HCT) 20-25 MG tablet    Sig: Take 1 tablet by mouth daily.    Dispense:  90 tablet    Refill:  1  . levothyroxine (SYNTHROID, LEVOTHROID) 125 MCG tablet    Sig: TAKE 1 TABLET BY MOUTH EVERY DAY BEFORE BREAKFAST    Dispense:  90 tablet    Refill:  3  . tamsulosin (FLOMAX) 0.4 MG CAPS capsule    Sig: Take 1 capsule (0.4 mg total) by mouth daily.    Dispense:  90 capsule    Refill:  3  . zolpidem (AMBIEN) 10 MG tablet    Sig: Take 1 tablet (10 mg total) by mouth at bedtime.    Dispense:   30 tablet    Refill:  5    Return in about 6 months (around 02/15/2017) for complete physical examiniation.   Dearra Myhand Elayne Guerin, M.D. Primary Care at [facility] previously [facility] [place] [address] [place], [place]  [address] [phone] phone [phone] fax

## 2016-08-22 ENCOUNTER — Other Ambulatory Visit: Payer: Medicare HMO | Admitting: Family Medicine

## 2016-08-22 DIAGNOSIS — R69 Illness, unspecified: Secondary | ICD-10-CM | POA: Diagnosis not present

## 2016-08-22 DIAGNOSIS — D508 Other iron deficiency anemias: Secondary | ICD-10-CM

## 2016-08-22 DIAGNOSIS — Z1159 Encounter for screening for other viral diseases: Secondary | ICD-10-CM | POA: Diagnosis not present

## 2016-08-22 DIAGNOSIS — I10 Essential (primary) hypertension: Secondary | ICD-10-CM | POA: Diagnosis not present

## 2016-08-22 DIAGNOSIS — E034 Atrophy of thyroid (acquired): Secondary | ICD-10-CM | POA: Diagnosis not present

## 2016-08-22 DIAGNOSIS — D500 Iron deficiency anemia secondary to blood loss (chronic): Secondary | ICD-10-CM | POA: Diagnosis not present

## 2016-08-23 LAB — HEPATITIS C ANTIBODY: Hep C Virus Ab: <0.1 s/co ratio (ref 0.0–0.9)

## 2016-08-23 LAB — COMPREHENSIVE METABOLIC PANEL
ALT: 14 IU/L (ref 0–44)
AST: 13 IU/L (ref 0–40)
Albumin/Globulin Ratio: 1.5 (ref 1.2–2.2)
Albumin: 4.4 g/dL (ref 3.6–4.8)
Alkaline Phosphatase: 77 IU/L (ref 39–117)
BUN/Creatinine Ratio: 12 (ref 10–24)
BUN: 15 mg/dL (ref 8–27)
Bilirubin Total: 0.7 mg/dL (ref 0.0–1.2)
CO2: 27 mmol/L (ref 18–29)
Calcium: 10.4 mg/dL — ABNORMAL HIGH (ref 8.6–10.2)
Chloride: 98 mmol/L (ref 96–106)
Creatinine, Ser: 1.22 mg/dL (ref 0.76–1.27)
GFR calc Af Amer: 69 mL/min/{1.73_m2} (ref >59)
GFR calc non Af Amer: 60 mL/min/{1.73_m2} (ref >59)
Globulin, Total: 3 g/dL (ref 1.5–4.5)
Glucose: 102 mg/dL — ABNORMAL HIGH (ref 65–99)
Potassium: 4.5 mmol/L (ref 3.5–5.2)
Sodium: 140 mmol/L (ref 134–144)
Total Protein: 7.4 g/dL (ref 6.0–8.5)

## 2016-08-23 LAB — CBC WITH DIFFERENTIAL/PLATELET
Basophils Absolute: 0.1 10*3/uL (ref 0.0–0.2)
Basos: 1 %
EOS (ABSOLUTE): 0.2 10*3/uL (ref 0.0–0.4)
Eos: 3 %
Hematocrit: 47.5 % (ref 37.5–51.0)
Hemoglobin: 15.6 g/dL (ref 13.0–17.7)
Immature Grans (Abs): 0 10*3/uL (ref 0.0–0.1)
Immature Granulocytes: 0 %
Lymphocytes Absolute: 2.3 10*3/uL (ref 0.7–3.1)
Lymphs: 34 %
MCH: 26.8 pg (ref 26.6–33.0)
MCHC: 32.8 g/dL (ref 31.5–35.7)
MCV: 82 fL (ref 79–97)
Monocytes Absolute: 0.5 10*3/uL (ref 0.1–0.9)
Monocytes: 8 %
Neutrophils Absolute: 3.7 10*3/uL (ref 1.4–7.0)
Neutrophils: 54 %
Platelets: 298 10*3/uL (ref 150–379)
RBC: 5.83 x10E6/uL — ABNORMAL HIGH (ref 4.14–5.80)
RDW: 15.1 % (ref 12.3–15.4)
WBC: 6.7 10*3/uL (ref 3.4–10.8)

## 2016-08-23 LAB — AMYLASE: Amylase: 60 U/L (ref 31–124)

## 2016-08-23 LAB — T4, FREE: Free T4: 1.33 ng/dL (ref 0.82–1.77)

## 2016-08-23 LAB — IRON
Iron: 49 ug/dL (ref 38–169)
Iron: 48 ug/dL (ref 38–169)

## 2016-08-23 LAB — LIPASE: Lipase: 46 U/L (ref 13–78)

## 2016-08-23 LAB — TSH: TSH: 2.27 u[IU]/mL (ref 0.450–4.500)

## 2016-08-30 ENCOUNTER — Other Ambulatory Visit: Payer: Medicare HMO | Admitting: Family Medicine

## 2016-09-07 DIAGNOSIS — D500 Iron deficiency anemia secondary to blood loss (chronic): Secondary | ICD-10-CM | POA: Insufficient documentation

## 2016-09-07 DIAGNOSIS — K580 Irritable bowel syndrome with diarrhea: Secondary | ICD-10-CM | POA: Insufficient documentation

## 2016-09-07 DIAGNOSIS — R197 Diarrhea, unspecified: Secondary | ICD-10-CM | POA: Insufficient documentation

## 2016-09-11 NOTE — Progress Notes (Signed)
Lab visit only; no provider encounter. 

## 2017-01-16 ENCOUNTER — Ambulatory Visit (INDEPENDENT_AMBULATORY_CARE_PROVIDER_SITE_OTHER): Payer: Medicare HMO | Admitting: Family Medicine

## 2017-01-16 DIAGNOSIS — Z0000 Encounter for general adult medical examination without abnormal findings: Secondary | ICD-10-CM

## 2017-01-16 NOTE — Progress Notes (Signed)
Subjective:    Patient ID: Henry [name], male    DOB: 17-Apr-1946, 71 y.o.   MRN: [id number]  01/17/2017  Hypertension (6 month follow-up); Hypothyroidism; Anxiety; and Medication Refill (all meds)   HPI This 71 y.o. male presents for Annual Wellness Examination and for six month evaluation of hypertension, hypercholesterolemia, anxiety/depression, insomnia. No changes to management made at last visit.  Presented for labs yesterday; appointment today to review results.  Last physical:  02-15-2016 Colonoscopy:  2016 PSA: n/a  Patient Care Team: Wardell Honour, MD as PCP - General (Family Medicine)   BP Readings from Last 3 Encounters:  01/17/17 122/78  08/15/16 114/77  01/10/16 122/72   Wt Readings from Last 3 Encounters:  01/17/17 193 lb (87.5 kg)  08/15/16 185 lb (83.9 kg)  01/10/16 180 lb (81.6 kg)   Immunization History  Administered Date(s) Administered  . Td 04/17/2004    Review of Systems  Constitutional: Negative for activity change, appetite change, chills, diaphoresis, fatigue, fever and unexpected weight change.  HENT: Negative for congestion, dental problem, drooling, ear discharge, ear pain, facial swelling, hearing loss, mouth sores, nosebleeds, postnasal drip, rhinorrhea, sinus pressure, sneezing, sore throat, tinnitus, trouble swallowing and voice change.   Eyes: Negative for photophobia, pain, discharge, redness, itching and visual disturbance.  Respiratory: Negative for apnea, cough, choking, chest tightness, shortness of breath, wheezing and stridor.   Cardiovascular: Negative for chest pain, palpitations and leg swelling.  Gastrointestinal: Positive for abdominal pain. Negative for blood in stool, constipation, diarrhea, nausea and vomiting.  Endocrine: Negative for cold intolerance, heat intolerance, polydipsia, polyphagia and polyuria.  Genitourinary: Negative for decreased urine volume, difficulty urinating, discharge, dysuria, enuresis, flank  pain, frequency, genital sores, hematuria, penile pain, penile swelling, scrotal swelling, testicular pain and urgency.  Musculoskeletal: Positive for arthralgias. Negative for back pain, gait problem, joint swelling, myalgias, neck pain and neck stiffness.  Skin: Negative for color change, pallor, rash and wound.  Allergic/Immunologic: Negative for environmental allergies, food allergies and immunocompromised state.  Neurological: Negative for dizziness, tremors, seizures, syncope, facial asymmetry, speech difficulty, weakness, light-headedness, numbness and headaches.  Hematological: Negative for adenopathy. Does not bruise/bleed easily.  Psychiatric/Behavioral: Positive for dysphoric mood and sleep disturbance. Negative for agitation, behavioral problems, confusion, decreased concentration, hallucinations, self-injury and suicidal ideas. The patient is nervous/anxious. The patient is not hyperactive.     Past Medical History:  Diagnosis Date  . Anxiety   . Arthritis   . Degenerative disc disease, lumbar   . Hypertension   . Hypothyroidism    Past Surgical History:  Procedure Laterality Date  . CHOLECYSTECTOMY    . COLON SURGERY  08/07/2006   colostomy & small bowel resection  . FRACTURE SURGERY     left shoulder  . SPINE SURGERY    . 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 Ciprofloxacin Itching and Rash    Social History   Social History  . Marital status: Married    Spouse name: N/A  . Number of children: N/A  . Years of education: N/A   Occupational History  . Not on file.   Social History Main Topics  . Smoking status: Never Smoker  . Smokeless tobacco: Never Used  . Alcohol use Not on file  . Drug use: Unknown  . Sexual activity: Yes   Other Topics Concern  . Not on file   Social History Narrative   Marital status: married x 49 years  Children: 2 children/sons; 2 grandchildren      Lives: with wife       Employment: retired; stays busy      Tobacco: none      Alcohol: 3 alcoholic drinks on weekends      Exercise: stays very active; was swimming but stopped in 2017.  Walking.      Advanced Directives: YES; FULL CODE; no prolonged measures.   Family History  Problem Relation Age of Onset  . Heart disease Mother        CAD and CHF       Objective:    BP 122/78   Pulse 79   Temp 98 F (36.7 C) (Oral)   Resp 16   Ht 5' 6.53" (1.69 m)   Wt 193 lb (87.5 kg)   SpO2 94%   BMI 30.65 kg/m  Physical Exam  Constitutional: He is oriented to person, place, and time. He appears well-developed and well-nourished. No distress.  HENT:  Head: Normocephalic and atraumatic.  Right Ear: External ear normal.  Left Ear: External ear normal.  Nose: Nose normal.  Mouth/Throat: Oropharynx is clear and moist.  Eyes: Pupils are equal, round, and reactive to light. Conjunctivae and EOM are normal.  Neck: Normal range of motion. Neck supple. Carotid bruit is not present. No thyromegaly present.  Cardiovascular: Normal rate, regular rhythm, normal heart sounds and intact distal pulses.  Exam reveals no gallop and no friction rub.   No murmur heard. Pulmonary/Chest: Effort normal and breath sounds normal. He has no wheezes. He has no rales.  Abdominal: Soft. Bowel sounds are normal. He exhibits no distension and no mass. There is no tenderness. There is no rebound and no guarding.  Musculoskeletal:       Right shoulder: Normal.       Left shoulder: Normal.       Cervical back: Normal.  Lymphadenopathy:    He has no cervical adenopathy.  Neurological: He is alert and oriented to person, place, and time. He has normal reflexes. No cranial nerve deficit. He exhibits normal muscle tone. Coordination normal.  Skin: Skin is warm and dry. No rash noted. He is not diaphoretic.  Psychiatric: He has a normal mood and affect. His behavior is normal. Judgment and thought content normal.  Nursing note and vitals  reviewed.   No results found. Depression screen [facility] 2/9 01/17/2017 01/17/2017 08/15/2016 03/08/2015 08/19/2014  Decreased Interest 0 0 0 0 1  Down, Depressed, Hopeless 0 0 1 0 3  PHQ - 2 Score 0 0 1 0 4  Altered sleeping - - - - 3  Tired, decreased energy - - - - 3  Change in appetite - - - - 0  Feeling bad or failure about yourself  - - - - 3  Trouble concentrating - - - - 1  Moving slowly or fidgety/restless - - - - 3  Suicidal thoughts - - - - 0  PHQ-9 Score - - - - 17  Difficult doing work/chores - - - - Not difficult at all   Fall Risk  01/17/2017 01/17/2017 08/15/2016 03/08/2015 03/23/2014  Falls in the past year? No No No No No   Functional Status Survey: Is the patient deaf or have difficulty hearing?: Yes Does the patient have difficulty seeing, even when wearing glasses/contacts?: No Does the patient have difficulty concentrating, remembering, or making decisions?: No Does the patient have difficulty walking or climbing stairs?: No Does the patient have difficulty dressing or  bathing?: No Does the patient have difficulty doing errands alone such as visiting a doctor's office or shopping?: No      Assessment & Plan:   1. Medicare annual wellness visit, subsequent   2. Essential hypertension   3. Irritable bowel syndrome with diarrhea   4. Hypothyroidism due to acquired atrophy of thyroid   5. Degenerative disc disease, lumbar   6. Anxiety state   7. Psychophysiological insomnia   8. Chronic pain syndrome   9. Iron deficiency anemia due to chronic blood loss   10. Class 1 obesity due to excess calories with serious comorbidity and body mass index (BMI) of 30.0 to 30.9 in adult    -anticipatory guidance provided --- exercise, weight loss, safe driving practices, aspirin 81mg  daily. -obtain age appropriate screening labs and labs for chronic disease management. -moderate fall risk; no evidence of depression; no evidence of hearing loss.  Discussed advanced directives and  living will; also discussed end of life issues including code status.  -refills provided; using hydrocodone sparingly for chronicc abdominal pain and arthralgias and DDD lumbar spine; using Lorazepam sparingly as well.  Uses Ambien nightly for insomnia. -recommend weight loss, exercise for 30-60 minutes five days per week; recommend 1200 kcal restriction per day with a minimum of 60 grams of protein per day.   No orders of the defined types were placed in this encounter.  Meds ordered this encounter  Medications  . benazepril-hydrochlorthiazide (LOTENSIN HCT) 20-25 MG tablet    Sig: Take 1 tablet by mouth daily.    Dispense:  90 tablet    Refill:  1  . levothyroxine (SYNTHROID, LEVOTHROID) 125 MCG tablet    Sig: TAKE 1 TABLET BY MOUTH EVERY DAY BEFORE BREAKFAST    Dispense:  90 tablet    Refill:  3  . tamsulosin (FLOMAX) 0.4 MG CAPS capsule    Sig: Take 1 capsule (0.4 mg total) by mouth daily.    Dispense:  90 capsule    Refill:  3  . zolpidem (AMBIEN) 10 MG tablet    Sig: Take 1 tablet (10 mg total) by mouth at bedtime.    Dispense:  30 tablet    Refill:  5  . HYDROcodone-acetaminophen (NORCO/VICODIN) 5-325 MG tablet    Sig: Take 1 tablet by mouth 2 (two) times daily.    Dispense:  60 tablet    Refill:  0  . LORazepam (ATIVAN) 1 MG tablet    Sig: TAKE 1 TABLET BY MOUTH EVERY DAY AS NEEDED FOR ANXIETY    Dispense:  30 tablet    Refill:  3    Not to exceed 4 additional fills before 05/01/2016.    Return in about 6 months (around 07/18/2017) for complete physical examiniation.   Phylicia Mcgaugh Elayne Guerin, M.D. Primary Care at [facility] previously [facility] [place] [address] [place], [place]  [address] [phone] phone [phone] fax

## 2017-01-16 NOTE — Progress Notes (Signed)
Lab visit, I did not see this patient.

## 2017-01-17 ENCOUNTER — Encounter: Payer: Self-pay | Admitting: Family Medicine

## 2017-01-17 ENCOUNTER — Ambulatory Visit (INDEPENDENT_AMBULATORY_CARE_PROVIDER_SITE_OTHER): Payer: Medicare HMO | Admitting: Family Medicine

## 2017-01-17 VITALS — BP 122/78 | HR 79 | Temp 98.0°F | Resp 16 | Ht 66.54 in | Wt 193.0 lb

## 2017-01-17 DIAGNOSIS — Z6830 Body mass index (BMI) 30.0-30.9, adult: Secondary | ICD-10-CM

## 2017-01-17 DIAGNOSIS — K580 Irritable bowel syndrome with diarrhea: Secondary | ICD-10-CM

## 2017-01-17 DIAGNOSIS — E6609 Other obesity due to excess calories: Secondary | ICD-10-CM

## 2017-01-17 DIAGNOSIS — E034 Atrophy of thyroid (acquired): Secondary | ICD-10-CM

## 2017-01-17 DIAGNOSIS — F411 Generalized anxiety disorder: Secondary | ICD-10-CM | POA: Diagnosis not present

## 2017-01-17 DIAGNOSIS — I10 Essential (primary) hypertension: Secondary | ICD-10-CM

## 2017-01-17 DIAGNOSIS — G894 Chronic pain syndrome: Secondary | ICD-10-CM

## 2017-01-17 DIAGNOSIS — M5136 Other intervertebral disc degeneration, lumbar region: Secondary | ICD-10-CM

## 2017-01-17 DIAGNOSIS — Z0000 Encounter for general adult medical examination without abnormal findings: Secondary | ICD-10-CM

## 2017-01-17 DIAGNOSIS — D500 Iron deficiency anemia secondary to blood loss (chronic): Secondary | ICD-10-CM

## 2017-01-17 DIAGNOSIS — R69 Illness, unspecified: Secondary | ICD-10-CM | POA: Diagnosis not present

## 2017-01-17 DIAGNOSIS — F5104 Psychophysiologic insomnia: Secondary | ICD-10-CM

## 2017-01-17 LAB — COMPREHENSIVE METABOLIC PANEL
ALT: 10 IU/L (ref 0–44)
AST: 15 IU/L (ref 0–40)
Albumin/Globulin Ratio: 1.9 (ref 1.2–2.2)
Albumin: 4.5 g/dL (ref 3.6–4.8)
Alkaline Phosphatase: 74 IU/L (ref 39–117)
BUN/Creatinine Ratio: 14 (ref 10–24)
BUN: 18 mg/dL (ref 8–27)
Bilirubin Total: 0.8 mg/dL (ref 0.0–1.2)
CO2: 23 mmol/L (ref 20–29)
Calcium: 9.5 mg/dL (ref 8.6–10.2)
Chloride: 103 mmol/L (ref 96–106)
Creatinine, Ser: 1.3 mg/dL — ABNORMAL HIGH (ref 0.76–1.27)
GFR calc Af Amer: 64 mL/min/{1.73_m2} (ref >59)
GFR calc non Af Amer: 56 mL/min/{1.73_m2} — ABNORMAL LOW (ref >59)
Globulin, Total: 2.4 g/dL (ref 1.5–4.5)
Glucose: 99 mg/dL (ref 65–99)
Potassium: 4.1 mmol/L (ref 3.5–5.2)
Sodium: 140 mmol/L (ref 134–144)
Total Protein: 6.9 g/dL (ref 6.0–8.5)

## 2017-01-17 LAB — HEMOGLOBIN A1C
Est. average glucose Bld gHb Est-mCnc: 117 mg/dL
Hgb A1c MFr Bld: 5.7 % — ABNORMAL HIGH (ref 4.8–5.6)

## 2017-01-17 LAB — LIPID PANEL
Chol/HDL Ratio: 4.7 ratio (ref 0.0–5.0)
Cholesterol, Total: 170 mg/dL (ref 100–199)
HDL: 36 mg/dL — ABNORMAL LOW (ref >39)
LDL Calculated: 104 mg/dL — ABNORMAL HIGH (ref 0–99)
Triglycerides: 151 mg/dL — ABNORMAL HIGH (ref 0–149)
VLDL Cholesterol Cal: 30 mg/dL (ref 5–40)

## 2017-01-17 LAB — CBC
Hematocrit: 41.9 % (ref 37.5–51.0)
Hemoglobin: 14.2 g/dL (ref 13.0–17.7)
MCH: 26.2 pg — ABNORMAL LOW (ref 26.6–33.0)
MCHC: 33.9 g/dL (ref 31.5–35.7)
MCV: 77 fL — ABNORMAL LOW (ref 79–97)
Platelets: 302 10*3/uL (ref 150–379)
RBC: 5.41 x10E6/uL (ref 4.14–5.80)
RDW: 14.4 % (ref 12.3–15.4)
WBC: 7.6 10*3/uL (ref 3.4–10.8)

## 2017-01-17 LAB — TSH: TSH: 3.86 u[IU]/mL (ref 0.450–4.500)

## 2017-01-17 MED ORDER — HYDROCODONE-ACETAMINOPHEN 5-325 MG PO TABS: 1.0000 | ORAL_TABLET | Freq: Two times a day (BID) | ORAL | 0 refills | Status: DC

## 2017-01-17 MED ORDER — LORAZEPAM 1 MG PO TABS: ORAL_TABLET | ORAL | 3 refills | Status: DC

## 2017-01-17 MED ORDER — BENAZEPRIL-HYDROCHLOROTHIAZIDE 20-25 MG PO TABS: 1.0000 | ORAL_TABLET | Freq: Every day | ORAL | 1 refills | Status: DC

## 2017-01-17 MED ORDER — ZOLPIDEM TARTRATE 10 MG PO TABS: 10.0000 mg | ORAL_TABLET | Freq: Every day | ORAL | 5 refills | Status: DC

## 2017-01-17 MED ORDER — LEVOTHYROXINE SODIUM 125 MCG PO TABS: ORAL_TABLET | ORAL | 3 refills | Status: DC

## 2017-01-17 MED ORDER — TAMSULOSIN HCL 0.4 MG PO CAPS: 0.4000 mg | ORAL_CAPSULE | Freq: Every day | ORAL | 3 refills | Status: DC

## 2017-01-17 NOTE — Patient Instructions (Signed)
     IF you received an x-ray today, you will receive an invoice from [facility]. Please contact [facility] at [phone] with questions or concerns regarding your invoice.   IF you received labwork today, you will receive an invoice from [facility]. Please contact [facility] at [phone] with questions or concerns regarding your invoice.   Our billing staff will not be able to assist you with questions regarding bills from these companies.  You will be contacted with the lab results as soon as they are available. The fastest way to get your results is to activate your My Chart account. Instructions are located on the last page of this paperwork. If you have not heard from us regarding the results in 2 weeks, please contact this office.     

## 2017-02-12 ENCOUNTER — Telehealth (INDEPENDENT_AMBULATORY_CARE_PROVIDER_SITE_OTHER): Payer: Self-pay | Admitting: Specialist

## 2017-02-12 NOTE — Telephone Encounter (Signed)
Please call pt if an appt opens up earlier than Nov 12th

## 2017-02-13 NOTE — Telephone Encounter (Signed)
Put patient on cancellation list

## 2017-02-16 ENCOUNTER — Telehealth (INDEPENDENT_AMBULATORY_CARE_PROVIDER_SITE_OTHER): Payer: Self-pay | Admitting: Specialist

## 2017-02-16 NOTE — Telephone Encounter (Signed)
Called patient; left message to call back.

## 2017-02-20 ENCOUNTER — Ambulatory Visit: Payer: Medicare HMO | Admitting: Family Medicine

## 2017-02-26 ENCOUNTER — Ambulatory Visit (INDEPENDENT_AMBULATORY_CARE_PROVIDER_SITE_OTHER): Payer: Medicare HMO

## 2017-02-26 ENCOUNTER — Ambulatory Visit (INDEPENDENT_AMBULATORY_CARE_PROVIDER_SITE_OTHER): Payer: Medicare HMO | Admitting: Specialist

## 2017-02-26 ENCOUNTER — Encounter (INDEPENDENT_AMBULATORY_CARE_PROVIDER_SITE_OTHER): Payer: Self-pay | Admitting: Specialist

## 2017-02-26 VITALS — BP 124/71 | HR 78 | Ht 66.0 in | Wt 188.0 lb

## 2017-02-26 DIAGNOSIS — M1712 Unilateral primary osteoarthritis, left knee: Secondary | ICD-10-CM

## 2017-02-26 DIAGNOSIS — M25562 Pain in left knee: Secondary | ICD-10-CM

## 2017-02-26 MED ORDER — MELOXICAM 15 MG PO TABS: 15.0000 mg | ORAL_TABLET | Freq: Every day | ORAL | 1 refills | Status: DC

## 2017-02-26 MED ORDER — TRAMADOL-ACETAMINOPHEN 37.5-325 MG PO TABS: 1.0000 | ORAL_TABLET | Freq: Four times a day (QID) | ORAL | 0 refills | Status: DC | PRN

## 2017-02-26 NOTE — Patient Instructions (Addendum)
  Knee is suffering from osteoarthritis, only real proven treatments are Weight loss, NSIADs like meloxicam  and exercise. Well padded shoes help. Ice the knee 2-3 times a day 15-20 mins at a time. Low impact aerobic. Obtain an off loading brace to decrease stress over the inner knee.  Tramadol for more severe pain. Voltaren gel 4 gm applied 3-4 times per day.

## 2017-02-26 NOTE — Progress Notes (Signed)
Office Visit Note   Patient: Henry [name]           Date of Birth: 1945/04/26           MRN: [id number] Visit Date: 02/26/2017              Requested by: Wardell Honour, MD [address] [place], [place] [address] PCP: Wardell Honour, MD   Assessment & Plan: Visit Diagnoses:  1. Left knee pain, unspecified chronicity   2. Unilateral primary osteoarthritis, left knee     Plan: Knee is suffering from osteoarthritis, only real proven treatments are Weight loss, NSIADs like meloxicam  and exercise. Well padded shoes help. Ice the knee 2-3 times a day 15-20 mins at a time. Low impact aerobic. Obtain an off loading brace to decrease stress over the inner knee.  Tramadol for more severe pain. Voltaren gel 4 gm applied 3-4 times per day.  Follow-Up Instructions: No Follow-up on file.   Orders:  Orders Placed This Encounter  Procedures  . XR Knee 1-2 Views Left   Meds ordered this encounter  Medications  . meloxicam (MOBIC) 15 MG tablet    Sig: Take 1 tablet (15 mg total) daily by mouth.    Dispense:  30 tablet    Refill:  1  . traMADol-acetaminophen (ULTRACET) 37.5-325 MG tablet    Sig: Take 1 tablet every 6 (six) hours as needed by mouth.    Dispense:  30 tablet    Refill:  0      Procedures: No procedures performed   Clinical Data: No additional findings.   Subjective: Chief Complaint  Patient presents with  . Left Knee - Pain    71 year old male with history of neck spondylosis and left shoulder OA post traumatic and a left ankle arthroplasty in 2014. Pain in the left knee brings him in today. He is experiencing left knee pain at night with difficulty sleeping. In the AM it is best, takes a few steps to get going, first standing after sitting. No numbness or tingling. The left knee does not give away. Unable to squat or kneel on the left side. Goes up and down stairs leading with the right. Favors the right side. Not wanting to stress the right too  much. Minor swelling, heat sometimes helps when real severe used ice. Has not Been using arthritis medications, celebrex in the past made him feel sick. No kidney difficulties. Uses flomax. Has taken ibuprofen and tylenol for discomfort. He has digestive problems, constipation And has a shortened colon. Happened in 2008. He has to be careful with pain medcations causes him to stop up. Ibuprofen not as troublesome.     Review of Systems  Constitutional: Positive for activity change. Negative for appetite change, chills, diaphoresis, fatigue, fever and unexpected weight change.  HENT: Positive for hearing loss. Negative for congestion, dental problem, drooling, ear discharge, ear pain, facial swelling, mouth sores, nosebleeds, postnasal drip, rhinorrhea, sinus pressure, sinus pain, sneezing, sore throat, tinnitus, trouble swallowing and voice change.   Eyes: Positive for itching. Negative for photophobia, pain, discharge, redness and visual disturbance.  Respiratory: Negative.   Cardiovascular: Negative.  Negative for chest pain, palpitations and leg swelling.  Gastrointestinal: Positive for abdominal distention and constipation. Negative for abdominal pain, anal bleeding, blood in stool, diarrhea, nausea and rectal pain.  Endocrine: Positive for heat intolerance. Negative for cold intolerance, polydipsia, polyphagia and polyuria.  Genitourinary: Negative for difficulty urinating, dysuria, enuresis, flank pain,  frequency, genital sores and hematuria.  Musculoskeletal: Positive for arthralgias, back pain and gait problem. Negative for joint swelling, myalgias and neck pain.  Skin: Negative for color change, pallor, rash and wound.  Allergic/Immunologic: Negative for environmental allergies, food allergies and immunocompromised state.  Neurological: Negative for dizziness, tremors, seizures, syncope, facial asymmetry, speech difficulty, weakness, light-headedness, numbness and headaches.    Hematological: Negative for adenopathy. Does not bruise/bleed easily.  Psychiatric/Behavioral: Negative for agitation, behavioral problems, confusion, decreased concentration, dysphoric mood, hallucinations, self-injury, sleep disturbance and suicidal ideas. The patient is not nervous/anxious and is not hyperactive.      Objective: Vital Signs: BP 124/71 (BP Location: Left Arm, Patient Position: Sitting)   Pulse 78   Ht 5\' 6"  (1.676 m)   Wt 188 lb (85.3 kg)   BMI 30.34 kg/m   Physical Exam  Constitutional: He is oriented to person, place, and time. He appears well-developed and well-nourished.  HENT:  Head: Normocephalic and atraumatic.  Eyes: EOM are normal. Pupils are equal, round, and reactive to light.  Neck: Normal range of motion. Neck supple.  Pulmonary/Chest: Effort normal and breath sounds normal.  Abdominal: Soft. Bowel sounds are normal.  Musculoskeletal: Normal range of motion.  Neurological: He is alert and oriented to person, place, and time.  Skin: Skin is warm and dry.  Psychiatric: He has a normal mood and affect. His behavior is normal. Judgment and thought content normal.    Right Knee Exam   Range of Motion  The patient has normal right knee ROM. Extension: normal  Flexion: normal    Left Knee Exam   Range of Motion  Extension: -15  Flexion: 120   Tests  McMurray:  Medial - positive Lateral - negative Varus: positive Valgus: negative Lachman:  Anterior - negative    Posterior - negative Drawer:  Anterior - negative     Posterior - negative  Other  Erythema: absent Scars: absent Sensation: normal Pulse: present Swelling: mild      Specialty Comments:  No specialty comments available.  Imaging: Xr Knee 1-2 Views Left  Result Date: 02/26/2017 Standing AP of both knees and lateral of the left knee shows varus deformity of 8-10 degrees, there is joint line narrowing medially, with bone on bone appearance of the medial joint line,  prominence of the median eminences, lateral joint line is widened and the patellofemoral joint shows Superior and inferior pole osteophytes and there is retropatella scalloping And erosion changes.     PMFS History: Patient Active Problem List   Diagnosis Date Noted  . Irritable bowel syndrome with diarrhea 09/07/2016  . Iron deficiency anemia due to chronic blood loss 09/07/2016  . Hypothyroidism due to acquired atrophy of thyroid 08/15/2016  . Degenerative disc disease, lumbar 03/16/2014  . Insomnia 03/16/2014  . Anxiety state 03/16/2014  . Chronic pain syndrome 03/16/2014  . ARTERIOVENOUS MALFORMATION 07/16/2009  . DEPRESSION 07/19/2007  . Essential hypertension 07/19/2007  . DIVERTICULITIS, HX OF 07/19/2007   Past Medical History:  Diagnosis Date  . Anxiety   . Arthritis   . Degenerative disc disease, lumbar   . Hypertension   . Hypothyroidism     Family History  Problem Relation Age of Onset  . Heart disease Mother        CAD and CHF    Past Surgical History:  Procedure Laterality Date  . CHOLECYSTECTOMY    . COLON SURGERY  08/07/2006   colostomy & small bowel resection  . FRACTURE SURGERY  left shoulder  . SPINE SURGERY    . 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 Not on file  Tobacco Use  . Smoking status: Never Smoker  . Smokeless tobacco: Never Used  Substance and Sexual Activity  . Alcohol use: Not on file  . Drug use: Not on file  . Sexual activity: Yes

## 2017-03-01 ENCOUNTER — Telehealth (INDEPENDENT_AMBULATORY_CARE_PROVIDER_SITE_OTHER): Payer: Self-pay | Admitting: Specialist

## 2017-03-01 NOTE — Telephone Encounter (Signed)
02/26/2017 OV NOTE FAXED TO [facility] [phone] °

## 2017-03-21 ENCOUNTER — Other Ambulatory Visit (INDEPENDENT_AMBULATORY_CARE_PROVIDER_SITE_OTHER): Payer: Self-pay | Admitting: Specialist

## 2017-03-21 MED ORDER — MELOXICAM 15 MG PO TABS: 15.0000 mg | ORAL_TABLET | Freq: Every day | ORAL | 3 refills | Status: DC

## 2017-03-23 ENCOUNTER — Other Ambulatory Visit (INDEPENDENT_AMBULATORY_CARE_PROVIDER_SITE_OTHER): Payer: Self-pay | Admitting: Specialist

## 2017-03-23 ENCOUNTER — Telehealth (INDEPENDENT_AMBULATORY_CARE_PROVIDER_SITE_OTHER): Payer: Self-pay | Admitting: Specialist

## 2017-03-23 NOTE — Telephone Encounter (Signed)
Pt wanted to know if he could try a new med. Please call pt to verify medicine.The phone quality was poor couldn't hear very well. Please send med to the same Pharmacy. Pt stated his meds are working very well.  Med  Tramadol  Gel refill

## 2017-03-23 NOTE — Telephone Encounter (Signed)
Patient called wanting to let you know how his medications are working. CB # [phone]

## 2017-03-23 NOTE — Telephone Encounter (Signed)
Patient states he was calling to let Dr Louanne Skye know how his medication was doing. Please call to discuss

## 2017-03-28 NOTE — Telephone Encounter (Signed)
Per pt meds are helping--sent request to dr. Louanne Skye for refills on meds

## 2017-04-02 ENCOUNTER — Telehealth (INDEPENDENT_AMBULATORY_CARE_PROVIDER_SITE_OTHER): Payer: Self-pay | Admitting: Specialist

## 2017-04-02 NOTE — Telephone Encounter (Signed)
Patient called asking about his medications again, requesting a refill on tramadol and meloxicam. CB # [phone]

## 2017-04-04 MED ORDER — TRAMADOL-ACETAMINOPHEN 37.5-325 MG PO TABS: 1.0000 | ORAL_TABLET | Freq: Four times a day (QID) | ORAL | 0 refills | Status: DC | PRN

## 2017-04-04 NOTE — Telephone Encounter (Signed)
Dr Louanne Skye, patient is also asking for Voltaren Gel refill, is this ok to do?   [facility] and [facility] with Rx tramadol.

## 2017-04-05 NOTE — Telephone Encounter (Signed)
meds have been sent in to [facility]

## 2017-04-06 ENCOUNTER — Other Ambulatory Visit (INDEPENDENT_AMBULATORY_CARE_PROVIDER_SITE_OTHER): Payer: Self-pay | Admitting: Specialist

## 2017-04-06 MED ORDER — DICLOFENAC SODIUM 1 % TD GEL: 4.0000 g | Freq: Four times a day (QID) | TRANSDERMAL | 1 refills | Status: DC

## 2017-04-06 NOTE — Telephone Encounter (Signed)
Rx for 1% diclofenac gel 5 tubes sent to his pharmacy, [place]

## 2017-04-18 ENCOUNTER — Encounter: Payer: Medicare HMO | Admitting: Family Medicine

## 2017-05-09 ENCOUNTER — Ambulatory Visit (INDEPENDENT_AMBULATORY_CARE_PROVIDER_SITE_OTHER): Payer: Medicare HMO | Admitting: Specialist

## 2017-05-09 ENCOUNTER — Encounter (INDEPENDENT_AMBULATORY_CARE_PROVIDER_SITE_OTHER): Payer: Self-pay | Admitting: Specialist

## 2017-05-09 VITALS — BP 117/73 | HR 81 | Ht 66.0 in | Wt 188.0 lb

## 2017-05-09 DIAGNOSIS — M19012 Primary osteoarthritis, left shoulder: Secondary | ICD-10-CM | POA: Diagnosis not present

## 2017-05-09 DIAGNOSIS — M1712 Unilateral primary osteoarthritis, left knee: Secondary | ICD-10-CM

## 2017-05-09 MED ORDER — DICLOFENAC SODIUM 75 MG PO TBEC: 75.0000 mg | DELAYED_RELEASE_TABLET | Freq: Two times a day (BID) | ORAL | 3 refills | Status: DC

## 2017-05-09 NOTE — Progress Notes (Signed)
Office Visit Note   Patient: Henry [name]           Date of Birth: Nov 02, 1945           MRN: [id number] Visit Date: 05/09/2017              Requested by: Wardell Honour, MD [address] [place], [place] [address] PCP: Wardell Honour, MD   Assessment & Plan: Visit Diagnoses:  1. Unilateral primary osteoarthritis, left knee   2. Primary osteoarthritis, left shoulder     Plan: Knee is suffering from osteoarthritis, only real proven treatments are Weight loss, NSIADs like diclofenac and exercise. Well padded shoes help. Ice the knee 2-3 times a day 15-20 mins at a time. Shoulder ROM to prevent stiffness.   Follow-Up Instructions: No Follow-up on file.   Orders:  No orders of the defined types were placed in this encounter.  No orders of the defined types were placed in this encounter.     Procedures: No procedures performed   Clinical Data: No additional findings.   Subjective: Chief Complaint  Patient presents with  . Left Knee - Pain, Follow-up    72 year old male with history of left knee osteoarthritis, he has intermittent significant pain. He is not seeing the full benefit of the use of meloxicam. The meloxicam is weak comparitivly  AM stiffness, difficulty squatting or kneeing. Can not squat, its about the same, hurting in the same area and just slight improvement in pain pattern. No cortisone injection in the left knee. He did  Respond to injection of the left shoulder with cortisone, and lasted a long time. The left shoulder is still painful at night and with any ROM. Not ready for surgery, he is using a knee brace to decrease the pain.     Review of Systems  Constitutional: Negative.   HENT: Negative.   Eyes: Negative.   Respiratory: Negative.   Cardiovascular: Negative.   Gastrointestinal: Negative.   Endocrine: Negative.   Genitourinary: Negative.   Musculoskeletal: Negative.   Skin: Negative.   Allergic/Immunologic: Negative.     Neurological: Negative.   Hematological: Negative.   Psychiatric/Behavioral: Negative.      Objective: Vital Signs: BP 117/73 (BP Location: Right Arm, Patient Position: Sitting)   Pulse 81   Ht 5\' 6"  (1.676 m)   Wt 188 lb (85.3 kg)   BMI 30.34 kg/m   Physical Exam  Constitutional: He is oriented to person, place, and time. He appears well-developed and well-nourished.  HENT:  Head: Normocephalic and atraumatic.  Eyes: EOM are normal. Pupils are equal, round, and reactive to light.  Neck: Normal range of motion. Neck supple.  Pulmonary/Chest: Effort normal and breath sounds normal.  Abdominal: Soft. Bowel sounds are normal.  Musculoskeletal:       Left knee: He exhibits no effusion.  Neurological: He is alert and oriented to person, place, and time.  Skin: Skin is warm and dry.  Psychiatric: He has a normal mood and affect. His behavior is normal. Judgment and thought content normal.    Right Knee Exam   Muscle Strength  The patient has normal right knee strength.  Tenderness  The patient is experiencing tenderness in the medial joint line.  Range of Motion  Extension:  0 abnormal  Flexion: 130   Tests  McMurray:  Medial - negative Lateral - negative Varus: negative Valgus: negative Lachman:  Anterior - negative    Posterior - negative Drawer:  Anterior -  negative    Posterior - negative  Other  Erythema: absent Scars: absent   Left Knee Exam   Muscle Strength  The patient has normal left knee strength.  Tenderness  The patient is experiencing tenderness in the medial joint line.  Range of Motion  Extension: -10  Flexion: 120   Tests  McMurray:  Medial - positive Lateral - positive Varus: positive Valgus: positive Lachman:  Anterior - negative    Posterior - negative Drawer:  Anterior - negative     Posterior - negative Pivot shift: positive Patellar apprehension: positive  Other  Erythema: absent Scars: absent Sensation: normal Pulse:  present Swelling: none Effusion: no effusion present      Specialty Comments:  No specialty comments available.  Imaging: No results found.   PMFS History: Patient Active Problem List   Diagnosis Date Noted  . Irritable bowel syndrome with diarrhea 09/07/2016  . Iron deficiency anemia due to chronic blood loss 09/07/2016  . Hypothyroidism due to acquired atrophy of thyroid 08/15/2016  . Degenerative disc disease, lumbar 03/16/2014  . Insomnia 03/16/2014  . Anxiety state 03/16/2014  . Chronic pain syndrome 03/16/2014  . ARTERIOVENOUS MALFORMATION 07/16/2009  . DEPRESSION 07/19/2007  . Essential hypertension 07/19/2007  . DIVERTICULITIS, HX OF 07/19/2007   Past Medical History:  Diagnosis Date  . Anxiety   . Arthritis   . Degenerative disc disease, lumbar   . Hypertension   . Hypothyroidism     Family History  Problem Relation Age of Onset  . Heart disease Mother        CAD and CHF    Past Surgical History:  Procedure Laterality Date  . CHOLECYSTECTOMY    . COLON SURGERY  08/07/2006   colostomy & small bowel resection  . FRACTURE SURGERY     left shoulder  . SPINE SURGERY    . 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 Not on file  Tobacco Use  . Smoking status: Never Smoker  . Smokeless tobacco: Never Used  Substance and Sexual Activity  . Alcohol use: Not on file  . Drug use: Not on file  . Sexual activity: Yes

## 2017-05-09 NOTE — Patient Instructions (Signed)
  Knee is suffering from osteoarthritis, only real proven treatments are Weight loss, NSIADs like diclofenac and exercise. Well padded shoes help. Ice the knee 2-3 times a day 15-20 mins at a time. Shoulder ROM to prevent stiffness.

## 2017-05-15 DIAGNOSIS — M1712 Unilateral primary osteoarthritis, left knee: Secondary | ICD-10-CM | POA: Diagnosis not present

## 2017-06-04 ENCOUNTER — Telehealth (INDEPENDENT_AMBULATORY_CARE_PROVIDER_SITE_OTHER): Payer: Self-pay | Admitting: Specialist

## 2017-06-04 NOTE — Telephone Encounter (Signed)
Made CD, contacted patent to notify it was ready at front for pick up.

## 2017-06-04 NOTE — Telephone Encounter (Signed)
Patient requesting X-ray disc, filled out medical release form 06/04/17. Please call when ready for pick up # [phone]

## 2017-06-06 ENCOUNTER — Ambulatory Visit (INDEPENDENT_AMBULATORY_CARE_PROVIDER_SITE_OTHER): Payer: Medicare HMO | Admitting: Specialist

## 2017-07-11 ENCOUNTER — Ambulatory Visit (INDEPENDENT_AMBULATORY_CARE_PROVIDER_SITE_OTHER): Payer: Medicare HMO | Admitting: Family Medicine

## 2017-07-11 ENCOUNTER — Encounter: Payer: Self-pay | Admitting: Family Medicine

## 2017-07-11 ENCOUNTER — Other Ambulatory Visit: Payer: Self-pay

## 2017-07-11 VITALS — BP 116/70 | Temp 98.0°F | Resp 16 | Ht 67.32 in | Wt 185.0 lb

## 2017-07-11 DIAGNOSIS — G894 Chronic pain syndrome: Secondary | ICD-10-CM

## 2017-07-11 DIAGNOSIS — D500 Iron deficiency anemia secondary to blood loss (chronic): Secondary | ICD-10-CM

## 2017-07-11 DIAGNOSIS — E7800 Pure hypercholesterolemia, unspecified: Secondary | ICD-10-CM

## 2017-07-11 DIAGNOSIS — F5104 Psychophysiologic insomnia: Secondary | ICD-10-CM

## 2017-07-11 DIAGNOSIS — F411 Generalized anxiety disorder: Secondary | ICD-10-CM

## 2017-07-11 DIAGNOSIS — Z125 Encounter for screening for malignant neoplasm of prostate: Secondary | ICD-10-CM

## 2017-07-11 DIAGNOSIS — Z0000 Encounter for general adult medical examination without abnormal findings: Secondary | ICD-10-CM

## 2017-07-11 DIAGNOSIS — Z6828 Body mass index (BMI) 28.0-28.9, adult: Secondary | ICD-10-CM | POA: Diagnosis not present

## 2017-07-11 DIAGNOSIS — I10 Essential (primary) hypertension: Secondary | ICD-10-CM | POA: Diagnosis not present

## 2017-07-11 DIAGNOSIS — M5136 Other intervertebral disc degeneration, lumbar region: Secondary | ICD-10-CM

## 2017-07-11 DIAGNOSIS — K580 Irritable bowel syndrome with diarrhea: Secondary | ICD-10-CM | POA: Diagnosis not present

## 2017-07-11 DIAGNOSIS — E034 Atrophy of thyroid (acquired): Secondary | ICD-10-CM | POA: Diagnosis not present

## 2017-07-11 DIAGNOSIS — R69 Illness, unspecified: Secondary | ICD-10-CM | POA: Diagnosis not present

## 2017-07-11 LAB — POCT URINALYSIS DIP (MANUAL ENTRY)
Bilirubin, UA: NEGATIVE
Glucose, UA: NEGATIVE mg/dL
Ketones, POC UA: NEGATIVE mg/dL
Leukocytes, UA: NEGATIVE
Nitrite, UA: NEGATIVE
Protein Ur, POC: NEGATIVE mg/dL
Spec Grav, UA: 1.015 (ref 1.010–1.025)
Urobilinogen, UA: 0.2 E.U./dL
pH, UA: 7 (ref 5.0–8.0)

## 2017-07-11 MED ORDER — BENAZEPRIL-HYDROCHLOROTHIAZIDE 20-25 MG PO TABS: 1.0000 | ORAL_TABLET | Freq: Every day | ORAL | 1 refills | Status: DC

## 2017-07-11 MED ORDER — LORAZEPAM 1 MG PO TABS: ORAL_TABLET | ORAL | 3 refills | Status: DC

## 2017-07-11 MED ORDER — ZOLPIDEM TARTRATE 10 MG PO TABS: 10.0000 mg | ORAL_TABLET | Freq: Every day | ORAL | 5 refills | Status: DC

## 2017-07-11 MED ORDER — HYDROCODONE-ACETAMINOPHEN 5-325 MG PO TABS: 1.0000 | ORAL_TABLET | Freq: Four times a day (QID) | ORAL | 0 refills | Status: DC | PRN

## 2017-07-11 MED ORDER — FLOMAX 0.4 MG PO CAPS: 0.4000 mg | ORAL_CAPSULE | Freq: Every day | ORAL | 3 refills | Status: DC

## 2017-07-11 NOTE — Patient Instructions (Addendum)
   IF you received an x-ray today, you will receive an invoice from [facility]. Please contact [facility] at [phone] with questions or concerns regarding your invoice.   IF you received labwork today, you will receive an invoice from [facility]. Please contact [facility] at [phone] with questions or concerns regarding your invoice.   Our billing staff will not be able to assist you with questions regarding bills from these companies.  You will be contacted with the lab results as soon as they are available. The fastest way to get your results is to activate your My Chart account. Instructions are located on the last page of this paperwork. If you have not heard from us regarding the results in 2 weeks, please contact this office.      Preventive Care 70 Years and Older, Male Preventive care refers to lifestyle choices and visits with your health care provider that can promote health and wellness. What does preventive care include?  A yearly physical exam. This is also called an annual well check.  Dental exams once or twice a year.  Routine eye exams. Ask your health care provider how often you should have your eyes checked.  Personal lifestyle choices, including: ? Daily care of your teeth and gums. ? Regular physical activity. ? Eating a healthy diet. ? Avoiding tobacco and drug use. ? Limiting alcohol use. ? Practicing safe sex. ? Taking low doses of aspirin every day. ? Taking vitamin and mineral supplements as recommended by your health care provider. What happens during an annual well check? The services and screenings done by your health care provider during your annual well check will depend on your age, overall health, lifestyle risk factors, and family history of disease. Counseling Your health care provider may ask you questions about your:  Alcohol use.  Tobacco use.  Drug use.  Emotional well-being.  Home and relationship  well-being.  Sexual activity.  Eating habits.  History of falls.  Memory and ability to understand (cognition).  Work and work environment.  Screening You may have the following tests or measurements:  Height, weight, and BMI.  Blood pressure.  Lipid and cholesterol levels. These may be checked every 5 years, or more frequently if you are over 50 years old.  Skin check.  Lung cancer screening. You may have this screening every year starting at age 55 if you have a 30-pack-year history of smoking and currently smoke or have quit within the past 15 years.  Fecal occult blood test (FOBT) of the stool. You may have this test every year starting at age 50.  Flexible sigmoidoscopy or colonoscopy. You may have a sigmoidoscopy every 5 years or a colonoscopy every 10 years starting at age 50.  Prostate cancer screening. Recommendations will vary depending on your family history and other risks.  Hepatitis C blood test.  Hepatitis B blood test.  Sexually transmitted disease (STD) testing.  Diabetes screening. This is done by checking your blood sugar (glucose) after you have not eaten for a while (fasting). You may have this done every 1-3 years.  Abdominal aortic aneurysm (AAA) screening. You may need this if you are a current or former smoker.  Osteoporosis. You may be screened starting at age 70 if you are at high risk.  Talk with your health care provider about your test results, treatment options, and if necessary, the need for more tests. Vaccines Your health care provider may recommend certain vaccines, such as:  Influenza vaccine. This   is recommended every year.  Tetanus, diphtheria, and acellular pertussis (Tdap, Td) vaccine. You may need a Td booster every 10 years.  Varicella vaccine. You may need this if you have not been vaccinated.  Zoster vaccine. You may need this after age 60.  Measles, mumps, and rubella (MMR) vaccine. You may need at least one dose of  MMR if you were born in 1957 or later. You may also need a second dose.  Pneumococcal 13-valent conjugate (PCV13) vaccine. One dose is recommended after age 65.  Pneumococcal polysaccharide (PPSV23) vaccine. One dose is recommended after age 65.  Meningococcal vaccine. You may need this if you have certain conditions.  Hepatitis A vaccine. You may need this if you have certain conditions or if you travel or work in places where you may be exposed to hepatitis A.  Hepatitis B vaccine. You may need this if you have certain conditions or if you travel or work in places where you may be exposed to hepatitis B.  Haemophilus influenzae type b (Hib) vaccine. You may need this if you have certain risk factors.  Talk to your health care provider about which screenings and vaccines you need and how often you need them. This information is not intended to replace advice given to you by your health care provider. Make sure you discuss any questions you have with your health care provider. Document Released: 04/30/2015 Document Revised: 12/22/2015 Document Reviewed: 02/02/2015 Elsevier Interactive Patient Education  2018 [employer]  

## 2017-07-11 NOTE — Progress Notes (Signed)
Subjective:    Patient ID: Henry [name], male    DOB: 18-Jun-1945, 72 y.o.   MRN: [id number]  07/11/2017  Annual Exam    HPI This 72 y.o. male presents for [place], COMPLETE PHYSICAL EXAMINATION, and six month follow-up of hypertension, hypercholesterolemia, anxiety/depression, insomnia.  Last physical:  AWV 01-2017 Colonoscopy:   2016 PSA: 01-2016   Visual Acuity Screening   Right eye Left eye Both eyes  Without correction: 20/25 20/25 20/25   With correction:       BP Readings from Last 3 Encounters:  08/15/17 112/72  07/11/17 116/70  05/09/17 117/73   Wt Readings from Last 3 Encounters:  08/15/17 181 lb (82.1 kg)  07/11/17 185 lb (83.9 kg)  05/09/17 188 lb (85.3 kg)   Immunization History  Administered Date(s) Administered  . Td 04/17/2004   Health Maintenance  Topic Date Due  . PNA vac Low Risk Adult (1 of 2 - PCV13) 04/26/2012  . INFLUENZA VACCINE  03/13/2018 (Originally 11/15/2017)  . TETANUS/TDAP  07/12/2018 (Originally 04/17/2014)  . COLONOSCOPY  06/26/2024  . Hepatitis C Screening  Completed   HTN: Patient reports good compliance with medication, good tolerance to medication, and good symptom control.    Hypothyroidism: Patient reports good compliance with medication, good tolerance to medication, and good symptom control.    BPH:  Patient reports good compliance with medication, good tolerance to medication, and good symptom control.   Flomax no longer working. Wanting name brand Flomax.  Urology will not give name brand Flomax.  Anxiety: taking Lorazepam as needed.  Insomnia: taking Ambien as needed.   Iron deficiency anemia: in past.   Review of Systems  Constitutional: Negative for activity change, appetite change, chills, diaphoresis, fatigue, fever and unexpected weight change.  HENT: Negative for congestion, dental problem, drooling, ear discharge, ear pain, facial swelling, hearing loss, mouth sores, nosebleeds,  postnasal drip, rhinorrhea, sinus pressure, sneezing, sore throat, tinnitus, trouble swallowing and voice change.   Eyes: Negative for photophobia, pain, discharge, redness, itching and visual disturbance.  Respiratory: Negative for apnea, cough, choking, chest tightness, shortness of breath, wheezing and stridor.   Cardiovascular: Negative for chest pain, palpitations and leg swelling.  Gastrointestinal: Negative for abdominal pain, blood in stool, constipation, diarrhea, nausea and vomiting.  Endocrine: Negative for cold intolerance, heat intolerance, polydipsia, polyphagia and polyuria.  Genitourinary: Negative for decreased urine volume, difficulty urinating, discharge, dysuria, enuresis, flank pain, frequency, genital sores, hematuria, penile pain, penile swelling, scrotal swelling, testicular pain and urgency.       Nocturia x 3.  Weak stream; able to stand up to urinate.  No sitting to urinate; no straining.  Starts strong and then dribbles.  Musculoskeletal: Negative for arthralgias, back pain, gait problem, joint swelling, myalgias, neck pain and neck stiffness.  Skin: Negative for color change, pallor, rash and wound.  Allergic/Immunologic: Negative for environmental allergies, food allergies and immunocompromised state.  Neurological: Negative for dizziness, tremors, seizures, syncope, facial asymmetry, speech difficulty, weakness, light-headedness, numbness and headaches.  Hematological: Negative for adenopathy. Does not bruise/bleed easily.  Psychiatric/Behavioral: Negative for agitation, behavioral problems, confusion, decreased concentration, dysphoric mood, hallucinations, self-injury, sleep disturbance and suicidal ideas. The patient is not nervous/anxious and is not hyperactive.     Past Medical History:  Diagnosis Date  . Anxiety   . Arthritis   . Degenerative disc disease, lumbar   . Hypertension   . Hypothyroidism    Past Surgical History:  Procedure Laterality Date  .  CHOLECYSTECTOMY    . COLON SURGERY  08/07/2006   colostomy & small bowel resection  . FRACTURE SURGERY     left shoulder  . SPINE SURGERY    . 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 Ciprofloxacin Itching and Rash   Current Outpatient Medications on File Prior to Visit  Medication Sig Dispense Refill  . cholecalciferol (VITAMIN D) 1000 UNITS tablet Take 1,000 Units by mouth daily.    . diclofenac sodium (VOLTAREN) 1 % GEL Apply 4 g topically 4 (four) times daily. 5 Tube 1  . fish oil-omega-3 fatty acids 1000 MG capsule Take 2 g by mouth daily.    Marland Kitchen levothyroxine (SYNTHROID, LEVOTHROID) 125 MCG tablet TAKE 1 TABLET BY MOUTH EVERY DAY BEFORE BREAKFAST 90 tablet 3  . traMADol-acetaminophen (ULTRACET) 37.5-325 MG tablet Take 1 tablet by mouth every 6 (six) hours as needed. 30 tablet 0   No current facility-administered medications on file prior to visit.    Social History   Socioeconomic History  . Marital status: Married    Spouse name: Not on file  . Number of children: Not on file  . Years of education: Not on file  . Highest education level: Not on file  Occupational History  . Not on file  Social Needs  . Financial resource strain: Not on file  . Food insecurity:    Worry: Not on file    Inability: Not on file  . Transportation needs:    Medical: Not on file    Non-medical: Not on file  Tobacco Use  . Smoking status: Never Smoker  . Smokeless tobacco: Never Used  Substance and Sexual Activity  . Alcohol use: Not on file  . Drug use: Not on file  . Sexual activity: Yes  Lifestyle  . Physical activity:    Days per week: Not on file    Minutes per session: Not on file  . Stress: Not on file  Relationships  . Social connections:    Talks on phone: Not on file    Gets together: Not on file    Attends religious service: Not on file    Active member of club or organization: Not on file    Attends meetings of clubs or  organizations: Not on file    Relationship status: Not on file  . Intimate partner violence:    Fear of current or ex partner: Not on file    Emotionally abused: Not on file    Physically abused: Not on file    Forced sexual activity: Not on file  Other Topics Concern  . Not on file  Social History Narrative   Marital status: married x 49 years      Children: 2 children/sons; 2 grandchildren      Lives: with wife      Employment: retired; stays busy      Tobacco: none      Alcohol: 3 alcoholic drinks on weekends      Exercise: stays very active; was swimming but stopped in 2017.  Walking.      Advanced Directives: YES; FULL CODE; no prolonged measures.   Family History  Problem Relation Age of Onset  . Heart disease Mother        CAD and CHF       Objective:    BP 116/70   Temp 86 F (36.7 C) (Oral)   Resp 16   Ht 5' 7.32" (1.71 m)  Wt 185 lb (83.9 kg)   SpO2 98%   BMI 28.70 kg/m  Physical Exam  Constitutional: He is oriented to person, place, and time. He appears well-developed and well-nourished. No distress.  HENT:  Head: Normocephalic and atraumatic.  Right Ear: External ear normal.  Left Ear: External ear normal.  Nose: Nose normal.  Mouth/Throat: Oropharynx is clear and moist.  Eyes: Pupils are equal, round, and reactive to light. Conjunctivae and EOM are normal.  Neck: Normal range of motion. Neck supple. Carotid bruit is not present. No thyromegaly present.  Cardiovascular: Normal rate, regular rhythm, normal heart sounds and intact distal pulses. Exam reveals no gallop and no friction rub.  No murmur heard. Pulmonary/Chest: Effort normal and breath sounds normal. He has no wheezes. He has no rales.  Abdominal: Soft. Bowel sounds are normal. He exhibits no distension and no mass. There is no tenderness. There is no rebound and no guarding.  Genitourinary: Penis normal.  Musculoskeletal:       Right shoulder: Normal.       Left shoulder: Normal.        Cervical back: Normal.  Lymphadenopathy:    He has no cervical adenopathy.  Neurological: He is alert and oriented to person, place, and time. He has normal reflexes. No cranial nerve deficit. He exhibits normal muscle tone. Coordination normal.  Skin: Skin is warm and dry. No rash noted. He is not diaphoretic.  Psychiatric: He has a normal mood and affect. His behavior is normal. Judgment and thought content normal.   No results found. Depression screen [facility] 2/9 07/11/2017 01/17/2017 01/17/2017 08/15/2016 03/08/2015  Decreased Interest 0 0 0 0 0  Down, Depressed, Hopeless 0 0 0 1 0  PHQ - 2 Score 0 0 0 1 0  Altered sleeping - - - - -  Tired, decreased energy - - - - -  Change in appetite - - - - -  Feeling bad or failure about yourself  - - - - -  Trouble concentrating - - - - -  Moving slowly or fidgety/restless - - - - -  Suicidal thoughts - - - - -  PHQ-9 Score - - - - -  Difficult doing work/chores - - - - -   Fall Risk  07/11/2017 01/17/2017 01/17/2017 08/15/2016 03/08/2015  Falls in the past year? No No No No No    Functional Status Survey: Is the patient deaf or have difficulty hearing?: No Does the patient have difficulty seeing, even when wearing glasses/contacts?: No Does the patient have difficulty concentrating, remembering, or making decisions?: No Does the patient have difficulty walking or climbing stairs?: No Does the patient have difficulty dressing or bathing?: No Does the patient have difficulty doing errands alone such as visiting a doctor's office or shopping?: No     Assessment & Plan:   1. Medicare annual wellness visit, subsequent   2. Routine physical examination   3. Essential hypertension   4. Hypothyroidism due to acquired atrophy of thyroid   5. Anxiety state   6. Chronic pain syndrome   7. Psychophysiological insomnia   8. Iron deficiency anemia due to chronic blood loss   9. Irritable bowel syndrome with diarrhea   10. Pure hypercholesterolemia     11. Encounter for screening for malignant neoplasm of prostate   12. BMI 28.0-28.9,adult     -anticipatory guidance provided --- exercise, weight loss, safe driving practices, aspirin 81mg  daily. -obtain age appropriate screening labs and labs for chronic  disease management. -moderate fall risk; no evidence of depression; no evidence of hearing loss.  Discussed advanced directives and living will; also discussed end of life issues including code status.   Hypertension, hypothyroidism, anxiety, chronic pain syndrome, insomnia, iron deficiency anemia, IBS, hypercholesterolemia:stable and well-controlled.  Refills provided without adjustments.  Polypharmacy:  Patient maintained on multiple high-risk medications however using them as needed and sparingly.  Advised patient of risk of using high risk medications together.  Patient expressed understanding.  Overweight:  BMI 28.  Recommend weight loss, exercise, low-fat and low calorie diet.  Recommend limiting calories to 800 kcal/day.  Also recommend a goal of 60 g of protein per day.  Recommend exercise 30 minutes/day 5 days/week.   Orders Placed This Encounter  Procedures  . CBC with Differential/Platelet  . Comprehensive metabolic panel    Order Specific Question:   Has the patient fasted?    Answer:   No  . Lipid panel    Order Specific Question:   Has the patient fasted?    Answer:   No  . PSA  . TSH  . Iron  . T4, free  . POCT urinalysis dipstick  . EKG 12-Lead   Meds ordered this encounter  Medications  . benazepril-hydrochlorthiazide (LOTENSIN HCT) 20-25 MG tablet    Sig: Take 1 tablet by mouth daily.    Dispense:  90 tablet    Refill:  1  . FLOMAX 0.4 MG CAPS capsule    Sig: Take 1 capsule (0.4 mg total) by mouth daily after breakfast.    Dispense:  90 capsule    Refill:  3  . HYDROcodone-acetaminophen (NORCO/VICODIN) 5-325 MG tablet    Sig: Take 1-2 tablets by mouth every 6 (six) hours as needed for moderate pain.     Dispense:  40 tablet    Refill:  0  . LORazepam (ATIVAN) 1 MG tablet    Sig: TAKE 1 TABLET BY MOUTH EVERY DAY AS NEEDED FOR ANXIETY    Dispense:  30 tablet    Refill:  3    Not to exceed 4 additional fills before 05/01/2016.  . zolpidem (AMBIEN) 10 MG tablet    Sig: Take 1 tablet (10 mg total) by mouth at bedtime.    Dispense:  30 tablet    Refill:  5    Return in about 6 months (around 01/11/2018) for follow-up chronic medical conditions, AWV.   Izrael Peak Elayne Guerin, M.D. [facility] [address] [place], [place]  [address] [phone] phone [phone] fax

## 2017-07-12 LAB — CBC WITH DIFFERENTIAL/PLATELET
Basophils Absolute: 0 10*3/uL (ref 0.0–0.2)
Basos: 0 %
EOS (ABSOLUTE): 0.1 10*3/uL (ref 0.0–0.4)
Eos: 2 %
Hematocrit: 46 % (ref 37.5–51.0)
Hemoglobin: 15.1 g/dL (ref 13.0–17.7)
Immature Grans (Abs): 0 10*3/uL (ref 0.0–0.1)
Immature Granulocytes: 0 %
Lymphocytes Absolute: 1.8 10*3/uL (ref 0.7–3.1)
Lymphs: 24 %
MCH: 26.9 pg (ref 26.6–33.0)
MCHC: 32.8 g/dL (ref 31.5–35.7)
MCV: 82 fL (ref 79–97)
Monocytes Absolute: 0.8 10*3/uL (ref 0.1–0.9)
Monocytes: 11 %
Neutrophils Absolute: 4.7 10*3/uL (ref 1.4–7.0)
Neutrophils: 63 %
Platelets: 369 10*3/uL (ref 150–379)
RBC: 5.62 x10E6/uL (ref 4.14–5.80)
RDW: 15 % (ref 12.3–15.4)
WBC: 7.4 10*3/uL (ref 3.4–10.8)

## 2017-07-12 LAB — COMPREHENSIVE METABOLIC PANEL
ALT: 16 IU/L (ref 0–44)
AST: 16 IU/L (ref 0–40)
Albumin/Globulin Ratio: 1.7 (ref 1.2–2.2)
Albumin: 4.5 g/dL (ref 3.5–4.8)
Alkaline Phosphatase: 75 IU/L (ref 39–117)
BUN/Creatinine Ratio: 12 (ref 10–24)
BUN: 14 mg/dL (ref 8–27)
Bilirubin Total: 1.1 mg/dL (ref 0.0–1.2)
CO2: 22 mmol/L (ref 20–29)
Calcium: 9.9 mg/dL (ref 8.6–10.2)
Chloride: 100 mmol/L (ref 96–106)
Creatinine, Ser: 1.15 mg/dL (ref 0.76–1.27)
GFR calc Af Amer: 74 mL/min/{1.73_m2} (ref >59)
GFR calc non Af Amer: 64 mL/min/{1.73_m2} (ref >59)
Globulin, Total: 2.6 g/dL (ref 1.5–4.5)
Glucose: 99 mg/dL (ref 65–99)
Potassium: 4 mmol/L (ref 3.5–5.2)
Sodium: 137 mmol/L (ref 134–144)
Total Protein: 7.1 g/dL (ref 6.0–8.5)

## 2017-07-12 LAB — T4, FREE: Free T4: 1.63 ng/dL (ref 0.82–1.77)

## 2017-07-12 LAB — PSA: Prostate Specific Ag, Serum: 2.1 ng/mL (ref 0.0–4.0)

## 2017-07-12 LAB — LIPID PANEL
Chol/HDL Ratio: 3.8 ratio (ref 0.0–5.0)
Cholesterol, Total: 152 mg/dL (ref 100–199)
HDL: 40 mg/dL (ref >39)
LDL Calculated: 100 mg/dL — ABNORMAL HIGH (ref 0–99)
Triglycerides: 62 mg/dL (ref 0–149)
VLDL Cholesterol Cal: 12 mg/dL (ref 5–40)

## 2017-07-12 LAB — IRON: Iron: 55 ug/dL (ref 38–169)

## 2017-07-12 LAB — TSH: TSH: 1.89 u[IU]/mL (ref 0.450–4.500)

## 2017-08-05 ENCOUNTER — Other Ambulatory Visit (INDEPENDENT_AMBULATORY_CARE_PROVIDER_SITE_OTHER): Payer: Self-pay | Admitting: Specialist

## 2017-08-06 NOTE — Telephone Encounter (Signed)
Diclofenac refill request 

## 2017-08-15 ENCOUNTER — Ambulatory Visit (INDEPENDENT_AMBULATORY_CARE_PROVIDER_SITE_OTHER): Payer: Medicare HMO | Admitting: Family Medicine

## 2017-08-15 ENCOUNTER — Encounter: Payer: Self-pay | Admitting: Family Medicine

## 2017-08-15 ENCOUNTER — Other Ambulatory Visit: Payer: Self-pay

## 2017-08-15 VITALS — BP 112/72 | HR 83 | Temp 98.0°F | Resp 16 | Ht 67.0 in | Wt 181.0 lb

## 2017-08-15 DIAGNOSIS — J029 Acute pharyngitis, unspecified: Secondary | ICD-10-CM

## 2017-08-15 DIAGNOSIS — W57XXXA Bitten or stung by nonvenomous insect and other nonvenomous arthropods, initial encounter: Secondary | ICD-10-CM | POA: Diagnosis not present

## 2017-08-15 DIAGNOSIS — S40862A Insect bite (nonvenomous) of left upper arm, initial encounter: Secondary | ICD-10-CM

## 2017-08-15 MED ORDER — DOXYCYCLINE HYCLATE 100 MG PO TABS: 100.0000 mg | ORAL_TABLET | Freq: Two times a day (BID) | ORAL | 0 refills | Status: DC

## 2017-08-15 NOTE — Patient Instructions (Addendum)
   IF you received an x-ray today, you will receive an invoice from [facility]. Please contact [facility] at [phone] with questions or concerns regarding your invoice.   IF you received labwork today, you will receive an invoice from [facility]. Please contact [facility] at [phone] with questions or concerns regarding your invoice.   Our billing staff will not be able to assist you with questions regarding bills from these companies.  You will be contacted with the lab results as soon as they are available. The fastest way to get your results is to activate your My Chart account. Instructions are located on the last page of this paperwork. If you have not heard from us regarding the results in 2 weeks, please contact this office.     Tick Bite Information, Adult Ticks are insects that can bite. Most ticks live in shrubs and grassy areas. They climb onto people and animals that go by. Then they bite. Some ticks carry germs that can make you sick. How can I prevent tick bites?  Use an insect repellent that has 20% or higher of the ingredients DEET, picaridin, or IR3535. Put this insect repellent on: ? Bare skin. ? The tops of your boots. ? Your pant legs. ? The ends of your sleeves.  If you use an insect repellent that has the ingredient permethrin, make sure to follow the instructions on the bottle. Treat the following: ? Clothing. ? Supplies. ? Boots. ? Tents.  Wear long sleeves, long pants, and light colors.  Tuck your pant legs into your socks.  Stay in the middle of the trail.  Try not to walk through long grass.  Before going inside your house, check your clothes, hair, and skin for ticks. Make sure to check your head, neck, armpits, waist, groin, and joint areas.  Check for ticks every day.  When you come indoors: ? Wash your clothes right away. ? Shower right away. ? Dry your clothes in a dryer on high heat for 60 minutes or more. What is  the right way to remove a tick? Remove a tick from your skin as soon as possible.  To remove a tick that is crawling on your skin: ? Go outdoors and brush the tick off. ? Use tape or a lint roller.  To remove a tick that is biting: ? Wash your hands. ? If you have latex gloves, put them on. ? Use tweezers, curved forceps, or a tick-removal tool to grasp the tick. Grasp the tick as close to your skin and as close to the tick's head as possible. ? Gently pull up until the tick lets go.  Try to keep the tick's head attached to its body.  Do not twist or jerk the tick.  Do not squeeze or crush the tick.  Do not try to remove a tick with heat, alcohol, petroleum jelly, or fingernail polish. How should I get rid of a tick? Here are some ways to get rid of a tick that is alive:  Place the tick in rubbing alcohol.  Place the tick in a bag or container you can close tightly.  Wrap the tick tightly in tape.  Flush the tick down the toilet.  Contact a doctor if:  You have symptoms of a disease, such as: ? Pain in a muscle, joint, or bone. ? Trouble walking or moving your legs. ? Numbness in your legs. ? Inability to move (paralysis). ? A red rash that makes a circle (  bull's-eye rash). ? Redness and swelling where the tick bit you. ? A fever. ? Throwing up (vomiting) over and over. ? Diarrhea. ? Weight loss. ? Tender and swollen lymph glands. ? Shortness of breath. ? Cough. ? Belly pain (abdominal pain). ? Headache. ? Being more tired than normal. ? A change in how alert (conscious) you are. ? Confusion. Get help right away if:  You cannot remove a tick.  A part of a tick breaks off and gets stuck in your skin.  You are feeling worse. Summary  Ticks may carry germs that can make you sick.  To prevent tick bites, wear long sleeves, long pants, and light colors. Use insect repellent. Follow the instructions on the bottle.  If the tick is biting, do not try to remove  it with heat, alcohol, petroleum jelly, or fingernail polish.  Use tweezers, curved forceps, or a tick-removal tool to grasp the tick. Gently pull up until the tick lets go. Do not twist or jerk the tick. Do not squeeze or crush the tick.  If you have symptoms, contact a doctor. This information is not intended to replace advice given to you by your health care provider. Make sure you discuss any questions you have with your health care provider. Document Released: 06/28/2009 Document Revised: 07/14/2016 Document Reviewed: 07/14/2016 [employer] Interactive Patient Education  2018 [employer]  

## 2017-08-15 NOTE — Progress Notes (Signed)
Subjective:    Patient ID: Henry [name], male    DOB: March 31, 1946, 72 y.o.   MRN: [id number]  08/15/2017  Tick Removal (pt states he noticed he had a tick bite on Monday and removed the tick )    HPI This 72 y.o. male presents for evaluation of tick bite.   Two days ago, pulled off tick of L axilla.  Tick with white dot on it. Local reaction around tick. Able to move tick intact. Researched on internet, having diarrhea, drowsiness, fever.   No fever.  Took antihistamine but made really drowsy. On internet, may feel full effect. Going on vacation. 20% improved.  Looks better.   Applying hydrocortisone and neosporin. Also suffering with sore throat.  BP Readings from Last 3 Encounters:  08/15/17 112/72  07/11/17 116/70  05/09/17 117/73   Wt Readings from Last 3 Encounters:  08/15/17 181 lb (82.1 kg)  07/11/17 185 lb (83.9 kg)  05/09/17 188 lb (85.3 kg)   Immunization History  Administered Date(s) Administered  . Td 04/17/2004    Review of Systems  Constitutional: Positive for fatigue and fever. Negative for chills and diaphoresis.  HENT: Positive for sore throat. Negative for congestion, ear discharge, ear pain, rhinorrhea, sinus pressure and sinus pain.   Gastrointestinal: Positive for diarrhea. Negative for abdominal pain, nausea and vomiting.  Skin: Positive for color change, rash and wound. Negative for pallor.    Past Medical History:  Diagnosis Date  . Anxiety   . Arthritis   . Degenerative disc disease, lumbar   . Hypertension   . Hypothyroidism    Past Surgical History:  Procedure Laterality Date  . CHOLECYSTECTOMY    . COLON SURGERY  08/07/2006   colostomy & small bowel resection  . FRACTURE SURGERY     left shoulder  . SPINE SURGERY    . 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 Ciprofloxacin Itching and Rash   Current Outpatient Medications on File Prior to Visit  Medication Sig Dispense Refill  .  benazepril-hydrochlorthiazide (LOTENSIN HCT) 20-25 MG tablet Take 1 tablet by mouth daily. 90 tablet 1  . cholecalciferol (VITAMIN D) 1000 UNITS tablet Take 1,000 Units by mouth daily.    . diclofenac (VOLTAREN) 75 MG EC tablet TAKE 1 TABLET BY MOUTH TWICE A DAY 60 tablet 2  . diclofenac sodium (VOLTAREN) 1 % GEL Apply 4 g topically 4 (four) times daily. 5 Tube 1  . fish oil-omega-3 fatty acids 1000 MG capsule Take 2 g by mouth daily.    Marland Kitchen FLOMAX 0.4 MG CAPS capsule Take 1 capsule (0.4 mg total) by mouth daily after breakfast. 90 capsule 3  . HYDROcodone-acetaminophen (NORCO/VICODIN) 5-325 MG tablet Take 1-2 tablets by mouth every 6 (six) hours as needed for moderate pain. 40 tablet 0  . levothyroxine (SYNTHROID, LEVOTHROID) 125 MCG tablet TAKE 1 TABLET BY MOUTH EVERY DAY BEFORE BREAKFAST 90 tablet 3  . LORazepam (ATIVAN) 1 MG tablet TAKE 1 TABLET BY MOUTH EVERY DAY AS NEEDED FOR ANXIETY 30 tablet 3  . traMADol-acetaminophen (ULTRACET) 37.5-325 MG tablet Take 1 tablet by mouth every 6 (six) hours as needed. 30 tablet 0  . zolpidem (AMBIEN) 10 MG tablet Take 1 tablet (10 mg total) by mouth at bedtime. 30 tablet 5   No current facility-administered medications on file prior to visit.    Social History   Socioeconomic History  . Marital status: Married    Spouse name: Not on  file  . Number of children: Not on file  . Years of education: Not on file  . Highest education level: Not on file  Occupational History  . Not on file  Social Needs  . Financial resource strain: Not on file  . Food insecurity:    Worry: Not on file    Inability: Not on file  . Transportation needs:    Medical: Not on file    Non-medical: Not on file  Tobacco Use  . Smoking status: Never Smoker  . Smokeless tobacco: Never Used  Substance and Sexual Activity  . Alcohol use: Not on file  . Drug use: Not on file  . Sexual activity: Yes  Lifestyle  . Physical activity:    Days per week: Not on file    Minutes  per session: Not on file  . Stress: Not on file  Relationships  . Social connections:    Talks on phone: Not on file    Gets together: Not on file    Attends religious service: Not on file    Active member of club or organization: Not on file    Attends meetings of clubs or organizations: Not on file    Relationship status: Not on file  . Intimate partner violence:    Fear of current or ex partner: Not on file    Emotionally abused: Not on file    Physically abused: Not on file    Forced sexual activity: Not on file  Other Topics Concern  . Not on file  Social History Narrative   Marital status: married x 49 years      Children: 2 children/sons; 2 grandchildren      Lives: with wife      Employment: retired; stays busy      Tobacco: none      Alcohol: 3 alcoholic drinks on weekends      Exercise: stays very active; was swimming but stopped in 2017.  Walking.      Advanced Directives: YES; FULL CODE; no prolonged measures.   Family History  Problem Relation Age of Onset  . Heart disease Mother        CAD and CHF       Objective:    BP 112/72   Pulse 83   Temp 98 F (36.7 C) (Oral)   Resp 16   Ht 5\' 7"  (1.702 m)   Wt 181 lb (82.1 kg)   SpO2 98%   BMI 28.35 kg/m  Physical Exam  Constitutional: He is oriented to person, place, and time. He appears well-developed and well-nourished. No distress.  HENT:  Head: Normocephalic and atraumatic.  Right Ear: Hearing, tympanic membrane, external ear and ear canal normal. Tympanic membrane is not erythematous.  Left Ear: Hearing, tympanic membrane, external ear and ear canal normal. Tympanic membrane is not erythematous.  Mouth/Throat: Uvula is midline and mucous membranes are normal. Posterior oropharyngeal erythema present. No oropharyngeal exudate, posterior oropharyngeal edema or tonsillar abscesses.  Eyes: Pupils are equal, round, and reactive to light. Conjunctivae and EOM are normal.  Neck: Normal range of motion. Neck  supple. Carotid bruit is not present. No thyromegaly present.  Cardiovascular: Normal rate, regular rhythm, normal heart sounds and intact distal pulses. Exam reveals no gallop and no friction rub.  No murmur heard. Pulmonary/Chest: Effort normal and breath sounds normal. He has no wheezes. He has no rales.  Lymphadenopathy:    He has no cervical adenopathy.  Neurological: He is alert and  oriented to person, place, and time. No cranial nerve deficit.  Skin: Skin is warm and dry. No rash noted. He is not diaphoretic. There is erythema.  Insect bite inferior to right axilla region.  Surrounding erythema 2 cm in diameter.  No streaking.  No fluctuance.  No active drainage.  Psychiatric: He has a normal mood and affect. His behavior is normal.  Nursing note and vitals reviewed.  No results found. Depression screen [facility] 2/9 07/11/2017 01/17/2017 01/17/2017 08/15/2016 03/08/2015  Decreased Interest 0 0 0 0 0  Down, Depressed, Hopeless 0 0 0 1 0  PHQ - 2 Score 0 0 0 1 0  Altered sleeping - - - - -  Tired, decreased energy - - - - -  Change in appetite - - - - -  Feeling bad or failure about yourself  - - - - -  Trouble concentrating - - - - -  Moving slowly or fidgety/restless - - - - -  Suicidal thoughts - - - - -  PHQ-9 Score - - - - -  Difficult doing work/chores - - - - -   Fall Risk  07/11/2017 01/17/2017 01/17/2017 08/15/2016 03/08/2015  Falls in the past year? No No No No No        Assessment & Plan:   1. Tick bite of axillary region, left, initial encounter   2. Sore throat     Tick bite: New onset.  With secondary infection surrounding bite.  Treat with doxycycline.    No suggestion of [place] spotted fever at this time; however, return to clinic immediately for acute decline. Sore throat: New onset.  Obtain throat culture.  Supportive care with Tylenol or ibuprofen.  Return to clinic in ability to swallow.  Orders Placed This Encounter  Procedures  . Culture, Group A Strep     Order Specific Question:   Source    Answer:   oropharynx  . POCT rapid strep A   Meds ordered this encounter  Medications  . doxycycline (VIBRA-TABS) 100 MG tablet    Sig: Take 1 tablet (100 mg total) by mouth 2 (two) times daily.    Dispense:  20 tablet    Refill:  0    No follow-ups on file.   Martine Bleecker Elayne Guerin, M.D. Primary Care at [facility] [place] [address] [place], [place]  [address] [phone] phone [phone] fax

## 2017-08-17 LAB — CULTURE, GROUP A STREP: Strep A Culture: NEGATIVE

## 2017-09-11 ENCOUNTER — Encounter: Payer: Self-pay | Admitting: Family Medicine

## 2017-11-10 ENCOUNTER — Other Ambulatory Visit (INDEPENDENT_AMBULATORY_CARE_PROVIDER_SITE_OTHER): Payer: Self-pay | Admitting: Specialist

## 2017-11-12 NOTE — Telephone Encounter (Signed)
Please advise 

## 2017-11-13 ENCOUNTER — Telehealth: Payer: Self-pay | Admitting: Physician Assistant

## 2017-11-21 ENCOUNTER — Encounter: Payer: Self-pay | Admitting: Family Medicine

## 2018-01-08 ENCOUNTER — Telehealth: Payer: Self-pay | Admitting: Emergency Medicine

## 2018-01-08 NOTE — Telephone Encounter (Signed)
Copied from [place] [id number]. Topic: Quick Communication - Rx Refill/Question >> Jan 08, 2018 11:48 AM Oliver Pila B wrote: Medication: FLOMAX 0.4 MG CAPS capsule [[id number]]   Preferred Pharmacy (with phone number [facility] street [facility]): [facility]  Medication: zolpidem (AMBIEN) 10 MG tablet [[id number]]   Pharmacy: [facility]  Agent: Please be advised that RX refills may take up to 3 business days. We ask that you follow-up with your pharmacy.

## 2018-01-08 NOTE — Telephone Encounter (Signed)
Left message for pt to return call to the office to verify if the pt want to use [facility] or [facility] for refills. [facility]ill of Flomax 0.4mg  [facility] and if pt would like to use [facility] the pt would need to call the pharmacy and have prescription transferred. Pres[facility]ax [facility] with 3 refills  Ambien 10mg  refill Last Refill:07/11/17 #30 by Dr. Tamala Julian Last OV: 07/11/17 Next OV: 01/23/18 with Dr.Sagardia PCP: Dr. Mitchel Honour

## 2018-01-09 ENCOUNTER — Other Ambulatory Visit: Payer: Self-pay | Admitting: Emergency Medicine

## 2018-01-09 MED ORDER — TAMSULOSIN HCL 0.4 MG PO CAPS: 0.4000 mg | ORAL_CAPSULE | Freq: Every day | ORAL | 3 refills | Status: DC

## 2018-01-09 MED ORDER — FLOMAX 0.4 MG PO CAPS: 0.4000 mg | ORAL_CAPSULE | Freq: Every day | ORAL | 3 refills | Status: DC

## 2018-01-09 NOTE — Telephone Encounter (Signed)
Flomax refill sent

## 2018-01-09 NOTE — Telephone Encounter (Signed)
Resent

## 2018-01-09 NOTE — Addendum Note (Signed)
Addended by: Davina Poke on: 01/09/2018 09:35 AM   Modules accepted: Orders

## 2018-01-09 NOTE — Telephone Encounter (Signed)
Patient wife called back to check on refill request. She was informed that it could take up to 3 days. She asked that RX are sent to. [facility] address [id number] [address] [place], [place] [phone] (Phone) [phone] (Fax)

## 2018-01-09 NOTE — Telephone Encounter (Signed)
Called pt's pharmacy and pharmacist stated pt's insurance will not cover med if written as dispense as written. Medication is tamsulosin (Flomax). Please advise.

## 2018-01-09 NOTE — Addendum Note (Signed)
Addended by: Davina Poke on: 01/09/2018 02:15 PM   Modules accepted: Orders

## 2018-01-16 ENCOUNTER — Ambulatory Visit: Payer: Medicare HMO | Admitting: Family Medicine

## 2018-01-16 ENCOUNTER — Ambulatory Visit: Payer: Medicare HMO | Admitting: Physician Assistant

## 2018-01-22 ENCOUNTER — Ambulatory Visit: Payer: Medicare HMO | Admitting: Emergency Medicine

## 2018-01-23 ENCOUNTER — Other Ambulatory Visit: Payer: Self-pay

## 2018-01-23 ENCOUNTER — Encounter: Payer: Self-pay | Admitting: Emergency Medicine

## 2018-01-23 ENCOUNTER — Ambulatory Visit (INDEPENDENT_AMBULATORY_CARE_PROVIDER_SITE_OTHER): Payer: Medicare HMO | Admitting: Emergency Medicine

## 2018-01-23 VITALS — BP 116/75 | HR 68 | Temp 97.7°F | Resp 16 | Ht 65.0 in | Wt 181.2 lb

## 2018-01-23 DIAGNOSIS — R399 Unspecified symptoms and signs involving the genitourinary system: Secondary | ICD-10-CM | POA: Diagnosis not present

## 2018-01-23 DIAGNOSIS — I10 Essential (primary) hypertension: Secondary | ICD-10-CM

## 2018-01-23 DIAGNOSIS — F5104 Psychophysiologic insomnia: Secondary | ICD-10-CM

## 2018-01-23 DIAGNOSIS — R69 Illness, unspecified: Secondary | ICD-10-CM | POA: Diagnosis not present

## 2018-01-23 DIAGNOSIS — G894 Chronic pain syndrome: Secondary | ICD-10-CM | POA: Diagnosis not present

## 2018-01-23 DIAGNOSIS — F411 Generalized anxiety disorder: Secondary | ICD-10-CM

## 2018-01-23 DIAGNOSIS — E034 Atrophy of thyroid (acquired): Secondary | ICD-10-CM

## 2018-01-23 MED ORDER — BENAZEPRIL-HYDROCHLOROTHIAZIDE 20-25 MG PO TABS: 1.0000 | ORAL_TABLET | Freq: Every day | ORAL | 1 refills | Status: DC

## 2018-01-23 MED ORDER — LORAZEPAM 1 MG PO TABS: ORAL_TABLET | ORAL | 1 refills | Status: DC

## 2018-01-23 MED ORDER — TAMSULOSIN HCL 0.4 MG PO CAPS: 0.4000 mg | ORAL_CAPSULE | Freq: Every day | ORAL | 3 refills | Status: DC

## 2018-01-23 MED ORDER — ZOLPIDEM TARTRATE 10 MG PO TABS: 10.0000 mg | ORAL_TABLET | Freq: Every day | ORAL | 5 refills | Status: DC

## 2018-01-23 MED ORDER — HYDROCODONE-ACETAMINOPHEN 5-325 MG PO TABS: 1.0000 | ORAL_TABLET | Freq: Three times a day (TID) | ORAL | 0 refills | Status: DC | PRN

## 2018-01-23 MED ORDER — LEVOTHYROXINE SODIUM 125 MCG PO TABS: ORAL_TABLET | ORAL | 3 refills | Status: DC

## 2018-01-23 NOTE — Patient Instructions (Addendum)
   If you have lab work done today you will be contacted with your lab results within the next 2 weeks.  If you have not heard from us then please contact us. The fastest way to get your results is to register for My Chart.   IF you received an x-ray today, you will receive an invoice from [facility]. Please contact [facility] at [phone] with questions or concerns regarding your invoice.   IF you received labwork today, you will receive an invoice from [facility]. Please contact [facility] at [phone] with questions or concerns regarding your invoice.   Our billing staff will not be able to assist you with questions regarding bills from these companies.  You will be contacted with the lab results as soon as they are available. The fastest way to get your results is to activate your My Chart account. Instructions are located on the last page of this paperwork. If you have not heard from us regarding the results in 2 weeks, please contact this office.     Chronic Pain, Adult Chronic pain is a type of pain that lasts or keeps coming back (recurs) for at least six months. You may have chronic headaches, abdominal pain, or body pain. Chronic pain may be related to an illness, such as fibromyalgia or complex regional pain syndrome. Sometimes the cause of chronic pain is not known. Chronic pain can make it hard for you to do daily activities. If not treated, chronic pain can lead to other health problems, including anxiety and depression. Treatment depends on the cause and severity of your pain. You may need to work with a pain specialist to come up with a treatment plan. The plan may include medicine, counseling, and physical therapy. Many people benefit from a combination of two or more types of treatment to control their pain. Follow these instructions at home: Lifestyle  Consider keeping a pain diary to share with your health care providers.  Consider talking with a  mental health care provider (psychologist) about how to cope with chronic pain.  Consider joining a chronic pain support group.  Try to control or lower your stress levels. Talk to your health care provider about strategies to do this. General instructions   Take over-the-counter and prescription medicines only as told by your health care provider.  Follow your treatment plan as told by your health care provider. This may include: ? Gentle, regular exercise. ? Eating a healthy diet that includes foods such as vegetables, fruits, fish, and lean meats. ? Cognitive or behavioral therapy. ? Working with a physical therapist. ? Meditation or yoga. ? Acupuncture or massage therapy. ? Aroma, color, light, or sound therapy. ? Local electrical stimulation. ? Shots (injections) of numbing or pain-relieving medicines into the spine or the area of pain.  Check your pain level as told by your health care provider. Ask your health care provider if you should use a pain scale.  Learn as much as you can about how to manage your chronic pain. Ask your health care provider if an intensive pain rehabilitation program or a chronic pain specialist would be helpful.  Keep all follow-up visits as told by your health care provider. This is important. Contact a health care provider if:  Your pain gets worse.  You have new pain.  You have trouble sleeping.  You have trouble doing your normal activities.  Your pain is not controlled with treatment.  Your have side effects from pain medicine.    You feel weak. Get help right away if:  You lose feeling or have numbness in your body.  You lose control of bowel or bladder function.  Your pain suddenly gets much worse.  You develop shaking or chills.  You develop confusion.  You develop chest pain.  You have trouble breathing or shortness of breath.  You pass out.  You have thoughts about hurting yourself or others. This information is not  intended to replace advice given to you by your health care provider. Make sure you discuss any questions you have with your health care provider. Document Released: 12/24/2001 Document Revised: 12/02/2015 Document Reviewed: 09/21/2015 Elsevier Interactive Patient Education  2018 [employer]  

## 2018-01-23 NOTE — Progress Notes (Signed)
[place] Haven 72 y.o.   Chief Complaint  Patient presents with  . Medication Refill    ALL including tramadol-aceta, hydrocodone-aceta, also Shringrix    HISTORY OF PRESENT ILLNESS: This is a 72 y.o. male with history of hypertension chronic pain syndrome secondary to back and shoulder chronic issues on several medications.  Here to establish care with me, transferring care from Dr. Tamala Julian, and medication refills.  Requesting refills on hydrocodone, lorazepam, Lotensin, Flomax, and Ambien.  First visit with me.  HPI   Prior to Admission medications   Medication Sig Start Date End Date Taking? Authorizing Provider  benazepril-hydrochlorthiazide (LOTENSIN HCT) 20-25 MG tablet Take 1 tablet by mouth daily. 07/11/17  Yes Wardell Honour, MD  cholecalciferol (VITAMIN D) 1000 UNITS tablet Take 1,000 Units by mouth daily.   Yes [provider]  diclofenac (VOLTAREN) 75 MG EC tablet TAKE 1 TABLET BY MOUTH TWICE A DAY 11/12/17  Yes Jessy Oto, MD  diclofenac sodium (VOLTAREN) 1 % GEL Apply 4 g topically 4 (four) times daily. 04/06/17  Yes Jessy Oto, MD  fish oil-omega-3 fatty acids 1000 MG capsule Take 2 g by mouth daily.   Yes [provider]  HYDROcodone-acetaminophen (NORCO/VICODIN) 5-325 MG tablet Take 1-2 tablets by mouth every 6 (six) hours as needed for moderate pain. 07/11/17  Yes Wardell Honour, MD  levothyroxine (SYNTHROID, LEVOTHROID) 125 MCG tablet TAKE 1 TABLET BY MOUTH EVERY DAY BEFORE BREAKFAST 01/17/17  Yes Wardell Honour, MD  LORazepam (ATIVAN) 1 MG tablet TAKE 1 TABLET BY MOUTH EVERY DAY AS NEEDED FOR ANXIETY 07/11/17  Yes Wardell Honour, MD  tamsulosin [facility]) 0.4 MG CAPS capsule Take 1 capsule (0.4 mg total) by mouth daily after breakfast. 01/09/18  Yes Lesbia Ottaway, Ines Bloomer, MD  traMADol-acetaminophen (ULTRACET) 37.5-325 MG tablet Take 1 tablet by mouth every 6 (six) hours as needed. 04/04/17  Yes Jessy Oto, MD  zolpidem (AMBIEN) 10 MG tablet  Take 1 tablet (10 mg total) by mouth at bedtime. 07/11/17  Yes Wardell Honour, MD    Allergies  Allergen Reactions  . Ciprofloxacin Itching and Rash    Patient Active Problem List   Diagnosis Date Noted  . Irritable bowel syndrome with diarrhea 09/07/2016  . Iron deficiency anemia due to chronic blood loss 09/07/2016  . Hypothyroidism due to acquired atrophy of thyroid 08/15/2016  . Degenerative disc disease, lumbar 03/16/2014  . Insomnia 03/16/2014  . Anxiety state 03/16/2014  . Chronic pain syndrome 03/16/2014  . ARTERIOVENOUS MALFORMATION 07/16/2009  . Essential hypertension 07/19/2007  . DIVERTICULITIS, HX OF 07/19/2007    Past Medical History:  Diagnosis Date  . Anxiety   . Arthritis   . Degenerative disc disease, lumbar   . Hypertension   . Hypothyroidism     Past Surgical History:  Procedure Laterality Date  . CHOLECYSTECTOMY    . COLON SURGERY  08/07/2006   colostomy & small bowel resection  . FRACTURE SURGERY     left shoulder  . SPINE SURGERY    . 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    Social History   Socioeconomic History  . Marital status: Married    Spouse name: Not on file  . Number of children: Not on file  . Years of education: Not on file  . Highest education level: Not on file  Occupational History  . Not on file  Social Needs  . Financial resource strain: Not on file  . Food insecurity:  Worry: Not on file    Inability: Not on file  . Transportation needs:    Medical: Not on file    Non-medical: Not on file  Tobacco Use  . Smoking status: Never Smoker  . Smokeless tobacco: Never Used  Substance and Sexual Activity  . Alcohol use: Not on file  . Drug use: Not on file  . Sexual activity: Yes  Lifestyle  . Physical activity:    Days per week: Not on file    Minutes per session: Not on file  . Stress: Not on file  Relationships  . Social connections:    Talks on phone: Not on file    Gets together: Not on file     Attends religious service: Not on file    Active member of club or organization: Not on file    Attends meetings of clubs or organizations: Not on file    Relationship status: Not on file  . Intimate partner violence:    Fear of current or ex partner: Not on file    Emotionally abused: Not on file    Physically abused: Not on file    Forced sexual activity: Not on file  Other Topics Concern  . Not on file  Social History Narrative   Marital status: married x 49 years      Children: 2 children/sons; 2 grandchildren      Lives: with wife      Employment: retired; stays busy      Tobacco: none      Alcohol: 3 alcoholic drinks on weekends      Exercise: stays very active; was swimming but stopped in 2017.  Walking.      Advanced Directives: YES; FULL CODE; no prolonged measures.    Family History  Problem Relation Age of Onset  . Heart disease Mother        CAD and CHF     Review of Systems  Constitutional: Negative.  Negative for chills and fever.  HENT: Negative.  Negative for hearing loss.   Eyes: Negative.  Negative for blurred vision and double vision.  Respiratory: Negative.  Negative for cough and shortness of breath.   Cardiovascular: Negative.  Negative for chest pain and palpitations.  Gastrointestinal: Negative.  Negative for abdominal pain, diarrhea, nausea and vomiting.  Genitourinary: Negative.  Negative for dysuria and hematuria.  Musculoskeletal: Positive for back pain and joint pain.  Skin: Negative.  Negative for rash.  Neurological: Negative.  Negative for dizziness and headaches.  Endo/Heme/Allergies: Negative.   All other systems reviewed and are negative.   Vitals:   01/23/18 1016  BP: 116/75  Pulse: 68  Resp: 16  Temp: 97.7 F (36.5 C)  SpO2: 96%    Physical Exam  Constitutional: He is oriented to person, place, and time. He appears well-developed and well-nourished.  HENT:  Head: Normocephalic and atraumatic.  Nose: Nose normal.    Mouth/Throat: Oropharynx is clear and moist.  Eyes: Pupils are equal, round, and reactive to light. Conjunctivae and EOM are normal.  Neck: Normal range of motion. Neck supple.  Cardiovascular: Normal rate, regular rhythm and normal heart sounds.  Pulmonary/Chest: Effort normal and breath sounds normal.  Abdominal: Soft. There is no tenderness.  Musculoskeletal: Normal range of motion.  Neurological: He is alert and oriented to person, place, and time. No sensory deficit. He exhibits normal muscle tone.  Skin: Skin is warm and dry. Capillary refill takes less than 2 seconds.  Psychiatric: He has  a normal mood and affect. His behavior is normal.  Vitals reviewed.    ASSESSMENT & PLAN: Deshon was seen today for medication refill.  Diagnoses and all orders for this visit:  Chronic pain syndrome -     Ambulatory referral to Pain Clinic -     HYDROcodone-acetaminophen (NORCO/VICODIN) 5-325 MG tablet; Take 1 tablet by mouth every 8 (eight) hours as needed for moderate pain.  Psychophysiological insomnia -     zolpidem (AMBIEN) 10 MG tablet; Take 1 tablet (10 mg total) by mouth at bedtime.  Essential hypertension -     benazepril-hydrochlorthiazide (LOTENSIN HCT) 20-25 MG tablet; Take 1 tablet by mouth daily.  Hypothyroidism due to acquired atrophy of thyroid -     levothyroxine (SYNTHROID, LEVOTHROID) 125 MCG tablet; TAKE 1 TABLET BY MOUTH EVERY DAY BEFORE BREAKFAST  Anxiety state -     LORazepam (ATIVAN) 1 MG tablet; TAKE 1 TABLET BY MOUTH EVERY DAY AS NEEDED FOR ANXIETY  Lower urinary tract symptoms (LUTS) -     tamsulosin (FLOMAX) 0.4 MG CAPS capsule; Take 1 capsule (0.4 mg total) by mouth daily after breakfast.    Patient Instructions       If you have lab work done today you will be contacted with your lab results within the next 2 weeks.  If you have not heard from [place] then please contact us. The fastest way to get your results is to register for My Chart.   IF you  received an x-ray today, you will receive an invoice from [facility]. Please contact [facility] at [phone] with questions or concerns regarding your invoice.   IF you received labwork today, you will receive an invoice from [facility]. Please contact [facility] at [phone] with questions or concerns regarding your invoice.   Our billing staff will not be able to assist you with questions regarding bills from these companies.  You will be contacted with the lab results as soon as they are available. The fastest way to get your results is to activate your My Chart account. Instructions are located on the last page of this paperwork. If you have not heard from [place] regarding the results in 2 weeks, please contact this office.     Chronic Pain, Adult Chronic pain is a type of pain that lasts or keeps coming back (recurs) for at least six months. You may have chronic headaches, abdominal pain, or body pain. Chronic pain may be related to an illness, such as fibromyalgia or complex regional pain syndrome. Sometimes the cause of chronic pain is not known. Chronic pain can make it hard for you to do daily activities. If not treated, chronic pain can lead to other health problems, including anxiety and depression. Treatment depends on the cause and severity of your pain. You may need to work with a pain specialist to come up with a treatment plan. The plan may include medicine, counseling, and physical therapy. Many people benefit from a combination of two or more types of treatment to control their pain. Follow these instructions at home: Lifestyle  Consider keeping a pain diary to share with your health care providers.  Consider talking with a mental health care provider (psychologist) about how to cope with chronic pain.  Consider joining a chronic pain support group.  Try to control or lower your stress levels. Talk to your health care provider about strategies to do  this. General instructions   Take over-the-counter and prescription medicines only as told by your  health care provider.  Follow your treatment plan as told by your health care provider. This may include: ? Gentle, regular exercise. ? Eating a healthy diet that includes foods such as vegetables, fruits, fish, and lean meats. ? Cognitive or behavioral therapy. ? Working with a Community education officer. ? Meditation or yoga. ? Acupuncture or massage therapy. ? Aroma, color, light, or sound therapy. ? Local electrical stimulation. ? Shots (injections) of numbing or pain-relieving medicines into the spine or the area of pain.  Check your pain level as told by your health care provider. Ask your health care provider if you should use a pain scale.  Learn as much as you can about how to manage your chronic pain. Ask your health care provider if an intensive pain rehabilitation program or a chronic pain specialist would be helpful.  Keep all follow-up visits as told by your health care provider. This is important. Contact a health care provider if:  Your pain gets worse.  You have new pain.  You have trouble sleeping.  You have trouble doing your normal activities.  Your pain is not controlled with treatment.  Your have side effects from pain medicine.  You feel weak. Get help right away if:  You lose feeling or have numbness in your body.  You lose control of bowel or bladder function.  Your pain suddenly gets much worse.  You develop shaking or chills.  You develop confusion.  You develop chest pain.  You have trouble breathing or shortness of breath.  You pass out.  You have thoughts about hurting yourself or others. This information is not intended to replace advice given to you by your health care provider. Make sure you discuss any questions you have with your health care provider. Document Released: 12/24/2001 Document Revised: 12/02/2015 Document Reviewed:  09/21/2015 Elsevier Interactive Patient Education  2018 Elsevier Inc.      Agustina Caroli, MD [facility]

## 2018-01-27 ENCOUNTER — Other Ambulatory Visit: Payer: Self-pay | Admitting: Family Medicine

## 2018-01-27 DIAGNOSIS — F5104 Psychophysiologic insomnia: Secondary | ICD-10-CM

## 2018-02-07 NOTE — Telephone Encounter (Signed)
DONE

## 2018-03-27 ENCOUNTER — Encounter (INDEPENDENT_AMBULATORY_CARE_PROVIDER_SITE_OTHER): Payer: Self-pay | Admitting: Radiology

## 2018-03-27 ENCOUNTER — Telehealth (INDEPENDENT_AMBULATORY_CARE_PROVIDER_SITE_OTHER): Payer: Self-pay | Admitting: Specialist

## 2018-03-27 NOTE — Telephone Encounter (Signed)
Note has been written and becky is aware its at the front desk and ready for pick up

## 2018-03-27 NOTE — Telephone Encounter (Signed)
Patient's wife Henry [name] called asked if Dr Louanne Skye would write a note stating patient has metal in his body. She said they are going to visit someone and have to go through a metal detector. The number to contact Henry [name] is [phone]

## 2018-05-21 ENCOUNTER — Other Ambulatory Visit: Payer: Self-pay | Admitting: Emergency Medicine

## 2018-05-21 DIAGNOSIS — F411 Generalized anxiety disorder: Secondary | ICD-10-CM

## 2018-05-21 NOTE — Telephone Encounter (Signed)
Requested medication (s) are due for refill today: yes  Requested medication (s) are on the active medication list: yes  Last refill:  01/23/18   Future visit scheduled: yes  Notes to clinic:  Not delegated    Requested Prescriptions  Pending Prescriptions Disp Refills   LORazepam (ATIVAN) 1 MG tablet [Pharmacy Med Name: LORAZEPAM 1 MG TABLET] 30 tablet 1    Sig: TAKE 1 TABLET BY MOUTH EVERY DAY AS NEEDED FOR ANXIETY     Not Delegated - Psychiatry:  Anxiolytics/Hypnotics Failed - 05/21/2018  4:35 PM      Failed - This refill cannot be delegated      Failed - Urine Drug Screen completed in last 360 days.      Passed - Valid encounter within last 6 months    Recent Outpatient Visits          3 months ago Chronic pain syndrome   Primary Care at [facility], [place], MD   9 months ago Tick bite of axillary region, left, initial encounter   Primary Care at [facility], Renette Butters, MD   10 months ago Medicare annual wellness visit, subsequent   Primary Care at [facility], Renette Butters, MD   1 year ago Medicare annual wellness visit, subsequent   Primary Care at [facility], Renette Butters, MD   1 year ago Encounter for annual physical exam   [facility] at [facility], Lilia Argue, MD      Future Appointments            Tomorrow Jessy Oto, MD [facility]

## 2018-05-22 ENCOUNTER — Ambulatory Visit (INDEPENDENT_AMBULATORY_CARE_PROVIDER_SITE_OTHER): Payer: Medicare HMO

## 2018-05-22 ENCOUNTER — Ambulatory Visit (INDEPENDENT_AMBULATORY_CARE_PROVIDER_SITE_OTHER): Payer: Medicare HMO | Admitting: Specialist

## 2018-05-22 ENCOUNTER — Encounter (INDEPENDENT_AMBULATORY_CARE_PROVIDER_SITE_OTHER): Payer: Self-pay | Admitting: Specialist

## 2018-05-22 VITALS — BP 120/73 | HR 81 | Ht 65.0 in | Wt 181.0 lb

## 2018-05-22 DIAGNOSIS — T1490XS Injury, unspecified, sequela: Secondary | ICD-10-CM

## 2018-05-22 DIAGNOSIS — M19112 Post-traumatic osteoarthritis, left shoulder: Secondary | ICD-10-CM

## 2018-05-22 DIAGNOSIS — M542 Cervicalgia: Secondary | ICD-10-CM | POA: Diagnosis not present

## 2018-05-22 DIAGNOSIS — M4014 Other secondary kyphosis, thoracic region: Secondary | ICD-10-CM

## 2018-05-22 DIAGNOSIS — G894 Chronic pain syndrome: Secondary | ICD-10-CM | POA: Diagnosis not present

## 2018-05-22 DIAGNOSIS — M19012 Primary osteoarthritis, left shoulder: Secondary | ICD-10-CM

## 2018-05-22 DIAGNOSIS — M4722 Other spondylosis with radiculopathy, cervical region: Secondary | ICD-10-CM | POA: Diagnosis not present

## 2018-05-22 MED ORDER — TRAMADOL HCL 50 MG PO TABS: 50.0000 mg | ORAL_TABLET | Freq: Four times a day (QID) | ORAL | 0 refills | Status: DC | PRN

## 2018-05-22 MED ORDER — NABUMETONE 500 MG PO TABS: 500.0000 mg | ORAL_TABLET | Freq: Two times a day (BID) | ORAL | 3 refills | Status: DC

## 2018-05-22 MED ORDER — BUPIVACAINE HCL 0.25 % IJ SOLN
4.0000 mL | INTRAMUSCULAR | Status: AC | PRN
  Administered 2018-05-22: 4 mL via INTRA_ARTICULAR

## 2018-05-22 MED ORDER — METHYLPREDNISOLONE ACETATE 40 MG/ML IJ SUSP
40.0000 mg | INTRAMUSCULAR | Status: AC | PRN
  Administered 2018-05-22: 40 mg via INTRA_ARTICULAR
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
## 2018-05-22 NOTE — Patient Instructions (Signed)
Avoid overhead lifting and overhead use of the arms. Do not lift greater than 5 lbs. Adjust head rest in vehicle to prevent hyperextension if rear ended. Take extra precautions to avoid falling. Avoid overhead lifting and overhead use of the arms. Do not lift greater than 10 lbs. Tylenol ES one every 6-8 hours for pain and inflamation. PT for another 3-4 weeks, for cervical spine and thoracic spine then a home exercise program. Referral to Dr. Ernestina Patches to have EMG/NCV of the left arm to assess for cervical radiculopathy severity.

## 2018-05-27 NOTE — Telephone Encounter (Signed)
Med request. Last office visit 01/23/2018. Last filled 01/23/18, #30. No follow up appointment on file.

## 2018-07-11 ENCOUNTER — Other Ambulatory Visit: Payer: Self-pay | Admitting: Emergency Medicine

## 2018-07-11 DIAGNOSIS — R399 Unspecified symptoms and signs involving the genitourinary system: Secondary | ICD-10-CM

## 2018-07-17 ENCOUNTER — Other Ambulatory Visit: Payer: Self-pay

## 2018-07-17 ENCOUNTER — Telehealth (INDEPENDENT_AMBULATORY_CARE_PROVIDER_SITE_OTHER): Payer: Medicare HMO | Admitting: Emergency Medicine

## 2018-07-17 ENCOUNTER — Ambulatory Visit: Payer: Medicare HMO | Admitting: Emergency Medicine

## 2018-07-17 ENCOUNTER — Telehealth: Payer: Self-pay | Admitting: Emergency Medicine

## 2018-07-17 DIAGNOSIS — D500 Iron deficiency anemia secondary to blood loss (chronic): Secondary | ICD-10-CM | POA: Diagnosis not present

## 2018-07-17 DIAGNOSIS — I10 Essential (primary) hypertension: Secondary | ICD-10-CM

## 2018-07-17 DIAGNOSIS — E034 Atrophy of thyroid (acquired): Secondary | ICD-10-CM | POA: Diagnosis not present

## 2018-07-17 DIAGNOSIS — E7800 Pure hypercholesterolemia, unspecified: Secondary | ICD-10-CM

## 2018-07-17 NOTE — Telephone Encounter (Signed)
07/17/2018 - PATIENT HAD A TELEMED VISIT TODAY 07/17/2018 WITH DR. Kittie Plater. I WAS CALLING HIM TO SCHEDULE HIS 3 MONTH FOLLOW-UP IN July 2020. HIS WIFE TOOK DOWN THE APPOINTMENT. SHE SAID DR. MIGUEL ALSO ORDERED HER HUSBAND TO HAVE SOME LAB WORK DONE. THE ORDERS ARE IN BUT MRS. Marten WANTED HER HUSBAND TO CALL AND SCHEDULE A NURSES VISIT HIMSELF, SO SHE WILL HAVE HIM TO RETURN MY CALL. [place]

## 2018-07-17 NOTE — Progress Notes (Signed)
Telemedicine Encounter- SOAP NOTE Established Patient  This telephone encounter was conducted with the patient's (or proxy's) verbal consent via audio telecommunications: yes/no: Yes Patient was instructed to have this encounter in a suitably private space; and to only have persons present to whom they give permission to participate. In addition, patient identity was confirmed by use of name plus two identifiers (DOB and address).  I discussed the limitations, risks, security and privacy concerns of performing an evaluation and management service by telephone and the availability of in person appointments. I also discussed with the patient that there may be a patient responsible charge related to this service. The patient expressed understanding and agreed to proceed.  I spent a total of TIME; 0 MIN TO 60 MIN: 15 minutes talking with the patient or their proxy.  No chief complaint on file.   Subjective   Henry [name] is a 73 y.o. male established patient. Telephone visit today for follow-up of chronic medical problems.  Patient has the following problems: 1.  Essential hypertension, on medication, doing well. 2.  Dyslipidemia 3.  Hypothyroidism, on medication, doing well. 4.  Chronic anemia, sees GI doctor at [facility] for chronic blood loss. 5.  Chronic pain syndrome, doing much better, has seen orthopedist, pain management not needed but contacted him as requested during her last visit. Today has no complaints or medical concerns.  HPI   Patient Active Problem List   Diagnosis Date Noted  . Irritable bowel syndrome with diarrhea 09/07/2016  . Iron deficiency anemia due to chronic blood loss 09/07/2016  . Hypothyroidism due to acquired atrophy of thyroid 08/15/2016  . Degenerative disc disease, lumbar 03/16/2014  . Insomnia 03/16/2014  . Anxiety state 03/16/2014  . Chronic pain syndrome 03/16/2014  . ARTERIOVENOUS MALFORMATION 07/16/2009  . Essential hypertension  07/19/2007  . DIVERTICULITIS, HX OF 07/19/2007    Past Medical History:  Diagnosis Date  . Anxiety   . Arthritis   . Degenerative disc disease, lumbar   . Hypertension   . Hypothyroidism     Current Outpatient Medications  Medication Sig Dispense Refill  . benazepril-hydrochlorthiazide (LOTENSIN HCT) 20-25 MG tablet Take 1 tablet by mouth daily. 90 tablet 1  . cholecalciferol (VITAMIN D) 1000 UNITS tablet Take 1,000 Units by mouth daily.    . diclofenac sodium (VOLTAREN) 1 % GEL Apply 4 g topically 4 (four) times daily. 5 Tube 1  . fish oil-omega-3 fatty acids 1000 MG capsule Take 2 g by mouth daily.    Marland Kitchen levothyroxine (SYNTHROID, LEVOTHROID) 125 MCG tablet TAKE 1 TABLET BY MOUTH EVERY DAY BEFORE BREAKFAST 90 tablet 3  . LORazepam (ATIVAN) 1 MG tablet TAKE 1 TABLET BY MOUTH EVERY DAY AS NEEDED FOR ANXIETY 20 tablet 1  . nabumetone (RELAFEN) 500 MG tablet Take 1 tablet (500 mg total) by mouth 2 (two) times daily. 60 tablet 3  . tamsulosin (FLOMAX) 0.4 MG CAPS capsule TAKE 1 CAPSULE (0.4 MG TOTAL) BY MOUTH DAILY AFTER BREAKFAST. 90 capsule 1  . zolpidem (AMBIEN) 10 MG tablet Take 1 tablet (10 mg total) by mouth at bedtime. 30 tablet 5  . traMADol (ULTRAM) 50 MG tablet Take 1 tablet (50 mg total) by mouth every 6 (six) hours as needed for up to 7 days. 30 tablet 0   No current facility-administered medications for this visit.     Allergies  Allergen Reactions  . Ciprofloxacin Itching and Rash    Social History   Socioeconomic History  .  Marital status: Married    Spouse name: Not on file  . Number of children: Not on file  . Years of education: Not on file  . Highest education level: Not on file  Occupational History  . Not on file  Social Needs  . Financial resource strain: Not on file  . Food insecurity:    Worry: Not on file    Inability: Not on file  . Transportation needs:    Medical: Not on file    Non-medical: Not on file  Tobacco Use  . Smoking status: Never  Smoker  . Smokeless tobacco: Never Used  Substance and Sexual Activity  . Alcohol use: Not on file  . Drug use: Not on file  . Sexual activity: Yes  Lifestyle  . Physical activity:    Days per week: Not on file    Minutes per session: Not on file  . Stress: Not on file  Relationships  . Social connections:    Talks on phone: Not on file    Gets together: Not on file    Attends religious service: Not on file    Active member of club or organization: Not on file    Attends meetings of clubs or organizations: Not on file    Relationship status: Not on file  . Intimate partner violence:    Fear of current or ex partner: Not on file    Emotionally abused: Not on file    Physically abused: Not on file    Forced sexual activity: Not on file  Other Topics Concern  . Not on file  Social History Narrative   Marital status: married x 49 years      Children: 2 children/sons; 2 grandchildren      Lives: with wife      Employment: retired; stays busy      Tobacco: none      Alcohol: 3 alcoholic drinks on weekends      Exercise: stays very active; was swimming but stopped in 2017.  Walking.      Advanced Directives: YES; FULL CODE; no prolonged measures.    Review of Systems  Constitutional: Negative.  Negative for chills and fever.  HENT: Negative for congestion and sore throat.   Eyes: Negative for discharge and redness.  Respiratory: Negative for cough and shortness of breath.   Cardiovascular: Negative for chest pain and palpitations.  Gastrointestinal: Negative for abdominal pain, diarrhea, nausea and vomiting.  Musculoskeletal: Negative for myalgias.  Neurological: Negative for dizziness and headaches.  All other systems reviewed and are negative.   Objective   Vitals as reported by the patient: There were no vitals filed for this visit.  Diagnoses and all orders for this visit:  Essential hypertension -     Comprehensive metabolic panel  Iron deficiency anemia due  to chronic blood loss -     CBC with Differential/Platelet -     Anemia panel  Pure hypercholesterolemia -     Lipid panel  Hypothyroidism due to acquired atrophy of thyroid -     T4, free -     TSH  Medical concerns identified during this visit. Continue present medications.  We will schedule blood work visit. Follow-up in 3 to 6 months.   I discussed the assessment and treatment plan with the patient. The patient was provided an opportunity to ask questions and all were answered. The patient agreed with the plan and demonstrated an understanding of the instructions.   The patient  was advised to call back or seek an in-person evaluation if the symptoms worsen or if the condition fails to improve as anticipated.  I provided 15 minutes of non-face-to-face time during this encounter.  Horald Pollen, MD  Primary Care at [facility]

## 2018-07-17 NOTE — Addendum Note (Signed)
Addended by: Donnelly Stager on: 07/17/2018 11:45 AM   Modules accepted: Orders

## 2018-07-24 ENCOUNTER — Ambulatory Visit: Payer: Medicare HMO | Admitting: Emergency Medicine

## 2018-07-29 ENCOUNTER — Other Ambulatory Visit: Payer: Self-pay | Admitting: Emergency Medicine

## 2018-07-29 DIAGNOSIS — F5104 Psychophysiologic insomnia: Secondary | ICD-10-CM

## 2018-07-29 NOTE — Telephone Encounter (Signed)
Requested medication (s) are due for refill today -yes  Requested medication (s) are on the active medication list -yes  Future visit scheduled -yes  Last refill: 01/23/18  Notes to clinic: Patient is requesting refill of non delegated Rx- sent for PCP review of request.  Requested Prescriptions  Pending Prescriptions Disp Refills   zolpidem (AMBIEN) 10 MG tablet [Pharmacy Med Name: ZOLPIDEM TARTRATE 10 MG TABLET] 30 tablet     Sig: TAKE 1 TABLET BY MOUTH EVERYDAY AT BEDTIME     Not Delegated - Psychiatry:  Anxiolytics/Hypnotics Failed - 07/29/2018  4:15 PM      Failed - This refill cannot be delegated      Failed - Urine Drug Screen completed in last 360 days.      Failed - Valid encounter within last 6 months    Recent Outpatient Visits          6 months ago Chronic pain syndrome   Primary Care at [facility], [place], [place]   11 months ago Tick bite of axillary region, left, initial encounter   Primary Care at [facility], Renette Butters, MD   1 year ago Medicare annual wellness visit, subsequent   Primary Care at [facility], Renette Butters, MD   1 year ago Medicare annual wellness visit, subsequent   Primary Care at [facility], Renette Butters, MD   1 year ago Encounter for annual physical exam   [facility] at [facility], Lilia Argue, MD      Future Appointments            In 2 months Sagardia, Ines Bloomer, MD Primary Care at [place], [facility]            Requested Prescriptions  Pending Prescriptions Disp Refills   zolpidem (AMBIEN) 10 MG tablet [Pharmacy Med Name: ZOLPIDEM TARTRATE 10 MG TABLET] 30 tablet     Sig: TAKE 1 TABLET BY MOUTH EVERYDAY AT BEDTIME     Not Delegated - Psychiatry:  Anxiolytics/Hypnotics Failed - 07/29/2018  4:15 PM      Failed - This refill cannot be delegated      Failed - Urine Drug Screen completed in last 360 days.      Failed - Valid encounter within last 6 months    Recent Outpatient Visits          6 months ago Chronic pain syndrome   Primary Care at [facility], [place], [place]   11 months ago Tick bite of axillary region, left, initial encounter   Primary Care at [facility], Renette Butters, MD   1 year ago Medicare annual wellness visit, subsequent   Primary Care at [facility], Renette Butters, MD   1 year ago Medicare annual wellness visit, subsequent   Primary Care at [facility], Renette Butters, MD   1 year ago Encounter for annual physical exam   [facility] at [facility], Lilia Argue, MD      Future Appointments            In 2 months Sagardia, Ines Bloomer, MD Primary Care at [place], [facility]

## 2018-07-31 ENCOUNTER — Ambulatory Visit (INDEPENDENT_AMBULATORY_CARE_PROVIDER_SITE_OTHER): Payer: Medicare HMO | Admitting: Family Medicine

## 2018-07-31 ENCOUNTER — Other Ambulatory Visit: Payer: Self-pay

## 2018-07-31 DIAGNOSIS — D500 Iron deficiency anemia secondary to blood loss (chronic): Secondary | ICD-10-CM | POA: Diagnosis not present

## 2018-07-31 DIAGNOSIS — I10 Essential (primary) hypertension: Secondary | ICD-10-CM

## 2018-07-31 DIAGNOSIS — E034 Atrophy of thyroid (acquired): Secondary | ICD-10-CM | POA: Diagnosis not present

## 2018-08-01 LAB — CBC WITH DIFFERENTIAL/PLATELET
Basophils Absolute: 0.1 10*3/uL (ref 0.0–0.2)
Basos: 1 %
EOS (ABSOLUTE): 0.1 10*3/uL (ref 0.0–0.4)
Eos: 2 %
Hemoglobin: 15.3 g/dL (ref 13.0–17.7)
Immature Grans (Abs): 0 10*3/uL (ref 0.0–0.1)
Immature Granulocytes: 0 %
Lymphocytes Absolute: 1.9 10*3/uL (ref 0.7–3.1)
Lymphs: 25 %
MCH: 26.5 pg — ABNORMAL LOW (ref 26.6–33.0)
MCHC: 31.9 g/dL (ref 31.5–35.7)
MCV: 83 fL (ref 79–97)
Monocytes Absolute: 0.8 10*3/uL (ref 0.1–0.9)
Monocytes: 10 %
Neutrophils Absolute: 4.9 10*3/uL (ref 1.4–7.0)
Neutrophils: 62 %
Platelets: 301 10*3/uL (ref 150–450)
RBC: 5.78 x10E6/uL (ref 4.14–5.80)
RDW: 14.4 % (ref 11.6–15.4)
WBC: 7.8 10*3/uL (ref 3.4–10.8)

## 2018-08-01 LAB — COMPREHENSIVE METABOLIC PANEL
ALT: 15 IU/L (ref 0–44)
AST: 12 IU/L (ref 0–40)
Albumin/Globulin Ratio: 1.6 (ref 1.2–2.2)
Albumin: 4.3 g/dL (ref 3.7–4.7)
Alkaline Phosphatase: 77 IU/L (ref 39–117)
BUN/Creatinine Ratio: 12 (ref 10–24)
BUN: 14 mg/dL (ref 8–27)
Bilirubin Total: 1.3 mg/dL — ABNORMAL HIGH (ref 0.0–1.2)
CO2: 19 mmol/L — ABNORMAL LOW (ref 20–29)
Calcium: 9.8 mg/dL (ref 8.6–10.2)
Chloride: 100 mmol/L (ref 96–106)
Creatinine, Ser: 1.2 mg/dL (ref 0.76–1.27)
GFR calc Af Amer: 70 mL/min/{1.73_m2} (ref >59)
GFR calc non Af Amer: 60 mL/min/{1.73_m2} (ref >59)
Globulin, Total: 2.7 g/dL (ref 1.5–4.5)
Glucose: 100 mg/dL — ABNORMAL HIGH (ref 65–99)
Potassium: 4.1 mmol/L (ref 3.5–5.2)
Sodium: 137 mmol/L (ref 134–144)
Total Protein: 7 g/dL (ref 6.0–8.5)

## 2018-08-01 LAB — ANEMIA PANEL
Ferritin: 14 ng/mL — ABNORMAL LOW (ref 30–400)
Folate, Hemolysate: 475 ng/mL
Folate, RBC: 992 ng/mL (ref >498)
Hematocrit: 47.9 % (ref 37.5–51.0)
Iron Saturation: 21 % (ref 15–55)
Iron: 91 ug/dL (ref 38–169)
Retic Ct Pct: 1.1 % (ref 0.6–2.6)
Total Iron Binding Capacity: 424 ug/dL (ref 250–450)
UIBC: 333 ug/dL (ref 111–343)
Vitamin B-12: >2000 pg/mL — ABNORMAL HIGH (ref 232–1245)

## 2018-08-01 LAB — THYROID PANEL WITH TSH
Free Thyroxine Index: 2.7 (ref 1.2–4.9)
T3 Uptake Ratio: 28 % (ref 24–39)
T4, Total: 9.8 ug/dL (ref 4.5–12.0)
TSH: 0.358 u[IU]/mL — ABNORMAL LOW (ref 0.450–4.500)

## 2018-08-01 LAB — LIPID PANEL
Chol/HDL Ratio: 4.7 ratio (ref 0.0–5.0)
Cholesterol, Total: 164 mg/dL (ref 100–199)
HDL: 35 mg/dL — ABNORMAL LOW (ref >39)
LDL Calculated: 105 mg/dL — ABNORMAL HIGH (ref 0–99)
Triglycerides: 120 mg/dL (ref 0–149)
VLDL Cholesterol Cal: 24 mg/dL (ref 5–40)

## 2018-08-28 ENCOUNTER — Ambulatory Visit: Payer: Self-pay

## 2018-08-28 ENCOUNTER — Telehealth: Payer: Self-pay | Admitting: Emergency Medicine

## 2018-08-28 ENCOUNTER — Other Ambulatory Visit: Payer: Self-pay | Admitting: Emergency Medicine

## 2018-08-28 DIAGNOSIS — F5104 Psychophysiologic insomnia: Secondary | ICD-10-CM

## 2018-08-28 NOTE — Telephone Encounter (Signed)
  Reason for Disposition . [1] COVID-19 EXPOSURE (Close Contact) within last 14 days AND [2] NO cough, fever, or breathing difficulty  Answer Assessment - Initial Assessment Questions 1. CLOSE CONTACT: "Who is the person with the confirmed or suspected COVID-19 infection that you were exposed to?"     Son who was released from prison yesterday.(in prison in [place] 2. PLACE of CONTACT: "Where were you when you were exposed to COVID-19?" (e.g., home, school, medical waiting room; which city?)     Son came home to patients home on 5/12 3. TYPE of CONTACT: "How much contact was there?" (e.g., sitting next to, live in same house, work in same office, same building)     Staying in same house 4. DURATION of CONTACT: "How long were you in contact with the COVID-19 patient?" (e.g., a few seconds, passed by person, a few minutes, live with the patient)    Stayed in same house with patient overnight  5. DATE of CONTACT: "When did you have contact with a COVID-19 patient?" (e.g., how many days ago)     08/27/18 6. TRAVEL: "Have you traveled out of the country recently?" If so, "When and where?"     * Also ask about out-of-state travel, since the CDC has identified some high risk cities for community spread in the [place].     * Note: Travel becomes less relevant if there is widespread community transmission where the patient lives.     Denied travel out of state or country  7. COMMUNITY SPREAD: "Are there lots of cases of COVID-19 (community spread) where you live?" (See public health department website, if unsure)   * MAJOR community spread: high number of cases; numbers of cases are increasing; many people hospitalized.   * MINOR community spread: low number of cases; not increasing; few or no people hospitalized     Present in the community 8. SYMPTOMS: "Do you have any symptoms?" (e.g., fever, cough, breathing difficulty)     "Feels fine"; denied symptoms 9. PREGNANCY OR POSTPARTUM: "Is there any  chance you are pregnant?" "When was your last menstrual period?" "Did you deliver in the last 2 weeks?"     N/a  10. HIGH RISK: "Do you have any heart or lung problems? Do you have a weak immune system?" (e.g., CHF, COPD, asthma, HIV positive, chemotherapy, renal failure, diabetes mellitus, sickle cell anemia)      Has hypertension  Protocols used: CORONAVIRUS (COVID-19) EXPOSURE-A-AH

## 2018-08-28 NOTE — Telephone Encounter (Signed)
Pt's son(Daniel Dipierro-[id number])  just got out of jail after years and was very sick in February. He is now living with his older parents. He feels like he has had covid and would like a test.

## 2018-08-28 NOTE — Telephone Encounter (Signed)
Pt and wife would like a covid 19 test done at [facility]. Pt said they were told they needed an order to get this test done. Please advise pt and wife at [phone]

## 2018-08-28 NOTE — Telephone Encounter (Signed)
L/m for pt that he would need appt with PCP to determine if he qualifies for testing.  If asymptomatic, provider will most likely not order test.  Please c/b to clinic to schedule appt.

## 2018-08-28 NOTE — Telephone Encounter (Signed)
Patient called and says his son was just released from prison and brought to his home on yesterday. He says that the coronavirus broke out in the prison system and his son was not tested. He says his son was sick in February, but was not tested. He says now his son is at home and he's afraid for he and his wife. Patient says both he and his wife are elderly and are worried about the coronavirus. He says no one is having symptoms. He says he called [facility] and was told that that he would need an order from his provider. He says so he's calling to see if he, his wife and son could get orders to be tested. I called the office and spoke to [place], [facility] who asked to speak to the patient, the call was connected successfully.

## 2018-09-01 ENCOUNTER — Other Ambulatory Visit: Payer: Self-pay | Admitting: Emergency Medicine

## 2018-09-01 DIAGNOSIS — F5104 Psychophysiologic insomnia: Secondary | ICD-10-CM

## 2018-09-03 ENCOUNTER — Other Ambulatory Visit: Payer: Self-pay | Admitting: Emergency Medicine

## 2018-09-03 ENCOUNTER — Telehealth: Payer: Self-pay | Admitting: Emergency Medicine

## 2018-09-03 DIAGNOSIS — I10 Essential (primary) hypertension: Secondary | ICD-10-CM

## 2018-09-03 DIAGNOSIS — F5104 Psychophysiologic insomnia: Secondary | ICD-10-CM

## 2018-09-03 NOTE — Telephone Encounter (Signed)
Requested medication (s) are due for refill today: per chart med was refilled 5-16  Requested medication (s) are on the active medication list: yes  Last refill:  08/31/18  Future visit scheduled: yes  Notes to clinic:  Received electronic request from pharmacy. Per chart refill was ordered on 08/31/18. Called pharmacy and pharmacist stated that they have no refill requests received on 08/31/18. Routing to office.   Requested Prescriptions  Pending Prescriptions Disp Refills   zolpidem (AMBIEN) 10 MG tablet [Pharmacy Med Name: ZOLPIDEM TARTRATE 10 MG TABLET] 30 tablet     Sig: TAKE 1 TABLET BY MOUTH EVERYDAY AT BEDTIME     Not Delegated - Psychiatry:  Anxiolytics/Hypnotics Failed - 09/03/2018  9:54 AM      Failed - This refill cannot be delegated      Failed - Urine Drug Screen completed in last 360 days.      Failed - Valid encounter within last 6 months    Recent Outpatient Visits          1 month ago Essential hypertension   Primary Care at [facility], Ranell Patrick, MD   7 months ago Chronic pain syndrome   Primary Care at [facility], Ines Bloomer, MD   1 year ago Tick bite of axillary region, left, initial encounter   Primary Care at [facility], Renette Butters, MD   1 year ago Medicare annual wellness visit, subsequent   Primary Care at [facility], Renette Butters, MD   1 year ago Medicare annual wellness visit, subsequent   Primary Care at [facility], Renette Butters, MD      Future Appointments            In 1 month Sagardia, Ines Bloomer, MD Primary Care at [place], [facility]

## 2018-09-03 NOTE — Telephone Encounter (Signed)
Copied from [place] [phone]. Topic: General - Other >> Sep 03, 2018  9:48 AM Leward Quan A wrote: Reason for CRM: Patient wife called to say that pharmacy did not recieve the Rx for zolpidem (AMBIEN) 10 MG tablet. Please resend

## 2018-09-04 NOTE — Telephone Encounter (Signed)
Patient wife called Jacqlyn Larsen stating she wants to speak to PCP or nurse regarding zolpidem (AMBIEN) 10 MG tablet. On why it was denied because she says she never picked up prescribed on 5/16.  Please advise [phone]

## 2018-09-05 NOTE — Telephone Encounter (Signed)
Pt's wife Jacqlyn Larsen called to check status on refill request. Please advise

## 2018-09-06 NOTE — Telephone Encounter (Signed)
Rx has been called in today

## 2018-09-24 DIAGNOSIS — H524 Presbyopia: Secondary | ICD-10-CM | POA: Diagnosis not present

## 2018-09-30 ENCOUNTER — Telehealth: Payer: Self-pay | Admitting: Emergency Medicine

## 2018-09-30 DIAGNOSIS — F5104 Psychophysiologic insomnia: Secondary | ICD-10-CM

## 2018-09-30 NOTE — Telephone Encounter (Signed)
This patient was last seen on 08/31/18 is it ok to refill this med. Patient so have an upcoming appt with you. Patient is requesting a refill of the following medications: Requested Prescriptions   Pending Prescriptions Disp Refills  . zolpidem (AMBIEN) 10 MG tablet [Pharmacy Med Name: ZOLPIDEM TARTRATE 10 MG TABLET] 30 tablet 0    Sig: TAKE 1 TABLET BY MOUTH EVERYDAY AT BEDTIME    Date of patient request: 09/30/18 Last office visit: 08/31/18 Date of last refill: 08/31/18 Last refill amount: 30 Follow up time period per chart: 10/16/18

## 2018-10-03 NOTE — Telephone Encounter (Signed)
Pt want to know why his medication has been changed from 10mg  to .5mg . pt was not advised of this change. Please call pt.

## 2018-10-04 ENCOUNTER — Telehealth: Payer: Self-pay

## 2018-10-04 NOTE — Telephone Encounter (Signed)
Spoke with pt this morning he came into the office very upset because his medication has ben changed. He states she suppose to take 10 mg of Ambien and when he went to the pharmacy they gave him 5 mg tablets which is not correct. He states he never discussed changing his medication dose with provider and would like to know why it was changed. Advised pt that I would send a message to the provider for clarification. He has appointment on 10/2018.

## 2018-10-04 NOTE — Telephone Encounter (Signed)
Please advise on message below. I do not see a note in the chart stating why Ambien medication was reduced.

## 2018-10-04 NOTE — Telephone Encounter (Signed)
Call to pt re Ambien dose.  Pt not available.  Will try again later.

## 2018-10-06 NOTE — Telephone Encounter (Signed)
Geriatric patients should not take more than 5 mg per dose.  Thanks.  It is also a Youth worker.

## 2018-10-06 NOTE — Telephone Encounter (Signed)
Geriatric patients should not take more than 5 mg.  Thanks.

## 2018-10-08 ENCOUNTER — Telehealth: Payer: Self-pay | Admitting: *Deleted

## 2018-10-08 NOTE — Telephone Encounter (Signed)
Attempted to call pt to relay provider message. There was no answer so I left a message to call back

## 2018-10-08 NOTE — Telephone Encounter (Signed)
Copied from [place] [id number]. Topic: General - Call Back - No Documentation >> Oct 08, 2018  4:40 PM Lionel December wrote: Reason for CRM: Patient would like a call back. Stated someone called them today.

## 2018-10-09 NOTE — Telephone Encounter (Signed)
This is not a patient at our office. Please send to MD listed as PCP in patient chart. Nat Christen, CMA

## 2018-10-16 ENCOUNTER — Ambulatory Visit: Payer: Medicare HMO | Admitting: Emergency Medicine

## 2018-10-22 ENCOUNTER — Encounter: Payer: Self-pay | Admitting: Emergency Medicine

## 2018-10-22 ENCOUNTER — Telehealth (INDEPENDENT_AMBULATORY_CARE_PROVIDER_SITE_OTHER): Payer: Medicare HMO | Admitting: Emergency Medicine

## 2018-10-22 ENCOUNTER — Other Ambulatory Visit: Payer: Self-pay

## 2018-10-22 VITALS — BP 120/80 | Ht 66.0 in | Wt 185.0 lb

## 2018-10-22 DIAGNOSIS — R69 Illness, unspecified: Secondary | ICD-10-CM | POA: Diagnosis not present

## 2018-10-22 DIAGNOSIS — F5104 Psychophysiologic insomnia: Secondary | ICD-10-CM | POA: Diagnosis not present

## 2018-10-22 DIAGNOSIS — G894 Chronic pain syndrome: Secondary | ICD-10-CM

## 2018-10-22 MED ORDER — ZOLPIDEM TARTRATE 10 MG PO TABS: 10.0000 mg | ORAL_TABLET | Freq: Every evening | ORAL | 3 refills | Status: DC | PRN

## 2018-10-22 NOTE — Progress Notes (Signed)
Telemedicine Encounter- SOAP NOTE Established Patient  This telephone encounter was conducted with the patient's (or proxy's) verbal consent via audio telecommunications: yes/no: Yes Patient was instructed to have this encounter in a suitably private space; and to only have persons present to whom they give permission to participate. In addition, patient identity was confirmed by use of name plus two identifiers (DOB and address).  I discussed the limitations, risks, security and privacy concerns of performing an evaluation and management service by telephone and the availability of in person appointments. I also discussed with the patient that there may be a patient responsible charge related to this service. The patient expressed understanding and agreed to proceed.  I spent a total of TIME; 0 MIN TO 60 MIN: 10 minutes talking with the patient or their proxy.  No chief complaint on file. Insomnia  Subjective   Henry [name] is a 73 y.o. male established patient. Telephone visit today complaining of insomnia.  States Ambien dose of 5 mg is too low and not effective.  Has taken 10 mg before with good results.  Requesting increased dose.  Has no other complaints or medical concerns today. Active problem list listed below reviewed.  Patient has a history of chronic pain syndrome and pain makes it difficult to sleep.  HPI   Patient Active Problem List   Diagnosis Date Noted  . Irritable bowel syndrome with diarrhea 09/07/2016  . Iron deficiency anemia due to chronic blood loss 09/07/2016  . Hypothyroidism due to acquired atrophy of thyroid 08/15/2016  . Degenerative disc disease, lumbar 03/16/2014  . Insomnia 03/16/2014  . Chronic pain syndrome 03/16/2014  . ARTERIOVENOUS MALFORMATION 07/16/2009  . Essential hypertension 07/19/2007    Past Medical History:  Diagnosis Date  . Anxiety   . Arthritis   . Degenerative disc disease, lumbar   . Hypertension   . Hypothyroidism      Current Outpatient Medications  Medication Sig Dispense Refill  . benazepril-hydrochlorthiazide (LOTENSIN HCT) 20-25 MG tablet TAKE 1 TABLET BY MOUTH EVERY DAY 90 tablet 1  . cholecalciferol (VITAMIN D) 1000 UNITS tablet Take 1,000 Units by mouth daily.    . diclofenac sodium (VOLTAREN) 1 % GEL Apply 4 g topically 4 (four) times daily. 5 Tube 1  . fish oil-omega-3 fatty acids 1000 MG capsule Take 2 g by mouth daily.    Marland Kitchen levothyroxine (SYNTHROID, LEVOTHROID) 125 MCG tablet TAKE 1 TABLET BY MOUTH EVERY DAY BEFORE BREAKFAST 90 tablet 3  . LORazepam (ATIVAN) 1 MG tablet TAKE 1 TABLET BY MOUTH EVERY DAY AS NEEDED FOR ANXIETY 20 tablet 1  . tamsulosin (FLOMAX) 0.4 MG CAPS capsule TAKE 1 CAPSULE (0.4 MG TOTAL) BY MOUTH DAILY AFTER BREAKFAST. 90 capsule 1  . zolpidem (AMBIEN) 10 MG tablet Take 1 tablet (10 mg total) by mouth at bedtime as needed for sleep. Should not be taken every day. 30 tablet 3  . nabumetone (RELAFEN) 500 MG tablet Take 1 tablet (500 mg total) by mouth 2 (two) times daily. (Patient not taking: Reported on 10/22/2018) 60 tablet 3  . traMADol (ULTRAM) 50 MG tablet Take 1 tablet (50 mg total) by mouth every 6 (six) hours as needed for up to 7 days. 30 tablet 0   No current facility-administered medications for this visit.     Allergies  Allergen Reactions  . Ciprofloxacin Itching and Rash    Social History   Socioeconomic History  . Marital status: Married    Spouse name: Not  on file  . Number of children: Not on file  . Years of education: Not on file  . Highest education level: Not on file  Occupational History  . Not on file  Social Needs  . Financial resource strain: Not on file  . Food insecurity    Worry: Not on file    Inability: Not on file  . Transportation needs    Medical: Not on file    Non-medical: Not on file  Tobacco Use  . Smoking status: Never Smoker  . Smokeless tobacco: Never Used  Substance and Sexual Activity  . Alcohol use: Not on file  .  Drug use: Not on file  . Sexual activity: Yes  Lifestyle  . Physical activity    Days per week: Not on file    Minutes per session: Not on file  . Stress: Not on file  Relationships  . Social Herbalist on phone: Not on file    Gets together: Not on file    Attends religious service: Not on file    Active member of club or organization: Not on file    Attends meetings of clubs or organizations: Not on file    Relationship status: Not on file  . Intimate partner violence    Fear of current or ex partner: Not on file    Emotionally abused: Not on file    Physically abused: Not on file    Forced sexual activity: Not on file  Other Topics Concern  . Not on file  Social History Narrative   Marital status: married x 49 years      Children: 2 children/sons; 2 grandchildren      Lives: with wife      Employment: retired; stays busy      Tobacco: none      Alcohol: 3 alcoholic drinks on weekends      Exercise: stays very active; was swimming but stopped in 2017.  Walking.      Advanced Directives: YES; FULL CODE; no prolonged measures.    Review of Systems  Constitutional: Negative.  Negative for chills and fever.  HENT: Negative.  Negative for congestion, nosebleeds, sinus pain and sore throat.   Eyes: Negative.   Respiratory: Negative.  Negative for cough and shortness of breath.   Cardiovascular: Negative.  Negative for chest pain and palpitations.  Gastrointestinal: Negative for abdominal pain, diarrhea, nausea and vomiting.  Genitourinary: Negative.   Musculoskeletal: Positive for back pain and joint pain.  Skin: Negative.  Negative for rash.  Neurological: Negative.  Negative for dizziness and headaches.  Endo/Heme/Allergies: Negative.   All other systems reviewed and are negative.   Objective   Vitals as reported by the patient: Today's Vitals   10/22/18 0953  BP: 120/80  Weight: 185 lb (83.9 kg)  Height: 5\' 6"  (1.676 m)  Alert and oriented x3 in no  apparent respiratory distress.  Diagnoses and all orders for this visit:  Psychophysiological insomnia -     zolpidem (AMBIEN) 10 MG tablet; Take 1 tablet (10 mg total) by mouth at bedtime as needed for sleep. Should not be taken every day.  Chronic pain syndrome   Clinically stable.  No medical concerns identified during this visit. Will increase dose of Ambien to 10 mg at bedtime as needed. Office visit in 3 to 6 months.   I discussed the assessment and treatment plan with the patient. The patient was provided an opportunity to ask questions and  all were answered. The patient agreed with the plan and demonstrated an understanding of the instructions.   The patient was advised to call back or seek an in-person evaluation if the symptoms worsen or if the condition fails to improve as anticipated.  I provided 10 minutes of non-face-to-face time during this encounter.  Horald Pollen, MD  Primary Care at [facility]

## 2018-10-29 ENCOUNTER — Encounter: Payer: Self-pay | Admitting: *Deleted

## 2018-10-30 NOTE — Progress Notes (Signed)
LVM FOR PATIENT TO CB AND SCHEDULE APPNT 10/30/2018

## 2018-11-15 ENCOUNTER — Other Ambulatory Visit: Payer: Self-pay

## 2018-11-15 DIAGNOSIS — Z20822 Contact with and (suspected) exposure to covid-19: Secondary | ICD-10-CM

## 2018-11-15 DIAGNOSIS — R6889 Other general symptoms and signs: Secondary | ICD-10-CM | POA: Diagnosis not present

## 2018-11-17 LAB — NOVEL CORONAVIRUS, NAA: SARS-CoV-2, NAA: NOT DETECTED

## 2018-11-19 DIAGNOSIS — H18413 Arcus senilis, bilateral: Secondary | ICD-10-CM | POA: Diagnosis not present

## 2018-11-19 DIAGNOSIS — H02834 Dermatochalasis of left upper eyelid: Secondary | ICD-10-CM | POA: Diagnosis not present

## 2018-11-19 DIAGNOSIS — H2513 Age-related nuclear cataract, bilateral: Secondary | ICD-10-CM | POA: Diagnosis not present

## 2018-11-19 DIAGNOSIS — H25043 Posterior subcapsular polar age-related cataract, bilateral: Secondary | ICD-10-CM | POA: Diagnosis not present

## 2018-11-19 DIAGNOSIS — H2512 Age-related nuclear cataract, left eye: Secondary | ICD-10-CM | POA: Diagnosis not present

## 2018-11-21 ENCOUNTER — Other Ambulatory Visit: Payer: Self-pay | Admitting: Emergency Medicine

## 2018-11-21 DIAGNOSIS — E034 Atrophy of thyroid (acquired): Secondary | ICD-10-CM

## 2018-11-21 DIAGNOSIS — R399 Unspecified symptoms and signs involving the genitourinary system: Secondary | ICD-10-CM

## 2018-11-21 NOTE — Telephone Encounter (Signed)
Forwarding medication refill request to clinical pool for review. 

## 2018-12-02 DIAGNOSIS — H2512 Age-related nuclear cataract, left eye: Secondary | ICD-10-CM | POA: Diagnosis not present

## 2018-12-02 DIAGNOSIS — H2513 Age-related nuclear cataract, bilateral: Secondary | ICD-10-CM | POA: Diagnosis not present

## 2018-12-03 DIAGNOSIS — H2511 Age-related nuclear cataract, right eye: Secondary | ICD-10-CM | POA: Diagnosis not present

## 2018-12-03 DIAGNOSIS — H25011 Cortical age-related cataract, right eye: Secondary | ICD-10-CM | POA: Diagnosis not present

## 2018-12-03 DIAGNOSIS — H25041 Posterior subcapsular polar age-related cataract, right eye: Secondary | ICD-10-CM | POA: Diagnosis not present

## 2018-12-24 ENCOUNTER — Other Ambulatory Visit: Payer: Self-pay | Admitting: Emergency Medicine

## 2018-12-24 DIAGNOSIS — I10 Essential (primary) hypertension: Secondary | ICD-10-CM

## 2018-12-30 DIAGNOSIS — H2511 Age-related nuclear cataract, right eye: Secondary | ICD-10-CM | POA: Diagnosis not present

## 2019-02-04 ENCOUNTER — Ambulatory Visit: Payer: Medicare HMO | Admitting: Emergency Medicine

## 2019-02-05 ENCOUNTER — Encounter: Payer: Self-pay | Admitting: Emergency Medicine

## 2019-02-25 ENCOUNTER — Other Ambulatory Visit: Payer: Self-pay | Admitting: Emergency Medicine

## 2019-02-25 DIAGNOSIS — F5104 Psychophysiologic insomnia: Secondary | ICD-10-CM

## 2019-02-25 NOTE — Telephone Encounter (Deleted)
Please review for refill for Ambien 10 mg

## 2019-02-25 NOTE — Telephone Encounter (Signed)
Review for refill for ambien 10 mg. Med is controlled medication And med can not be delegated.Marland Kitchen

## 2019-02-28 ENCOUNTER — Other Ambulatory Visit: Payer: Self-pay

## 2019-02-28 DIAGNOSIS — F5104 Psychophysiologic insomnia: Secondary | ICD-10-CM

## 2019-02-28 MED ORDER — ZOLPIDEM TARTRATE 10 MG PO TABS: 10.0000 mg | ORAL_TABLET | Freq: Every evening | ORAL | 3 refills | Status: DC | PRN

## 2019-02-28 NOTE — Telephone Encounter (Signed)
Pt came into the office and he needs a refill on his medication Zolpidem, he states he is completely out of medication and need ASAP. He has made an appointment. Please advise

## 2019-03-04 ENCOUNTER — Encounter: Payer: Self-pay | Admitting: Emergency Medicine

## 2019-03-04 ENCOUNTER — Ambulatory Visit (INDEPENDENT_AMBULATORY_CARE_PROVIDER_SITE_OTHER): Payer: Medicare HMO | Admitting: Emergency Medicine

## 2019-03-04 ENCOUNTER — Other Ambulatory Visit: Payer: Self-pay

## 2019-03-04 VITALS — BP 114/67 | HR 80 | Temp 97.9°F | Resp 16 | Ht 66.0 in | Wt 191.2 lb

## 2019-03-04 DIAGNOSIS — E034 Atrophy of thyroid (acquired): Secondary | ICD-10-CM | POA: Diagnosis not present

## 2019-03-04 DIAGNOSIS — F5104 Psychophysiologic insomnia: Secondary | ICD-10-CM

## 2019-03-04 DIAGNOSIS — R69 Illness, unspecified: Secondary | ICD-10-CM | POA: Diagnosis not present

## 2019-03-04 DIAGNOSIS — I10 Essential (primary) hypertension: Secondary | ICD-10-CM

## 2019-03-04 DIAGNOSIS — R399 Unspecified symptoms and signs involving the genitourinary system: Secondary | ICD-10-CM | POA: Diagnosis not present

## 2019-03-04 DIAGNOSIS — F411 Generalized anxiety disorder: Secondary | ICD-10-CM

## 2019-03-04 MED ORDER — BENAZEPRIL-HYDROCHLOROTHIAZIDE 20-25 MG PO TABS: 1.0000 | ORAL_TABLET | Freq: Every day | ORAL | 3 refills | Status: DC

## 2019-03-04 MED ORDER — ZOLPIDEM TARTRATE 10 MG PO TABS: 10.0000 mg | ORAL_TABLET | Freq: Every evening | ORAL | 3 refills | Status: DC | PRN

## 2019-03-04 MED ORDER — LEVOTHYROXINE SODIUM 125 MCG PO TABS: 125.0000 ug | ORAL_TABLET | Freq: Every day | ORAL | 3 refills | Status: DC

## 2019-03-04 MED ORDER — LORAZEPAM 1 MG PO TABS: 1.0000 mg | ORAL_TABLET | Freq: Every day | ORAL | 1 refills | Status: DC | PRN

## 2019-03-04 MED ORDER — TAMSULOSIN HCL 0.4 MG PO CAPS: ORAL_CAPSULE | ORAL | 1 refills | Status: DC

## 2019-03-04 NOTE — Patient Instructions (Addendum)
   If you have lab work done today you will be contacted with your lab results within the next 2 weeks.  If you have not heard from us then please contact us. The fastest way to get your results is to register for My Chart.   IF you received an x-ray today, you will receive an invoice from [facility]. Please contact [facility] at [phone] with questions or concerns regarding your invoice.   IF you received labwork today, you will receive an invoice from [facility]. Please contact [facility] at [phone] with questions or concerns regarding your invoice.   Our billing staff will not be able to assist you with questions regarding bills from these companies.  You will be contacted with the lab results as soon as they are available. The fastest way to get your results is to activate your My Chart account. Instructions are located on the last page of this paperwork. If you have not heard from us regarding the results in 2 weeks, please contact this office.     Health Maintenance After Age 65 After age 65, you are at a higher risk for certain long-term diseases and infections as well as injuries from falls. Falls are a major cause of broken bones and head injuries in people who are older than age 65. Getting regular preventive care can help to keep you healthy and well. Preventive care includes getting regular testing and making lifestyle changes as recommended by your health care provider. Talk with your health care provider about:  Which screenings and tests you should have. A screening is a test that checks for a disease when you have no symptoms.  A diet and exercise plan that is right for you. What should I know about screenings and tests to prevent falls? Screening and testing are the best ways to find a health problem early. Early diagnosis and treatment give you the best chance of managing medical conditions that are common after age 65. Certain conditions and  lifestyle choices may make you more likely to have a fall. Your health care provider may recommend:  Regular vision checks. Poor vision and conditions such as cataracts can make you more likely to have a fall. If you wear glasses, make sure to get your prescription updated if your vision changes.  Medicine review. Work with your health care provider to regularly review all of the medicines you are taking, including over-the-counter medicines. Ask your health care provider about any side effects that may make you more likely to have a fall. Tell your health care provider if any medicines that you take make you feel dizzy or sleepy.  Osteoporosis screening. Osteoporosis is a condition that causes the bones to get weaker. This can make the bones weak and cause them to break more easily.  Blood pressure screening. Blood pressure changes and medicines to control blood pressure can make you feel dizzy.  Strength and balance checks. Your health care provider may recommend certain tests to check your strength and balance while standing, walking, or changing positions.  Foot health exam. Foot pain and numbness, as well as not wearing proper footwear, can make you more likely to have a fall.  Depression screening. You may be more likely to have a fall if you have a fear of falling, feel emotionally low, or feel unable to do activities that you used to do.  Alcohol use screening. Using too much alcohol can affect your balance and may make you more likely to   have a fall. What actions can I take to lower my risk of falls? General instructions  Talk with your health care provider about your risks for falling. Tell your health care provider if: ? You fall. Be sure to tell your health care provider about all falls, even ones that seem minor. ? You feel dizzy, sleepy, or off-balance.  Take over-the-counter and prescription medicines only as told by your health care provider. These include any  supplements.  Eat a healthy diet and maintain a healthy weight. A healthy diet includes low-fat dairy products, low-fat (lean) meats, and fiber from whole grains, beans, and lots of fruits and vegetables. Home safety  Remove any tripping hazards, such as rugs, cords, and clutter.  Install safety equipment such as grab bars in bathrooms and safety rails on stairs.  Keep rooms and walkways well-lit. Activity   Follow a regular exercise program to stay fit. This will help you maintain your balance. Ask your health care provider what types of exercise are appropriate for you.  If you need a cane or walker, use it as recommended by your health care provider.  Wear supportive shoes that have nonskid soles. Lifestyle  Do not drink alcohol if your health care provider tells you not to drink.  If you drink alcohol, limit how much you have: ? 0-1 drink a day for women. ? 0-2 drinks a day for men.  Be aware of how much alcohol is in your drink. In the [place] one drink equals one typical bottle of beer (12 oz), one-half glass of wine (5 oz), or one shot of hard liquor (1 oz).  Do not use any products that contain nicotine or tobacco, such as cigarettes and e-cigarettes. If you need help quitting, ask your health care provider. Summary  Having a healthy lifestyle and getting preventive care can help to protect your health and wellness after age 65.  Screening and testing are the best way to find a health problem early and help you avoid having a fall. Early diagnosis and treatment give you the best chance for managing medical conditions that are more common for people who are older than age 65.  Falls are a major cause of broken bones and head injuries in people who are older than age 65. Take precautions to prevent a fall at home.  Work with your health care provider to learn what changes you can make to improve your health and wellness and to prevent falls. This information is not intended  to replace advice given to you by your health care provider. Make sure you discuss any questions you have with your health care provider. Document Released: 02/14/2017 Document Revised: 07/25/2018 Document Reviewed: 02/14/2017 [employer] Patient Education  2020 [employer]  

## 2019-03-04 NOTE — Progress Notes (Signed)
Henry [name] 73 y.o.   Chief Complaint  Patient presents with  . Medication Refill    ALL PENDED    HISTORY OF PRESENT ILLNESS: This is a 73 y.o. male with multiple chronic medical problems here for follow-up and medication refill.  Has the following chronic medical problems: 1.  Hypertension: On Lotensin HCT 20-25 mg daily. 2.  Chronic orthopedic problems/chronic pain: Sees orthopedist regularly.  On no chronic pain medication.  Refuses to take narcotics. 3.  Hypothyroidism: On Synthroid 125 mcg daily. 4.  LUTS secondary to urinary bother cyst.  Has seen urologist in the past. 5.  Status post multiple abdominal surgeries. 6.  Chronic insomnia, Ambien dependent. 7.  Chronic anxiety: Takes lorazepam as needed. No other complaints or medical concerns today.  HPI   Prior to Admission medications   Medication Sig Start Date End Date Taking? Authorizing Provider  benazepril-hydrochlorthiazide (LOTENSIN HCT) 20-25 MG tablet TAKE 1 TABLET BY MOUTH EVERY DAY 12/25/18  Yes Jazma Pickel, Ines Bloomer, MD  cholecalciferol (VITAMIN D) 1000 UNITS tablet Take 1,000 Units by mouth daily.   Yes [provider]  diclofenac sodium (VOLTAREN) 1 % GEL Apply 4 g topically 4 (four) times daily. 04/06/17  Yes Jessy Oto, MD  fish oil-omega-3 fatty acids 1000 MG capsule Take 2 g by mouth daily.   Yes [provider]  levothyroxine (SYNTHROID) 125 MCG tablet TAKE 1 TABLET BY MOUTH EVERY DAY BEFORE BREAKFAST 11/26/18  Yes Jayda White, Ines Bloomer, MD  LORazepam (ATIVAN) 1 MG tablet TAKE 1 TABLET BY MOUTH EVERY DAY AS NEEDED FOR ANXIETY 05/27/18  Yes Gudrun Axe, Ines Bloomer, MD  tamsulosin (FLOMAX) 0.4 MG CAPS capsule TAKE 1 CAPSULE BY MOUTH DAILY AFTER BREAKFAST. 11/26/18  Yes Pershing Skidmore, Ines Bloomer, MD  zolpidem (AMBIEN) 10 MG tablet Take 1 tablet (10 mg total) by mouth at bedtime as needed for sleep. Should not be taken every day. 02/28/19  Yes Kellan Boehlke, Ines Bloomer, MD  nabumetone (RELAFEN) 500  MG tablet Take 1 tablet (500 mg total) by mouth 2 (two) times daily. Patient not taking: Reported on 03/04/2019 05/22/18   Jessy Oto, MD  traMADol (ULTRAM) 50 MG tablet Take 1 tablet (50 mg total) by mouth every 6 (six) hours as needed for up to 7 days. 05/22/18 05/29/18  Jessy Oto, MD    Allergies  Allergen Reactions  . Ciprofloxacin Itching and Rash    Patient Active Problem List   Diagnosis Date Noted  . Irritable bowel syndrome with diarrhea 09/07/2016  . Iron deficiency anemia due to chronic blood loss 09/07/2016  . Hypothyroidism due to acquired atrophy of thyroid 08/15/2016  . Degenerative disc disease, lumbar 03/16/2014  . Insomnia 03/16/2014  . Chronic pain syndrome 03/16/2014  . ARTERIOVENOUS MALFORMATION 07/16/2009  . Essential hypertension 07/19/2007    Past Medical History:  Diagnosis Date  . Anxiety   . Arthritis   . Degenerative disc disease, lumbar   . Hypertension   . Hypothyroidism   . 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 CHOLECYSTECTOMY    . COLON SURGERY  08/07/2006   colostomy & small bowel resection  . FRACTURE SURGERY     left shoulder  . SPINE SURGERY    . 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    Social History   Socioeconomic History  . Marital status: Married    Spouse name: Not on file  .  Number of children: Not on file  . Years of education: Not on file  . Highest education level: Not on file  Occupational History  . Not on file  Social Needs  . Financial resource strain: Not on file  . Food insecurity    Worry: Not on file    Inability: Not on file  . Transportation needs    Medical: Not on file    Non-medical: Not on file  Tobacco Use  . Smoking status: Never Smoker  . Smokeless tobacco: Never Used  Substance and Sexual Activity  . Alcohol use: Not on file  . Drug use: Not on file  . Sexual activity: Yes  Lifestyle  . Physical  activity    Days per week: Not on file    Minutes per session: Not on file  . Stress: Not on file  Relationships  . Social Herbalist on phone: Not on file    Gets together: Not on file    Attends religious service: Not on file    Active member of club or organization: Not on file    Attends meetings of clubs or organizations: Not on file    Relationship status: Not on file  . Intimate partner violence    Fear of current or ex partner: Not on file    Emotionally abused: Not on file    Physically abused: Not on file    Forced sexual activity: Not on file  Other Topics Concern  . Not on file  Social History Narrative   Marital status: married x 49 years      Children: 2 children/sons; 2 grandchildren      Lives: with wife      Employment: retired; stays busy      Tobacco: none      Alcohol: 3 alcoholic drinks on weekends      Exercise: stays very active; was swimming but stopped in 2017.  Walking.      Advanced Directives: YES; FULL CODE; no prolonged measures.    Family History  Problem Relation Age of Onset  . Heart disease Mother        CAD and CHF     Review of Systems  Constitutional: Negative.  Negative for chills and fever.  HENT: Negative.  Negative for sore throat.   Eyes: Negative.   Respiratory: Negative.  Negative for shortness of breath.   Cardiovascular: Negative.  Negative for chest pain and palpitations.  Gastrointestinal: Negative.  Negative for abdominal pain, diarrhea, nausea and vomiting.  Genitourinary: Negative.  Negative for dysuria and hematuria.  Musculoskeletal: Negative.  Negative for back pain, myalgias and neck pain.  Skin: Negative.  Negative for rash.  Neurological: Negative.  Negative for dizziness and headaches.  All other systems reviewed and are negative.  Vitals:   03/04/19 1113  BP: 114/67  Pulse: 80  Resp: 16  Temp: 97.9 F (36.6 C)  SpO2: 96%     Physical Exam Vitals signs reviewed.  Constitutional:       Appearance: Normal appearance.  Eyes:     Extraocular Movements: Extraocular movements intact.     Conjunctiva/sclera: Conjunctivae normal.     Pupils: Pupils are equal, round, and reactive to light.  Neck:     Musculoskeletal: Normal range of motion.  Cardiovascular:     Rate and Rhythm: Normal rate and regular rhythm.     Pulses: Normal pulses.     Heart sounds: Normal heart sounds.  Musculoskeletal: Normal  range of motion.  Skin:    General: Skin is warm and dry.     Capillary Refill: Capillary refill takes less than 2 seconds.  Neurological:     General: No focal deficit present.     Mental Status: He is alert and oriented to person, place, and time.  Psychiatric:        Mood and Affect: Mood normal.        Behavior: Behavior normal.    A total of 25 minutes was spent in the room with the patient, greater than 50% of which was in counseling/coordination of care regarding chronic medical problems, management, medication side effects, diet and nutrition, chronic pain management, prognosis and need for follow-up.   ASSESSMENT & PLAN: Henry [name] was seen today for medication refill.  Diagnoses and all orders for this visit:  Essential hypertension -     benazepril-hydrochlorthiazide (LOTENSIN HCT) 20-25 MG tablet; Take 1 tablet by mouth daily.  Psychophysiological insomnia -     zolpidem (AMBIEN) 10 MG tablet; Take 1 tablet (10 mg total) by mouth at bedtime as needed for sleep. Should not be taken every day.  Lower urinary tract symptoms (LUTS) -     tamsulosin (FLOMAX) 0.4 MG CAPS capsule; TAKE 1 CAPSULE BY MOUTH DAILY AFTER BREAKFAST.  Anxiety state -     LORazepam (ATIVAN) 1 MG tablet; Take 1 tablet (1 mg total) by mouth daily as needed for anxiety.  Hypothyroidism due to acquired atrophy of thyroid -     levothyroxine (SYNTHROID) 125 MCG tablet; Take 1 tablet (125 mcg total) by mouth daily before breakfast.    Patient Instructions       If you have lab work done  today you will be contacted with your lab results within the next 2 weeks.  If you have not heard from [place] then please contact us. The fastest way to get your results is to register for My Chart.   IF you received an x-ray today, you will receive an invoice from [facility]. Please contact [facility] at [phone] with questions or concerns regarding your invoice.   IF you received labwork today, you will receive an invoice from [facility]. Please contact [facility] at [phone] with questions or concerns regarding your invoice.   Our billing staff will not be able to assist you with questions regarding bills from these companies.  You will be contacted with the lab results as soon as they are available. The fastest way to get your results is to activate your My Chart account. Instructions are located on the last page of this paperwork. If you have not heard from [place] regarding the results in 2 weeks, please contact this office.     Health Maintenance After Age 34 After age 68, you are at a higher risk for certain long-term diseases and infections as well as injuries from falls. Falls are a major cause of broken bones and head injuries in people who are older than age 61. Getting regular preventive care can help to keep you healthy and well. Preventive care includes getting regular testing and making lifestyle changes as recommended by your health care provider. Talk with your health care provider about:  Which screenings and tests you should have. A screening is a test that checks for a disease when you have no symptoms.  A diet and exercise plan that is right for you. What should I know about screenings and tests to prevent falls? Screening and testing are the best ways  to find a health problem early. Early diagnosis and treatment give you the best chance of managing medical conditions that are common after age 15. Certain conditions and lifestyle choices may make you more  likely to have a fall. Your health care provider may recommend:  Regular vision checks. Poor vision and conditions such as cataracts can make you more likely to have a fall. If you wear glasses, make sure to get your prescription updated if your vision changes.  Medicine review. Work with your health care provider to regularly review all of the medicines you are taking, including over-the-counter medicines. Ask your health care provider about any side effects that may make you more likely to have a fall. Tell your health care provider if any medicines that you take make you feel dizzy or sleepy.  Osteoporosis screening. Osteoporosis is a condition that causes the bones to get weaker. This can make the bones weak and cause them to break more easily.  Blood pressure screening. Blood pressure changes and medicines to control blood pressure can make you feel dizzy.  Strength and balance checks. Your health care provider may recommend certain tests to check your strength and balance while standing, walking, or changing positions.  Foot health exam. Foot pain and numbness, as well as not wearing proper footwear, can make you more likely to have a fall.  Depression screening. You may be more likely to have a fall if you have a fear of falling, feel emotionally low, or feel unable to do activities that you used to do.  Alcohol use screening. Using too much alcohol can affect your balance and may make you more likely to have a fall. What actions can I take to lower my risk of falls? General instructions  Talk with your health care provider about your risks for falling. Tell your health care provider if: ? You fall. Be sure to tell your health care provider about all falls, even ones that seem minor. ? You feel dizzy, sleepy, or off-balance.  Take over-the-counter and prescription medicines only as told by your health care provider. These include any supplements.  Eat a healthy diet and maintain a  healthy weight. A healthy diet includes low-fat dairy products, low-fat (lean) meats, and fiber from whole grains, beans, and lots of fruits and vegetables. Home safety  Remove any tripping hazards, such as rugs, cords, and clutter.  Install safety equipment such as grab bars in bathrooms and safety rails on stairs.  Keep rooms and walkways well-lit. Activity   Follow a regular exercise program to stay fit. This will help you maintain your balance. Ask your health care provider what types of exercise are appropriate for you.  If you need a cane or walker, use it as recommended by your health care provider.  Wear supportive shoes that have nonskid soles. Lifestyle  Do not drink alcohol if your health care provider tells you not to drink.  If you drink alcohol, limit how much you have: ? 0-1 drink a day for women. ? 0-2 drinks a day for men.  Be aware of how much alcohol is in your drink. In the [place] one drink equals one typical bottle of beer (12 oz), one-half glass of wine (5 oz), or one shot of hard liquor (1 oz).  Do not use any products that contain nicotine or tobacco, such as cigarettes and e-cigarettes. If you need help quitting, ask your health care provider. Summary  Having a healthy lifestyle and getting preventive care  can help to protect your health and wellness after age 107.  Screening and testing are the best way to find a health problem early and help you avoid having a fall. Early diagnosis and treatment give you the best chance for managing medical conditions that are more common for people who are older than age 55.  Falls are a major cause of broken bones and head injuries in people who are older than age 48. Take precautions to prevent a fall at home.  Work with your health care provider to learn what changes you can make to improve your health and wellness and to prevent falls. This information is not intended to replace advice given to you by your health  care provider. Make sure you discuss any questions you have with your health care provider. Document Released: 02/14/2017 Document Revised: 07/25/2018 Document Reviewed: 02/14/2017 Elsevier Patient Education  2020 Elsevier Inc.      Agustina Caroli, MD [facility]

## 2019-03-06 ENCOUNTER — Telehealth: Payer: Self-pay | Admitting: Emergency Medicine

## 2019-03-06 NOTE — Telephone Encounter (Signed)
Medication Zolpidem Tartrate was approved by the insurance company for 04/18/2019 to 04/16/2022, as long as he remains with the same health insurance.

## 2019-06-02 ENCOUNTER — Other Ambulatory Visit: Payer: Self-pay | Admitting: Emergency Medicine

## 2019-06-02 ENCOUNTER — Telehealth: Payer: Self-pay | Admitting: *Deleted

## 2019-06-02 DIAGNOSIS — F5104 Psychophysiologic insomnia: Secondary | ICD-10-CM

## 2019-06-02 DIAGNOSIS — F411 Generalized anxiety disorder: Secondary | ICD-10-CM

## 2019-06-02 MED ORDER — ZOLPIDEM TARTRATE 10 MG PO TABS: 10.0000 mg | ORAL_TABLET | Freq: Every evening | ORAL | 3 refills | Status: DC | PRN

## 2019-06-02 MED ORDER — LORAZEPAM 1 MG PO TABS: 1.0000 mg | ORAL_TABLET | Freq: Every day | ORAL | 1 refills | Status: DC | PRN

## 2019-06-02 NOTE — Telephone Encounter (Signed)
Mr Seymour would like a refill of   Ambien   Last refill 03/04/2019  3 refills. He just picked up one for February and pharmacy told him he had no more refills.  Ativan 03/04/2019  1 refill  Patient has a follow up appointment for medications  09/02/2019

## 2019-06-02 NOTE — Telephone Encounter (Signed)
Refills sent. Thanks! 

## 2019-06-05 ENCOUNTER — Ambulatory Visit: Payer: Self-pay

## 2019-06-11 ENCOUNTER — Ambulatory Visit (INDEPENDENT_AMBULATORY_CARE_PROVIDER_SITE_OTHER): Payer: Medicare HMO | Admitting: Emergency Medicine

## 2019-06-11 ENCOUNTER — Telehealth: Payer: Self-pay | Admitting: *Deleted

## 2019-06-11 ENCOUNTER — Other Ambulatory Visit: Payer: Self-pay | Admitting: Emergency Medicine

## 2019-06-11 VITALS — BP 114/67 | Ht 66.0 in | Wt 191.0 lb

## 2019-06-11 DIAGNOSIS — E7800 Pure hypercholesterolemia, unspecified: Secondary | ICD-10-CM | POA: Diagnosis not present

## 2019-06-11 DIAGNOSIS — I10 Essential (primary) hypertension: Secondary | ICD-10-CM

## 2019-06-11 DIAGNOSIS — E034 Atrophy of thyroid (acquired): Secondary | ICD-10-CM

## 2019-06-11 DIAGNOSIS — D500 Iron deficiency anemia secondary to blood loss (chronic): Secondary | ICD-10-CM

## 2019-06-11 DIAGNOSIS — Z0001 Encounter for general adult medical examination with abnormal findings: Secondary | ICD-10-CM | POA: Diagnosis not present

## 2019-06-11 DIAGNOSIS — Z0000 Encounter for general adult medical examination without abnormal findings: Secondary | ICD-10-CM

## 2019-06-11 NOTE — Addendum Note (Signed)
Addended by: Alfredia Ferguson A on: 06/11/2019 11:24 AM   Modules accepted: Orders

## 2019-06-11 NOTE — Patient Instructions (Addendum)
Thank you for taking time to come for your Medicare Wellness Visit. I appreciate your ongoing commitment to your health goals. Please review the following plan we discussed and let me know if I can assist you in the future.  Leroy Kennedy LPN  Preventive Care 72 Years and Older, Male Preventive care refers to lifestyle choices and visits with your health care provider that can promote health and wellness. This includes:  A yearly physical exam. This is also called an annual well check.  Regular dental and eye exams.  Immunizations.  Screening for certain conditions.  Healthy lifestyle choices, such as diet and exercise. What can I expect for my preventive care visit? Physical exam Your health care provider will check:  Height and weight. These may be used to calculate body mass index (BMI), which is a measurement that tells if you are at a healthy weight.  Heart rate and blood pressure.  Your skin for abnormal spots. Counseling Your health care provider may ask you questions about:  Alcohol, tobacco, and drug use.  Emotional well-being.  Home and relationship well-being.  Sexual activity.  Eating habits.  History of falls.  Memory and ability to understand (cognition).  Work and work Statistician. What immunizations do I need?  Influenza (flu) vaccine  This is recommended every year. Tetanus, diphtheria, and pertussis (Tdap) vaccine  You may need a Td booster every 10 years. Varicella (chickenpox) vaccine  You may need this vaccine if you have not already been vaccinated. Zoster (shingles) vaccine  You may need this after age 66. Pneumococcal conjugate (PCV13) vaccine  One dose is recommended after age 84. Pneumococcal polysaccharide (PPSV23) vaccine  One dose is recommended after age 88. Measles, mumps, and rubella (MMR) vaccine  You may need at least one dose of MMR if you were born in 1957 or later. You may also need a second dose. Meningococcal  conjugate (MenACWY) vaccine  You may need this if you have certain conditions. Hepatitis A vaccine  You may need this if you have certain conditions or if you travel or work in places where you may be exposed to hepatitis A. Hepatitis B vaccine  You may need this if you have certain conditions or if you travel or work in places where you may be exposed to hepatitis B. Haemophilus influenzae type b (Hib) vaccine  You may need this if you have certain conditions. You may receive vaccines as individual doses or as more than one vaccine together in one shot (combination vaccines). Talk with your health care provider about the risks and benefits of combination vaccines. What tests do I need? Blood tests  Lipid and cholesterol levels. These may be checked every 5 years, or more frequently depending on your overall health.  Hepatitis C test.  Hepatitis B test. Screening  Lung cancer screening. You may have this screening every year starting at age 24 if you have a 30-pack-year history of smoking and currently smoke or have quit within the past 15 years.  Colorectal cancer screening. All adults should have this screening starting at age 52 and continuing until age 61. Your health care provider may recommend screening at age 57 if you are at increased risk. You will have tests every 1-10 years, depending on your results and the type of screening test.  Prostate cancer screening. Recommendations will vary depending on your family history and other risks.  Diabetes screening. This is done by checking your blood sugar (glucose) after you have not eaten for  a while (fasting). You may have this done every 1-3 years.  Abdominal aortic aneurysm (AAA) screening. You may need this if you are a current or former smoker.  Sexually transmitted disease (STD) testing. Follow these instructions at home: Eating and drinking  Eat a diet that includes fresh fruits and vegetables, whole grains, lean  protein, and low-fat dairy products. Limit your intake of foods with high amounts of sugar, saturated fats, and salt.  Take vitamin and mineral supplements as recommended by your health care provider.  Do not drink alcohol if your health care provider tells you not to drink.  If you drink alcohol: ? Limit how much you have to 0-2 drinks a day. ? Be aware of how much alcohol is in your drink. In the [place] one drink equals one 12 oz bottle of beer (355 mL), one 5 oz glass of wine (148 mL), or one 1 oz glass of hard liquor (44 mL). Lifestyle  Take daily care of your teeth and gums.  Stay active. Exercise for at least 30 minutes on 5 or more days each week.  Do not use any products that contain nicotine or tobacco, such as cigarettes, e-cigarettes, and chewing tobacco. If you need help quitting, ask your health care provider.  If you are sexually active, practice safe sex. Use a condom or other form of protection to prevent STIs (sexually transmitted infections).  Talk with your health care provider about taking a low-dose aspirin or statin. What's next?  Visit your health care provider once a year for a well check visit.  Ask your health care provider how often you should have your eyes and teeth checked.  Stay up to date on all vaccines. This information is not intended to replace advice given to you by your health care provider. Make sure you discuss any questions you have with your health care provider. Document Revised: 03/28/2018 Document Reviewed: 03/28/2018 Elsevier Patient Education  2020 Elsevier Inc.  

## 2019-06-11 NOTE — Progress Notes (Signed)
Presents today for [employer] Visit   Date of last exam: 03/04/2019  Interpreter used for this visit?  No  I connected with  Henry [name] on 06/11/19  By telephone  and verified that I am speaking with the correct person using two identifiers.   I discussed the limitations of evaluation and management by telemedicine. The patient expressed understanding and agreed to proceed.    Patient Care Team: Horald Pollen, MD as PCP - General (Internal Medicine)   Other items to address today:   Discussed immunizations Discussed Eye/Dental Follow up scheduled 09-02-2018 @5 :20 Dr. Mitchel Honour   Other Screening: Last screening for diabetes: 01/16/2017 Last lipid screening: 07/31/2018  ADVANCE DIRECTIVES: Discussed: YES On File: NO Materials Provided NO  Immunization status:  Immunization History  Administered Date(s) Administered  . Td 04/17/2004  . Zoster 04/11/2018     There are no preventive care reminders to display for this patient.   Functional Status Survey: Is the patient deaf or have difficulty hearing?: Yes(no hearing.) Does the patient have difficulty seeing, even when wearing glasses/contacts?: No Does the patient have difficulty concentrating, remembering, or making decisions?: No Does the patient have difficulty walking or climbing stairs?: No Does the patient have difficulty dressing or bathing?: No Does the patient have difficulty doing errands alone such as visiting a doctor's office or shopping?: No   6CIT Screen 06/11/2019 06/11/2019  What Year? 0 points 0 points  What month? 0 points 0 points  What time? 0 points 0 points  Count back from 20 0 points 0 points  Months in reverse 0 points 0 points  Repeat phrase 0 points 0 points  Total Score 0 0        Clinical Support from 06/11/2019 in [facility]  AUDIT-C Score  4       Home Environment:   Marital status: married x 49 years    Children: 2  children/sons; 2 grandchildren    Lives: with wife    Employment: retired; stays busy    Lives in one story home Yes trouble climbing stairs knee and ankle pain No scattered rugs Yes grab bar  Adequate lighting/ no clutter  Timed warm up N/A    Patient Active Problem List   Diagnosis Date Noted  . Irritable bowel syndrome with diarrhea 09/07/2016  . Iron deficiency anemia due to chronic blood loss 09/07/2016  . Hypothyroidism due to acquired atrophy of thyroid 08/15/2016  . Degenerative disc disease, lumbar 03/16/2014  . Insomnia 03/16/2014  . Chronic pain syndrome 03/16/2014  . ARTERIOVENOUS MALFORMATION 07/16/2009  . Essential hypertension 07/19/2007     Past Medical History:  Diagnosis Date  . Anxiety   . Arthritis   . Degenerative disc disease, lumbar   . Hypertension   . Hypothyroidism   . 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 CHOLECYSTECTOMY    . COLON SURGERY  08/07/2006   colostomy & small bowel resection  . FRACTURE SURGERY     left shoulder  . SPINE SURGERY    . 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     Family History  Problem Relation Age of Onset  . Heart disease Mother        CAD and CHF     Social History   Socioeconomic History  . Marital status: Married    Spouse name: Not on  file  . Number of children: Not on file  . Years of education: Not on file  . Highest education level: Not on file  Occupational History  . Not on file  Tobacco Use  . Smoking status: Never Smoker  . Smokeless tobacco: Never Used  Substance and Sexual Activity  . Alcohol use: Not on file  . Drug use: Not on file  . Sexual activity: Yes  Other Topics Concern  . Not on file  Social History Narrative  . Not on file   Social Determinants of Health   Financial Resource Strain:   . Difficulty of Paying Living Expenses: Not on file  Food Insecurity:   . Worried  About Charity fundraiser in the Last Year: Not on file  . Ran Out of Food in the Last Year: Not on file  Transportation Needs:   . Lack of Transportation (Medical): Not on file  . Lack of Transportation (Non-Medical): Not on file  Physical Activity:   . Days of Exercise per Week: Not on file  . Minutes of Exercise per Session: Not on file  Stress:   . Feeling of Stress : Not on file  Social Connections:   . Frequency of Communication with Friends and Family: Not on file  . Frequency of Social Gatherings with Friends and Family: Not on file  . Attends Religious Services: Not on file  . Active Member of Clubs or Organizations: Not on file  . Attends Archivist Meetings: Not on file  . Marital Status: Not on file  Intimate Partner Violence:   . Fear of Current or Ex-Partner: Not on file  . Emotionally Abused: Not on file  . Physically Abused: Not on file  . Sexually Abused: Not on file     Allergies  Allergen Reactions  . Ciprofloxacin Itching and Rash     Prior to Admission medications   Medication Sig Start Date End Date Taking? Authorizing Provider  benazepril-hydrochlorthiazide (LOTENSIN HCT) 20-25 MG tablet Take 1 tablet by mouth daily. 03/04/19  Yes Sagardia, Ines Bloomer, MD  cholecalciferol (VITAMIN D) 1000 UNITS tablet Take 1,000 Units by mouth daily.   Yes [provider]  fish oil-omega-3 fatty acids 1000 MG capsule Take 2 g by mouth daily.   Yes [provider]  levothyroxine (SYNTHROID) 125 MCG tablet Take 1 tablet (125 mcg total) by mouth daily before breakfast. 03/04/19 06/11/19 Yes Sagardia, Ines Bloomer, MD  LORazepam (ATIVAN) 1 MG tablet Take 1 tablet (1 mg total) by mouth daily as needed for anxiety. 06/02/19  Yes Sagardia, Ines Bloomer, MD  tamsulosin (FLOMAX) 0.4 MG CAPS capsule TAKE 1 CAPSULE BY MOUTH DAILY AFTER BREAKFAST. 03/04/19  Yes Sagardia, Ines Bloomer, MD  zolpidem (AMBIEN) 10 MG tablet Take 1 tablet (10 mg total) by mouth at  bedtime as needed for sleep. Should not be taken every day. 06/02/19  Yes Sagardia, Ines Bloomer, MD  diclofenac sodium (VOLTAREN) 1 % GEL Apply 4 g topically 4 (four) times daily. Patient not taking: Reported on 06/11/2019 04/06/17   Jessy Oto, MD  nabumetone (RELAFEN) 500 MG tablet Take 1 tablet (500 mg total) by mouth 2 (two) times daily. Patient not taking: Reported on 03/04/2019 05/22/18   Jessy Oto, MD  traMADol (ULTRAM) 50 MG tablet Take 1 tablet (50 mg total) by mouth every 6 (six) hours as needed for up to 7 days. 05/22/18 05/29/18  Jessy Oto, MD     Depression screen PHQ  2/9 06/11/2019 03/04/2019 10/22/2018 07/17/2018 01/23/2018  Decreased Interest 0 0 0 0 0  Down, Depressed, Hopeless 0 0 0 0 0  PHQ - 2 Score 0 0 0 0 0  Altered sleeping - - - - -  Tired, decreased energy - - - - -  Change in appetite - - - - -  Feeling bad or failure about yourself  - - - - -  Trouble concentrating - - - - -  Moving slowly or fidgety/restless - - - - -  Suicidal thoughts - - - - -  PHQ-9 Score - - - - -  Difficult doing work/chores - - - - -     Fall Risk  06/11/2019 03/04/2019 10/22/2018 07/17/2018 01/23/2018  Falls in the past year? 0 0 0 0 No  Number falls in past yr: 0 - - - -  Injury with Fall? 0 - - - -  Follow up Falls evaluation completed;Education provided Falls evaluation completed Falls evaluation completed - -      PHYSICAL EXAM: BP 114/67 Comment: NOT IN CLINIC  Ht 5\' 6"  (1.676 m)   Wt 191 lb (86.6 kg)   BMI 30.83 kg/m    Wt Readings from Last 3 Encounters:  06/11/19 191 lb (86.6 kg)  03/04/19 191 lb 3.2 oz (86.7 kg)  10/22/18 185 lb (83.9 kg)       Education/Counseling provided regarding diet and exercise, prevention of chronic diseases, smoking/tobacco cessation, if applicable, and reviewed "Covered Medicare Preventive Services."

## 2019-06-11 NOTE — Telephone Encounter (Signed)
Iron panel added.  Thanks.

## 2019-06-11 NOTE — Telephone Encounter (Signed)
Patient would like to come in last week in April to have his Lab work done for appointment on 09-02-2018 this visit is schedule as a TELEMED visit.    He states he needs an iron panel added to his labs.  States he gets every year.  Last one was done 07-31-2018.

## 2019-08-07 ENCOUNTER — Ambulatory Visit: Payer: Self-pay

## 2019-08-07 ENCOUNTER — Ambulatory Visit (INDEPENDENT_AMBULATORY_CARE_PROVIDER_SITE_OTHER): Payer: Medicare HMO

## 2019-08-07 ENCOUNTER — Other Ambulatory Visit: Payer: Self-pay

## 2019-08-07 ENCOUNTER — Encounter: Payer: Self-pay | Admitting: Specialist

## 2019-08-07 ENCOUNTER — Ambulatory Visit (INDEPENDENT_AMBULATORY_CARE_PROVIDER_SITE_OTHER): Payer: Medicare HMO | Admitting: Specialist

## 2019-08-07 VITALS — BP 118/69 | HR 74 | Ht 66.0 in | Wt 191.0 lb

## 2019-08-07 DIAGNOSIS — M19012 Primary osteoarthritis, left shoulder: Secondary | ICD-10-CM | POA: Diagnosis not present

## 2019-08-07 DIAGNOSIS — M4014 Other secondary kyphosis, thoracic region: Secondary | ICD-10-CM

## 2019-08-07 DIAGNOSIS — M19112 Post-traumatic osteoarthritis, left shoulder: Secondary | ICD-10-CM

## 2019-08-07 DIAGNOSIS — M542 Cervicalgia: Secondary | ICD-10-CM

## 2019-08-07 DIAGNOSIS — M5442 Lumbago with sciatica, left side: Secondary | ICD-10-CM

## 2019-08-07 NOTE — Progress Notes (Signed)
Office Visit Note   Patient: Henry [name]           Date of Birth: 1946/04/02           MRN: [id number] Visit Date: 08/07/2019              Requested by: Horald Pollen, [place],   [id number] PCP: Horald Pollen, MD   Assessment & Plan: Visit Diagnoses:  1. Primary osteoarthritis, left shoulder   2. Bilateral low back pain with left-sided sciatica, unspecified chronicity   3. Other secondary kyphosis, thoracic region   4. Post-traumatic osteoarthritis of left shoulder   5. Cervicalgia     Plan: Avoid overhead lifting and overhead use of the arms. Pillows to keep from sleeping directly on the shoulders Limited lifting to less than 10 lbs. Ice or heat for relief. NSAIDs are helpful, such as alleve or motrin, be careful not to use in excess as they place burdens on the kidney. Stretching exercise help and strengthening is helpful to build endurance. See Dr Marlou Sa to consider left shoulder arthroplasty.  Follow-Up Instructions: Return in about 4 weeks (around 09/04/2019).   Orders:  Orders Placed This Encounter  Procedures  . XR Shoulder Left  . XR Lumbar Spine 2-3 Views   No orders of the defined types were placed in this encounter.     Procedures: No procedures performed   Clinical Data: No additional findings.   Subjective: Chief Complaint  Patient presents with  . Left Shoulder - Pain    74 year old male with history of left shoulder fracture years ago, greater than 30 years ago, treated by Dr. Collie Siad with ORIF, he went on to develop severe osteoarthritis in the left shoulder. Has had intermittant injections every couple years with improvement and he is having increasing pain and difficulty sleeping. Last injected about one year ago.  He also is having pain in the back with pain in the buttocks and thighs pain is worse with standing and walking. There is burning sensation and buttocks and thigh weakness. There is a right  ankle replacement 2014 Dr. Clair Gulling at [facility]. He has some bladder and bowel difficulty with constipation and IBS like symptoms. Had a partial bowel resection for perforation in 2008. Problems in 2007 started had gallbladder resected and the had colon perforation due to diverticulitis, colostomy and reversed.    Review of Systems  Constitutional: Negative.  Negative for activity change, appetite change, chills, diaphoresis, fatigue, fever and unexpected weight change.  HENT: Negative.  Negative for congestion, dental problem, drooling, ear discharge, ear pain, facial swelling, hearing loss, mouth sores, nosebleeds, postnasal drip, rhinorrhea, sinus pressure, sinus pain, sneezing, sore throat, tinnitus, trouble swallowing and voice change.   Eyes: Negative.  Negative for photophobia, pain, discharge, redness, itching and visual disturbance.  Respiratory: Negative.  Negative for apnea, cough, choking, chest tightness, shortness of breath, wheezing and stridor.   Cardiovascular: Negative.  Negative for chest pain, palpitations and leg swelling.  Gastrointestinal:  Positive for constipation. Negative for abdominal distention, abdominal pain, anal bleeding, blood in stool, diarrhea, nausea and rectal pain.  Endocrine: Negative.  Negative for cold intolerance, heat intolerance, polydipsia, polyphagia and polyuria.  Genitourinary: Negative.  Negative for decreased urine volume, difficulty urinating, dysuria, enuresis, flank pain, frequency, genital sores, hematuria, penile discharge, penile pain, penile swelling, scrotal swelling, testicular pain and urgency.  Musculoskeletal:  Positive for back pain and gait problem. Negative for arthralgias, joint swelling, myalgias,  neck pain and neck stiffness.  Skin: Negative.  Negative for color change, pallor, rash and wound.  Allergic/Immunologic: Negative.  Negative for environmental allergies, food allergies and immunocompromised state.  Neurological:  Negative for  dizziness, tremors, seizures, syncope, facial asymmetry, speech difficulty, weakness, light-headedness, numbness and headaches.  Hematological: Negative.  Negative for adenopathy. Does not bruise/bleed easily.  Psychiatric/Behavioral: Negative.  Negative for agitation, behavioral problems, confusion, decreased concentration, dysphoric mood, hallucinations, self-injury, sleep disturbance and suicidal ideas. The patient is not nervous/anxious and is not hyperactive.      Objective: Vital Signs: BP 118/69 (BP Location: Right Arm, Patient Position: Sitting)   Pulse 74   Ht 5\' 6"  (1.676 m)   Wt 191 lb (86.6 kg)   BMI 30.83 kg/m   Physical Exam Constitutional:      Appearance: He is well-developed.  HENT:     Head: Normocephalic and atraumatic.  Eyes:     Pupils: Pupils are equal, round, and reactive to light.  Pulmonary:     Effort: Pulmonary effort is normal.     Breath sounds: Normal breath sounds.  Abdominal:     General: Bowel sounds are normal.     Palpations: Abdomen is soft.  Musculoskeletal:     Cervical back: Normal range of motion and neck supple.  Skin:    General: Skin is warm and dry.  Neurological:     Mental Status: He is alert and oriented to person, place, and time.  Psychiatric:        Behavior: Behavior normal.        Thought Content: Thought content normal.        Judgment: Judgment normal.   Left Shoulder Exam   Tenderness  The patient is experiencing tenderness in the acromioclavicular joint and acromion.  Range of Motion  Active abduction:  30 abnormal  Passive abduction:  40  Extension:  20  External rotation:  30  Forward flexion:  40  Internal rotation 0 degrees:  abnormal  Internal rotation 90 degrees:  abnormal   Muscle Strength  Abduction: 4/5  Internal rotation: 4/5  External rotation: 4/5  Supraspinatus: 4/5  Subscapularis: 4/5  Biceps: 5/5   Tests  Apprehension: positive Cross arm: positive Impingement: positive Drop arm:  negative Sulcus: present  Other  Erythema: absent Scars: present Sensation: normal Pulse: present   Comments:  Grating and bone crepitus with ROM left shoulder.     Specialty Comments:  No specialty comments available.  Imaging: No results found.    PMFS History: Patient Active Problem List   Diagnosis Date Noted  . Current moderate episode of major depressive disorder without prior episode ([place]) 09/07/2021  . Irritable bowel syndrome with diarrhea 09/07/2016  . Iron deficiency anemia due to chronic blood loss 09/07/2016  . Hypothyroidism due to acquired atrophy of thyroid 08/15/2016  . Degenerative disc disease, lumbar 03/16/2014  . Insomnia 03/16/2014  . Chronic pain syndrome 03/16/2014  . Hypothyroidism 05/02/2012  . 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  . ARTERIOVENOUS MALFORMATION 07/16/2009  . Essential hypertension 07/19/2007   Past Medical History:  Diagnosis Date  . Anxiety   . Arthritis   . Degenerative disc disease, lumbar   . Hypertension   . Hypothyroidism   . 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 Heart disease Mother        CAD and CHF    Past  Surgical History:  Procedure Laterality Date  . CHOLECYSTECTOMY    . COLON SURGERY  08/07/2006   colostomy & small bowel resection  . FRACTURE SURGERY     left shoulder  . SPINE SURGERY    . 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 Not on file  Tobacco Use  . Smoking status: Never  . Smokeless tobacco: Never  Substance and Sexual Activity  . Alcohol use: Not on file  . Drug use: Not on file  . Sexual activity: Yes

## 2019-08-07 NOTE — Patient Instructions (Signed)
Avoid overhead lifting and overhead use of the arms. Pillows to keep from sleeping directly on the shoulders Limited lifting to less than 10 lbs. Ice or heat for relief. NSAIDs are helpful, such as alleve or motrin, be careful not to use in excess as they place burdens on the kidney. Stretching exercise help and strengthening is helpful to build endurance. See Dr Marlou Sa to consider left shoulder arthroplasty.

## 2019-08-08 ENCOUNTER — Telehealth: Payer: Self-pay

## 2019-08-08 NOTE — Telephone Encounter (Signed)
Phone call to patient to verify medication list and allergies for myelogram procedure. All medications pt is currently taking are safe to continue. 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 Pt verbalized understanding and has our contact information if he were to need it.

## 2019-08-19 ENCOUNTER — Inpatient Hospital Stay: Admission: RE | Admit: 2019-08-19 | Payer: Medicare HMO | Source: Ambulatory Visit

## 2019-08-19 ENCOUNTER — Other Ambulatory Visit: Payer: Medicare HMO

## 2019-09-02 ENCOUNTER — Other Ambulatory Visit: Payer: Self-pay

## 2019-09-02 ENCOUNTER — Telehealth (INDEPENDENT_AMBULATORY_CARE_PROVIDER_SITE_OTHER): Payer: Medicare HMO | Admitting: Emergency Medicine

## 2019-09-02 ENCOUNTER — Encounter: Payer: Self-pay | Admitting: Emergency Medicine

## 2019-09-02 DIAGNOSIS — R399 Unspecified symptoms and signs involving the genitourinary system: Secondary | ICD-10-CM

## 2019-09-02 DIAGNOSIS — I10 Essential (primary) hypertension: Secondary | ICD-10-CM | POA: Diagnosis not present

## 2019-09-02 DIAGNOSIS — R69 Illness, unspecified: Secondary | ICD-10-CM | POA: Diagnosis not present

## 2019-09-02 DIAGNOSIS — E034 Atrophy of thyroid (acquired): Secondary | ICD-10-CM | POA: Diagnosis not present

## 2019-09-02 DIAGNOSIS — F5104 Psychophysiologic insomnia: Secondary | ICD-10-CM | POA: Diagnosis not present

## 2019-09-02 MED ORDER — BENAZEPRIL-HYDROCHLOROTHIAZIDE 20-25 MG PO TABS: 1.0000 | ORAL_TABLET | Freq: Every day | ORAL | 3 refills | Status: DC

## 2019-09-02 MED ORDER — ZOLPIDEM TARTRATE 10 MG PO TABS: 10.0000 mg | ORAL_TABLET | Freq: Every evening | ORAL | 3 refills | Status: DC | PRN

## 2019-09-02 MED ORDER — TAMSULOSIN HCL 0.4 MG PO CAPS: ORAL_CAPSULE | ORAL | 3 refills | Status: DC

## 2019-09-02 MED ORDER — LEVOTHYROXINE SODIUM 125 MCG PO TABS: 125.0000 ug | ORAL_TABLET | Freq: Every day | ORAL | 3 refills | Status: DC

## 2019-09-02 NOTE — Progress Notes (Signed)
Telemedicine Encounter- SOAP NOTE Established Patient My chart video conference attempted without success This telephone encounter was conducted with the patient's (or proxy's) verbal consent via audio telecommunications: yes/no: Yes Patient was instructed to have this encounter in a suitably private space; and to only have persons present to whom they give permission to participate. In addition, patient identity was confirmed by use of name plus two identifiers (DOB and address).  I discussed the limitations, risks, security and privacy concerns of performing an evaluation and management service by telephone and the availability of in person appointments. I also discussed with the patient that there may be a patient responsible charge related to this service. The patient expressed understanding and agreed to proceed.  I spent a total of TIME; 0 MIN TO 60 MIN: 15 minutes talking with the patient or their proxy.  Chief Complaint  Patient presents with  . Insomnia    pt states his insomnia is about the same,   . Hypertension    pt states he has been checking BP 120/80 is average    Subjective   Henry [name] is a 74 y.o. male established patient. Telephone visit today for follow-up of chronic medical problems, hypertension in particular.  Doing well.  No complaints.  Needs medication refills.   HPI   Patient Active Problem List   Diagnosis Date Noted  . Irritable bowel syndrome with diarrhea 09/07/2016  . Iron deficiency anemia due to chronic blood loss 09/07/2016  . Hypothyroidism due to acquired atrophy of thyroid 08/15/2016  . Degenerative disc disease, lumbar 03/16/2014  . Insomnia 03/16/2014  . Chronic pain syndrome 03/16/2014  . ARTERIOVENOUS MALFORMATION 07/16/2009  . Essential hypertension 07/19/2007    Past Medical History:  Diagnosis Date  . Anxiety   . Arthritis   . Degenerative disc disease, lumbar   . Hypertension   . Hypothyroidism   . Lentigo maligna  melanoma ([place]) 07/21/2015   [facility]- [place] MOHS    Current Outpatient Medications  Medication Sig Dispense Refill  . benazepril-hydrochlorthiazide (LOTENSIN HCT) 20-25 MG tablet Take 1 tablet by mouth daily. 90 tablet 3  . cholecalciferol (VITAMIN D) 1000 UNITS tablet Take 1,000 Units by mouth daily.    . diclofenac sodium (VOLTAREN) 1 % GEL Apply 4 g topically 4 (four) times daily. (Patient not taking: Reported on 06/11/2019) 5 Tube 1  . fish oil-omega-3 fatty acids 1000 MG capsule Take 2 g by mouth daily.    Marland Kitchen levothyroxine (SYNTHROID) 125 MCG tablet Take 1 tablet (125 mcg total) by mouth daily before breakfast. 90 tablet 3  . LORazepam (ATIVAN) 1 MG tablet Take 1 tablet (1 mg total) by mouth daily as needed for anxiety. 20 tablet 1  . nabumetone (RELAFEN) 500 MG tablet Take 1 tablet (500 mg total) by mouth 2 (two) times daily. (Patient not taking: Reported on 03/04/2019) 60 tablet 3  . tamsulosin (FLOMAX) 0.4 MG CAPS capsule TAKE 1 CAPSULE BY MOUTH DAILY AFTER BREAKFAST. 90 capsule 1  . traMADol (ULTRAM) 50 MG tablet Take 1 tablet (50 mg total) by mouth every 6 (six) hours as needed for up to 7 days. 30 tablet 0  . zolpidem (AMBIEN) 10 MG tablet Take 1 tablet (10 mg total) by mouth at bedtime as needed for sleep. Should not be taken every day. 30 tablet 3   No current facility-administered medications for this visit.    Allergies  Allergen Reactions  . Ciprofloxacin Itching and Rash    Social  History   Socioeconomic History  . Marital status: Married    Spouse name: Not on file  . Number of children: Not on file  . Years of education: Not on file  . Highest education level: Not on file  Occupational History  . Not on file  Tobacco Use  . Smoking status: Never Smoker  . Smokeless tobacco: Never Used  Substance and Sexual Activity  . Alcohol use: Not on file  . Drug use: Not on file  . Sexual activity: Yes  Other Topics Concern  . Not on file  Social History Narrative  .  Not on file   Social Determinants of Health   Financial Resource Strain:   . Difficulty of Paying Living Expenses:   Food Insecurity:   . Worried About Charity fundraiser in the Last Year:   . Arboriculturist in the Last Year:   Transportation Needs:   . Film/video editor (Medical):   Marland Kitchen Lack of Transportation (Non-Medical):   Physical Activity:   . Days of Exercise per Week:   . Minutes of Exercise per Session:   Stress:   . Feeling of Stress :   Social Connections:   . Frequency of Communication with Friends and Family:   . Frequency of Social Gatherings with Friends and Family:   . Attends Religious Services:   . Active Member of Clubs or Organizations:   . Attends Archivist Meetings:   Marland Kitchen Marital Status:   Intimate Partner Violence:   . Fear of Current or Ex-Partner:   . Emotionally Abused:   Marland Kitchen Physically Abused:   . Sexually Abused:     Review of Systems  Constitutional: Negative.  Negative for chills and fever.  HENT: Negative.  Negative for congestion and sore throat.   Respiratory: Negative.  Negative for cough and shortness of breath.   Cardiovascular: Negative.  Negative for chest pain and palpitations.  Gastrointestinal: Negative.  Negative for abdominal pain, diarrhea, nausea and vomiting.  Genitourinary: Negative.  Negative for dysuria and hematuria.  Skin: Negative.  Negative for rash.  Neurological: Negative for dizziness and headaches.  All other systems reviewed and are negative.   Objective  Alert and oriented x3 in no apparent respiratory tract. Vitals as reported by the patient: Today's Vitals   09/02/19 1450  BP: 128/84    There are no diagnoses linked to this encounter. [place] was seen today for insomnia and hypertension.  Diagnoses and all orders for this visit:  Essential hypertension -     benazepril-hydrochlorthiazide (LOTENSIN HCT) 20-25 MG tablet; Take 1 tablet by mouth daily.  Hypothyroidism due to acquired atrophy  of thyroid -     levothyroxine (SYNTHROID) 125 MCG tablet; Take 1 tablet (125 mcg total) by mouth daily before breakfast.  Lower urinary tract symptoms (LUTS) -     tamsulosin (FLOMAX) 0.4 MG CAPS capsule; TAKE 1 CAPSULE BY MOUTH DAILY AFTER BREAKFAST.  Psychophysiological insomnia -     zolpidem (AMBIEN) 10 MG tablet; Take 1 tablet (10 mg total) by mouth at bedtime as needed for sleep. Should not be taken every day.   Clinically stable.  No medical concerns identified during this visit.  Continue present medications.  No changes.  Office visit in 6 months.  I discussed the assessment and treatment plan with the patient. The patient was provided an opportunity to ask questions and all were answered. The patient agreed with the plan and demonstrated an understanding of the  instructions.   The patient was advised to call back or seek an in-person evaluation if the symptoms worsen or if the condition fails to improve as anticipated.  I provided 15 minutes of non-face-to-face time during this encounter.  Horald Pollen, MD  Primary Care at [facility]

## 2019-10-15 DIAGNOSIS — R1084 Generalized abdominal pain: Secondary | ICD-10-CM | POA: Diagnosis not present

## 2019-10-15 DIAGNOSIS — R3912 Poor urinary stream: Secondary | ICD-10-CM | POA: Diagnosis not present

## 2019-10-15 DIAGNOSIS — R350 Frequency of micturition: Secondary | ICD-10-CM | POA: Diagnosis not present

## 2019-10-15 DIAGNOSIS — N401 Enlarged prostate with lower urinary tract symptoms: Secondary | ICD-10-CM | POA: Diagnosis not present

## 2019-10-15 DIAGNOSIS — Z125 Encounter for screening for malignant neoplasm of prostate: Secondary | ICD-10-CM | POA: Diagnosis not present

## 2019-10-15 DIAGNOSIS — R351 Nocturia: Secondary | ICD-10-CM | POA: Diagnosis not present

## 2019-10-15 DIAGNOSIS — R3915 Urgency of urination: Secondary | ICD-10-CM | POA: Diagnosis not present

## 2019-11-06 DIAGNOSIS — N281 Cyst of kidney, acquired: Secondary | ICD-10-CM | POA: Diagnosis not present

## 2019-11-06 DIAGNOSIS — K439 Ventral hernia without obstruction or gangrene: Secondary | ICD-10-CM | POA: Diagnosis not present

## 2019-11-06 DIAGNOSIS — D7389 Other diseases of spleen: Secondary | ICD-10-CM | POA: Diagnosis not present

## 2019-11-06 DIAGNOSIS — R1084 Generalized abdominal pain: Secondary | ICD-10-CM | POA: Diagnosis not present

## 2019-11-06 DIAGNOSIS — I700 Atherosclerosis of aorta: Secondary | ICD-10-CM | POA: Diagnosis not present

## 2019-11-12 DIAGNOSIS — H2513 Age-related nuclear cataract, bilateral: Secondary | ICD-10-CM | POA: Diagnosis not present

## 2019-11-20 DIAGNOSIS — D49512 Neoplasm of unspecified behavior of left kidney: Secondary | ICD-10-CM | POA: Diagnosis not present

## 2019-12-31 ENCOUNTER — Other Ambulatory Visit: Payer: Self-pay | Admitting: Emergency Medicine

## 2019-12-31 DIAGNOSIS — F411 Generalized anxiety disorder: Secondary | ICD-10-CM

## 2019-12-31 NOTE — Telephone Encounter (Signed)
Requested medication (s) are due for refill today: Yes  Requested medication (s) are on the active medication list: Yes  Last refill:  06/02/19  Future visit scheduled: No  Notes to clinic:  See request.    Requested Prescriptions  Pending Prescriptions Disp Refills   LORazepam (ATIVAN) 1 MG tablet [Pharmacy Med Name: LORAZEPAM 1 MG TABLET] 20 tablet     Sig: TAKE 1 TABLET BY MOUTH DAILY AS NEEDED FOR ANXIETY      Not Delegated - Psychiatry:  Anxiolytics/Hypnotics Failed - 12/31/2019  8:41 AM      Failed - This refill cannot be delegated      Failed - Urine Drug Screen completed in last 360 days.      Passed - Valid encounter within last 6 months    Recent Outpatient Visits           4 months ago Essential hypertension   Primary Care at [facility], Ines Bloomer, MD   6 months ago Medicare annual wellness visit, subsequent   Primary Care at [facility], Ines Bloomer, MD   10 months ago Essential hypertension   Primary Care at [place], Ines Bloomer, MD   1 year ago Psychophysiological insomnia   Primary Care at [facility], Ines Bloomer, MD   1 year ago Essential hypertension   [facility] Care at [facility], Ranell Patrick, MD

## 2020-01-01 NOTE — Telephone Encounter (Signed)
Patient is requesting a refill of the following medications: Requested Prescriptions   Pending Prescriptions Disp Refills   LORazepam (ATIVAN) 1 MG tablet [Pharmacy Med Name: LORAZEPAM 1 MG TABLET] 20 tablet     Sig: TAKE 1 TABLET BY MOUTH DAILY AS NEEDED FOR ANXIETY    Date of patient request: 01/01/2020 Last office visit: 06/11/2019 Date of last refill: 06/02/2019 Last refill amount: 20 Follow up time period per chart: N/A

## 2020-01-14 DIAGNOSIS — I700 Atherosclerosis of aorta: Secondary | ICD-10-CM | POA: Diagnosis not present

## 2020-01-14 DIAGNOSIS — D7389 Other diseases of spleen: Secondary | ICD-10-CM | POA: Diagnosis not present

## 2020-01-14 DIAGNOSIS — D4102 Neoplasm of uncertain behavior of left kidney: Secondary | ICD-10-CM | POA: Diagnosis not present

## 2020-01-14 DIAGNOSIS — N281 Cyst of kidney, acquired: Secondary | ICD-10-CM | POA: Diagnosis not present

## 2020-01-14 DIAGNOSIS — C642 Malignant neoplasm of left kidney, except renal pelvis: Secondary | ICD-10-CM | POA: Diagnosis not present

## 2020-01-30 DIAGNOSIS — R351 Nocturia: Secondary | ICD-10-CM | POA: Diagnosis not present

## 2020-01-30 DIAGNOSIS — R3912 Poor urinary stream: Secondary | ICD-10-CM | POA: Diagnosis not present

## 2020-01-30 DIAGNOSIS — N281 Cyst of kidney, acquired: Secondary | ICD-10-CM | POA: Diagnosis not present

## 2020-01-30 DIAGNOSIS — R3121 Asymptomatic microscopic hematuria: Secondary | ICD-10-CM | POA: Diagnosis not present

## 2020-01-30 DIAGNOSIS — N401 Enlarged prostate with lower urinary tract symptoms: Secondary | ICD-10-CM | POA: Diagnosis not present

## 2020-03-03 DIAGNOSIS — R351 Nocturia: Secondary | ICD-10-CM | POA: Diagnosis not present

## 2020-03-03 DIAGNOSIS — N281 Cyst of kidney, acquired: Secondary | ICD-10-CM | POA: Diagnosis not present

## 2020-03-03 DIAGNOSIS — R300 Dysuria: Secondary | ICD-10-CM | POA: Diagnosis not present

## 2020-03-03 DIAGNOSIS — R3912 Poor urinary stream: Secondary | ICD-10-CM | POA: Diagnosis not present

## 2020-03-03 DIAGNOSIS — N401 Enlarged prostate with lower urinary tract symptoms: Secondary | ICD-10-CM | POA: Diagnosis not present

## 2020-03-17 ENCOUNTER — Other Ambulatory Visit: Payer: Self-pay | Admitting: Emergency Medicine

## 2020-03-17 DIAGNOSIS — F5104 Psychophysiologic insomnia: Secondary | ICD-10-CM

## 2020-03-18 NOTE — Telephone Encounter (Signed)
Requested medications are due for refill today yes  Requested medications are on the active medication list yes  Last refill 11/1  Last visit 08/2019  Future visit scheduled no  Notes to clinic Rx states not to take nightly and asking for refill at 30 day mark, Not Delegated.

## 2020-03-19 NOTE — Telephone Encounter (Signed)
Patient is requesting a refill of the following medications: Requested Prescriptions   Pending Prescriptions Disp Refills   zolpidem (AMBIEN) 10 MG tablet [Pharmacy Med Name: ZOLPIDEM TARTRATE 10 MG TABLET] 30 tablet     Sig: Take 1 tablet (10 mg total) by mouth at bedtime as needed for sleep. Should not be taken every day.    Date of patient request: 03/19/2020 Last office visit: 09/02/2019 Date of last refill: 09/02/2019 Last refill amount: 30 Follow up time period per chart: 6 months

## 2020-04-19 ENCOUNTER — Telehealth: Payer: Self-pay | Admitting: Emergency Medicine

## 2020-04-19 NOTE — Telephone Encounter (Signed)
Wife called and stated that she was advised by the pharmacy that they need a PRE auth from the provider for zolpidem (AMBIEN) 10 MG tablet And the pharmacy advised them that no refill was on file but the chart shows there was 1 refill sent with last refill on 12.4.21/ please advise / Pharmacy advised them that it was needed from the Dr and not insurance

## 2020-04-20 NOTE — Telephone Encounter (Signed)
Pt came in regarding this mediation spoke with Raynelle Fanning she stated she will send it in today and it will take up to 72 hours to get approval or denial. Told him that if it approved Raynelle Fanning will give him a call. Please advise.

## 2020-04-27 ENCOUNTER — Encounter: Payer: Self-pay | Admitting: *Deleted

## 2020-04-27 NOTE — Telephone Encounter (Signed)
Patient informed medication approved

## 2020-05-18 ENCOUNTER — Encounter: Payer: Self-pay | Admitting: Emergency Medicine

## 2020-05-18 ENCOUNTER — Other Ambulatory Visit: Payer: Self-pay

## 2020-05-18 ENCOUNTER — Telehealth (INDEPENDENT_AMBULATORY_CARE_PROVIDER_SITE_OTHER): Payer: Medicare HMO | Admitting: Emergency Medicine

## 2020-05-18 VITALS — Ht 66.0 in | Wt 180.0 lb

## 2020-05-18 DIAGNOSIS — D500 Iron deficiency anemia secondary to blood loss (chronic): Secondary | ICD-10-CM

## 2020-05-18 DIAGNOSIS — R399 Unspecified symptoms and signs involving the genitourinary system: Secondary | ICD-10-CM

## 2020-05-18 DIAGNOSIS — E034 Atrophy of thyroid (acquired): Secondary | ICD-10-CM | POA: Diagnosis not present

## 2020-05-18 DIAGNOSIS — F5104 Psychophysiologic insomnia: Secondary | ICD-10-CM

## 2020-05-18 DIAGNOSIS — I10 Essential (primary) hypertension: Secondary | ICD-10-CM

## 2020-05-18 DIAGNOSIS — R69 Illness, unspecified: Secondary | ICD-10-CM | POA: Diagnosis not present

## 2020-05-18 DIAGNOSIS — F411 Generalized anxiety disorder: Secondary | ICD-10-CM

## 2020-05-18 MED ORDER — BENAZEPRIL-HYDROCHLOROTHIAZIDE 20-25 MG PO TABS: 1.0000 | ORAL_TABLET | Freq: Every day | ORAL | 3 refills | Status: DC

## 2020-05-18 MED ORDER — LEVOTHYROXINE SODIUM 125 MCG PO TABS: 125.0000 ug | ORAL_TABLET | Freq: Every day | ORAL | 3 refills | Status: DC

## 2020-05-18 MED ORDER — LORAZEPAM 1 MG PO TABS: 1.0000 mg | ORAL_TABLET | Freq: Every day | ORAL | 1 refills | Status: DC | PRN

## 2020-05-18 MED ORDER — TAMSULOSIN HCL 0.4 MG PO CAPS
0.8000 mg | ORAL_CAPSULE | Freq: Every day | ORAL | 3 refills | Status: DC
Start: 2020-05-18 — End: 2021-03-07

## 2020-05-18 MED ORDER — ZOLPIDEM TARTRATE 10 MG PO TABS: 10.0000 mg | ORAL_TABLET | Freq: Every evening | ORAL | 5 refills | Status: DC | PRN

## 2020-05-18 NOTE — Progress Notes (Signed)
Telemedicine Encounter- SOAP NOTE Established Patient Patient: Home  Provider: Office     This telephone encounter was conducted with the patient's (or proxy's) verbal consent via audio telecommunications: yes/no: Yes Patient was instructed to have this encounter in a suitably private space; and to only have persons present to whom they give permission to participate. In addition, patient identity was confirmed by use of name plus two identifiers (DOB and address).  I discussed the limitations, risks, security and privacy concerns of performing an evaluation and management service by telephone and the availability of in person appointments. I also discussed with the patient that there may be a patient responsible charge related to this service. The patient expressed understanding and agreed to proceed.  I spent a total of TIME; 0 MIN TO 60 MIN: 20 minutes talking with the patient or their proxy.  Chief Complaint  Patient presents with  . Follow-up    Med refills. Patient also would like to discuss if Urology is supposed continue refilling the Flomax or Henry [name]. Patient stated Urology increased his dose to 2 tab.     Subjective   Henry [name] is a 75 y.o. male established patient. Telephone visit today for follow-up and medication refills. Medication list reviewed with patient. No other complaints or medical concerns today.  HPI   Patient Active Problem List   Diagnosis Date Noted  . Irritable bowel syndrome with diarrhea 09/07/2016  . Iron deficiency anemia due to chronic blood loss 09/07/2016  . Hypothyroidism due to acquired atrophy of thyroid 08/15/2016  . Degenerative disc disease, lumbar 03/16/2014  . Insomnia 03/16/2014  . Chronic pain syndrome 03/16/2014  . ARTERIOVENOUS MALFORMATION 07/16/2009  . Essential hypertension 07/19/2007    Past Medical History:  Diagnosis Date  . Anxiety   . Arthritis   . Degenerative disc disease, lumbar   . Hypertension   .  Hypothyroidism   . 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 benazepril-hydrochlorthiazide (LOTENSIN HCT) 20-25 MG tablet Take 1 tablet by mouth daily. 90 tablet 3  . cholecalciferol (VITAMIN D) 1000 UNITS tablet Take 1,000 Units by mouth daily.    . diclofenac sodium (VOLTAREN) 1 % GEL Apply 4 g topically 4 (four) times daily. (Patient not taking: Reported on 06/11/2019) 5 Tube 1  . fish oil-omega-3 fatty acids 1000 MG capsule Take 2 g by mouth daily.    Marland Kitchen levothyroxine (SYNTHROID) 125 MCG tablet Take 1 tablet (125 mcg total) by mouth daily before breakfast. 90 tablet 3  . LORazepam (ATIVAN) 1 MG tablet Take 1 tablet (1 mg total) by mouth daily as needed. for anxiety 20 tablet 1  . nabumetone (RELAFEN) 500 MG tablet Take 1 tablet (500 mg total) by mouth 2 (two) times daily. (Patient not taking: Reported on 03/04/2019) 60 tablet 3  . tamsulosin (FLOMAX) 0.4 MG CAPS capsule TAKE 1 CAPSULE BY MOUTH DAILY AFTER BREAKFAST. 90 capsule 3  . traMADol (ULTRAM) 50 MG tablet Take 1 tablet (50 mg total) by mouth every 6 (six) hours as needed for up to 7 days. 30 tablet 0  . zolpidem (AMBIEN) 10 MG tablet Take 1 tablet (10 mg total) by mouth at bedtime as needed for sleep. Should not be taken every day. 30 tablet 5   No current facility-administered medications for this visit.    Allergies  Allergen Reactions  . Ciprofloxacin Itching and Rash  Social History   Socioeconomic History  . Marital status: Married    Spouse name: Not on file  . Number of children: Not on file  . Years of education: Not on file  . Highest education level: Not on file  Occupational History  . Not on file  Tobacco Use  . Smoking status: Never Smoker  . Smokeless tobacco: Never Used  Substance and Sexual Activity  . Alcohol use: Not on file  . Drug use: Not on file  . Sexual activity: Yes  Other Topics Concern  . Not on  file  Social History Narrative  . Not on file   Social Determinants of Health   Financial Resource Strain: Not on file  Food Insecurity: Not on file  Transportation Needs: Not on file  Physical Activity: Not on file  Stress: Not on file  Social Connections: Not on file  Intimate Partner Violence: Not on file    Review of Systems  Constitutional: Negative.  Negative for chills and fever.  HENT: Negative.  Negative for congestion and sore throat.   Respiratory: Negative for cough and shortness of breath.   Cardiovascular: Negative.  Negative for chest pain and palpitations.  Gastrointestinal: Negative for abdominal pain, diarrhea, nausea and vomiting.  Skin: Negative.  Negative for rash.  Neurological: Negative for dizziness and headaches.  All other systems reviewed and are negative.   Objective  Alert and oriented x3 in no apparent respiratory distress Vitals as reported by the patient: Today's Vitals   05/18/20 1605  Weight: 180 lb (81.6 kg)  Height: 5\' 6"  (1.676 m)    [name] was seen today for follow-up.  Diagnoses and all orders for this visit:  Essential hypertension -     benazepril-hydrochlorthiazide (LOTENSIN HCT) 20-25 MG tablet; Take 1 tablet by mouth daily.  Psychophysiological insomnia -     zolpidem (AMBIEN) 10 MG tablet; Take 1 tablet (10 mg total) by mouth at bedtime as needed for sleep. Should not be taken every day.  Lower urinary tract symptoms (LUTS)  Hypothyroidism due to acquired atrophy of thyroid -     levothyroxine (SYNTHROID) 125 MCG tablet; Take 1 tablet (125 mcg total) by mouth daily before breakfast.  Anxiety state -     LORazepam (ATIVAN) 1 MG tablet; Take 1 tablet (1 mg total) by mouth daily as needed. for anxiety  Iron deficiency anemia due to chronic blood loss -     IBC Panel([facility]); Future -     Comprehensive metabolic panel; Future -     CBC with Differential/Platelet; Future     I discussed the assessment and treatment  plan with the patient. The patient was provided an opportunity to ask questions and all were answered. The patient agreed with the plan and demonstrated an understanding of the instructions.   The patient was advised to call back or seek an in-person evaluation if the symptoms worsen or if the condition fails to improve as anticipated.  I provided 20 minutes of non-face-to-face time during this encounter.  Horald Pollen, MD  Primary Care at [facility]

## 2020-05-18 NOTE — Patient Instructions (Signed)
° ° ° °  If you have lab work done today you will be contacted with your lab results within the next 2 weeks.  If you have not heard from us then please contact us. The fastest way to get your results is to register for My Chart. ° ° °IF you received an x-ray today, you will receive an invoice from [facility]. Please contact [facility] at [phone] with questions or concerns regarding your invoice.  ° °IF you received labwork today, you will receive an invoice from [facility]. Please contact [facility] at [phone] with questions or concerns regarding your invoice.  ° °Our billing staff will not be able to assist you with questions regarding bills from these companies. ° °You will be contacted with the lab results as soon as they are available. The fastest way to get your results is to activate your My Chart account. Instructions are located on the last page of this paperwork. If you have not heard from us regarding the results in 2 weeks, please contact this office. °  ° ° ° °

## 2020-07-01 ENCOUNTER — Other Ambulatory Visit: Payer: Self-pay

## 2020-07-01 ENCOUNTER — Ambulatory Visit (INDEPENDENT_AMBULATORY_CARE_PROVIDER_SITE_OTHER): Payer: Medicare HMO | Admitting: Physician Assistant

## 2020-07-01 ENCOUNTER — Encounter: Payer: Self-pay | Admitting: Orthopedic Surgery

## 2020-07-01 ENCOUNTER — Ambulatory Visit: Payer: Self-pay

## 2020-07-01 DIAGNOSIS — M79675 Pain in left toe(s): Secondary | ICD-10-CM | POA: Diagnosis not present

## 2020-07-01 NOTE — Progress Notes (Signed)
Office Visit Note   Patient: Henry [name]           Date of Birth: Nov 08, 1945           MRN: [id number] Visit Date: 07/01/2020              Requested by: Horald Pollen, MD [place],  [place] [address] PCP: Horald Pollen, MD  Chief Complaint  Patient presents with  . Left Foot - Pain    GT pain       HPI: Patient is a pleasant 75 year old gentleman with a chief complaint of left great toe pain and swelling.  He has no known injury.  He said this happened about a week and a half ago when he woke up with redness and swelling and pain in his great toe.  This was painful enough that he does not wanted to come back.  He denies any history or family history of gout.  He has had some pain in some of his other joints but has attributed this to traumatic injuries from the remote past.  Assessment & Plan: Visit Diagnoses:  1. Great toe pain, left     Plan: Findings consistent with gout.  We will draw a uric acid today.  I explained this to him his wife.  We will call them with the results.  He is not anxious to be on another medication but we will discuss this with  Follow-Up Instructions: No follow-ups on file.   Ortho Exam  Patient is alert, oriented, no adenopathy, well-dressed, normal affect, normal respiratory effort. As I obtain the results examination of his foot today mild erythema of the left great toe and mild swelling no ascending cellulitis no other swelling in the foot no area of concern for skin breakdown or foreign body.  Range of motion to the first MTP joint is fairly good.  No evidence of infection.  Picture from his wife's phone was reviewed from when this occurred at which time he had erythema and swelling isolated to the great toe.  Imaging: No results found. No images are attached to the encounter.  Labs: Lab Results  Component Value Date   HGBA1C 5.7 (H) 01/16/2017   ESRSEDRATE 10 03/26/2014   LABORGA NO GROWTH 5 DAYS 03/26/2014      Lab Results  Component Value Date   ALBUMIN 4.3 07/31/2018   ALBUMIN 4.5 07/11/2017   ALBUMIN 4.5 01/16/2017    No results found for: MG No results found for: VD25OH  No results found for: PREALBUMIN CBC EXTENDED Latest Ref Rng & Units 07/31/2018 07/11/2017 01/16/2017  WBC 3.4 - 10.8 x10E3/uL 7.8 7.4 7.6  RBC 4.14 - 5.80 x10E6/uL 5.78 5.62 5.41  HGB 13.0 - 17.7 g/dL 15.3 15.1 14.2  HCT 37.5 - 51.0 % 47.9 46.0 41.9  PLT 150 - 450 x10E3/uL 301 369 302  NEUTROABS 1.4 - 7.0 x10E3/uL 4.9 4.7 -  LYMPHSABS 0.7 - 3.1 x10E3/uL 1.9 1.8 -     There is no height or weight on file to calculate BMI.  Orders:  Orders Placed This Encounter  Procedures  . XR Toe Great Left  . Uric acid   No orders of the defined types were placed in this encounter.    Procedures: No procedures performed  Clinical Data: No additional findings.  ROS:  All other systems negative, except as noted in the HPI. Review of Systems  Objective: Vital Signs: There were no vitals taken for this  visit.  Specialty Comments:  No specialty comments available.  PMFS History: Patient Active Problem List   Diagnosis Date Noted  . Irritable bowel syndrome with diarrhea 09/07/2016  . Iron deficiency anemia due to chronic blood loss 09/07/2016  . Hypothyroidism due to acquired atrophy of thyroid 08/15/2016  . Degenerative disc disease, lumbar 03/16/2014  . Insomnia 03/16/2014  . Chronic pain syndrome 03/16/2014  . ARTERIOVENOUS MALFORMATION 07/16/2009  . Essential hypertension 07/19/2007   Past Medical History:  Diagnosis Date  . Anxiety   . Arthritis   . Degenerative disc disease, lumbar   . Hypertension   . Hypothyroidism   . 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 Heart disease Mother        CAD and CHF    Past Surgical History:  Procedure Laterality Date  . CHOLECYSTECTOMY    . COLON SURGERY  08/07/2006    colostomy & small bowel resection  . FRACTURE SURGERY     left shoulder  . SPINE SURGERY    . 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 Not on file  Tobacco Use  . Smoking status: Never Smoker  . Smokeless tobacco: Never Used  Substance and Sexual Activity  . Alcohol use: Not on file  . Drug use: Not on file  . Sexual activity: Yes

## 2020-07-02 ENCOUNTER — Other Ambulatory Visit: Payer: Self-pay | Admitting: Physician Assistant

## 2020-07-02 LAB — URIC ACID: Uric Acid, Serum: 7.8 mg/dL (ref 4.0–8.0)

## 2020-07-02 MED ORDER — ALLOPURINOL 100 MG PO TABS: 100.0000 mg | ORAL_TABLET | Freq: Two times a day (BID) | ORAL | 3 refills | Status: DC

## 2020-07-02 MED ORDER — COLCHICINE 0.6 MG PO TABS: ORAL_TABLET | ORAL | 2 refills | Status: DC

## 2020-07-08 ENCOUNTER — Other Ambulatory Visit: Payer: Self-pay | Admitting: Emergency Medicine

## 2020-07-08 DIAGNOSIS — D500 Iron deficiency anemia secondary to blood loss (chronic): Secondary | ICD-10-CM | POA: Diagnosis not present

## 2020-07-08 DIAGNOSIS — I10 Essential (primary) hypertension: Secondary | ICD-10-CM | POA: Diagnosis not present

## 2020-07-08 NOTE — Addendum Note (Signed)
Addended by: Teresita Madura on: 0/34/9611 03:39 PM   Modules accepted: Orders

## 2020-07-09 LAB — ANEMIA PANEL
Ferritin: 32 ng/mL (ref 30–400)
Folate, Hemolysate: 367 ng/mL
Folate, RBC: 810 ng/mL (ref >498)
Hematocrit: 45.3 % (ref 37.5–51.0)
Iron Saturation: 12 % — ABNORMAL LOW (ref 15–55)
Iron: 43 ug/dL (ref 38–169)
Retic Ct Pct: 1.1 % (ref 0.6–2.6)
Total Iron Binding Capacity: 370 ug/dL (ref 250–450)
UIBC: 327 ug/dL (ref 111–343)
Vitamin B-12: >2000 pg/mL — ABNORMAL HIGH (ref 232–1245)

## 2020-07-09 LAB — COMPREHENSIVE METABOLIC PANEL
ALT: 19 IU/L (ref 0–44)
AST: 17 IU/L (ref 0–40)
Albumin/Globulin Ratio: 1.7 (ref 1.2–2.2)
Albumin: 4.2 g/dL (ref 3.7–4.7)
Alkaline Phosphatase: 89 IU/L (ref 44–121)
BUN/Creatinine Ratio: 11 (ref 10–24)
BUN: 17 mg/dL (ref 8–27)
Bilirubin Total: 0.7 mg/dL (ref 0.0–1.2)
CO2: 18 mmol/L — ABNORMAL LOW (ref 20–29)
Calcium: 9.9 mg/dL (ref 8.6–10.2)
Chloride: 100 mmol/L (ref 96–106)
Creatinine, Ser: 1.48 mg/dL — ABNORMAL HIGH (ref 0.76–1.27)
Globulin, Total: 2.5 g/dL (ref 1.5–4.5)
Glucose: 122 mg/dL — ABNORMAL HIGH (ref 65–99)
Potassium: 3.8 mmol/L (ref 3.5–5.2)
Sodium: 136 mmol/L (ref 134–144)
Total Protein: 6.7 g/dL (ref 6.0–8.5)
eGFR: 50 mL/min/{1.73_m2} — ABNORMAL LOW (ref >59)

## 2020-07-09 LAB — CBC WITH DIFFERENTIAL/PLATELET
Basophils Absolute: 0 10*3/uL (ref 0.0–0.2)
Basos: 1 %
EOS (ABSOLUTE): 0.1 10*3/uL (ref 0.0–0.4)
Eos: 1 %
Hemoglobin: 15.6 g/dL (ref 13.0–17.7)
Immature Grans (Abs): 0 10*3/uL (ref 0.0–0.1)
Immature Granulocytes: 0 %
Lymphocytes Absolute: 2 10*3/uL (ref 0.7–3.1)
Lymphs: 27 %
MCH: 27.7 pg (ref 26.6–33.0)
MCHC: 34.4 g/dL (ref 31.5–35.7)
MCV: 81 fL (ref 79–97)
Monocytes Absolute: 0.9 10*3/uL (ref 0.1–0.9)
Monocytes: 12 %
Neutrophils Absolute: 4.5 10*3/uL (ref 1.4–7.0)
Neutrophils: 59 %
Platelets: 296 10*3/uL (ref 150–450)
RBC: 5.63 x10E6/uL (ref 4.14–5.80)
RDW: 13.2 % (ref 11.6–15.4)
WBC: 7.5 10*3/uL (ref 3.4–10.8)

## 2020-08-03 ENCOUNTER — Ambulatory Visit: Payer: Medicare HMO | Admitting: Orthopedic Surgery

## 2020-08-03 DIAGNOSIS — M1A09X0 Idiopathic chronic gout, multiple sites, without tophus (tophi): Secondary | ICD-10-CM | POA: Diagnosis not present

## 2020-08-03 DIAGNOSIS — M79675 Pain in left toe(s): Secondary | ICD-10-CM | POA: Diagnosis not present

## 2020-08-04 LAB — URIC ACID: Uric Acid, Serum: 4.9 mg/dL (ref 4.0–8.0)

## 2020-08-05 ENCOUNTER — Encounter: Payer: Self-pay | Admitting: Orthopedic Surgery

## 2020-08-05 NOTE — Progress Notes (Signed)
Office Visit Note   Patient: Henry [name]           Date of Birth: [birth year]           MRN: [id number] Visit Date: 08/03/2020              Requested by: Horald Pollen, MD [place],  [place] [address] PCP: Horald Pollen, MD  Chief Complaint  Patient presents with  . Left Foot - Follow-up    Uric acid 07/01/20 7.8      HPI: Patient is a 75 year old gentleman who presents in follow-up for gout left great toe.  Patient has completed his colchicine he is currently on allopurinol he denies any pain.  Most recent uric acid was 7.8 on March 17.  Assessment & Plan: Visit Diagnoses:  1. Great toe pain, left   2. Idiopathic chronic gout of multiple sites without tophus     Plan: Patient will continue with allopurinol 100 mg twice a day repeat uric acid at follow-up in 3 months.  Follow-Up Instructions: Return in about 3 months (around 11/02/2020).   Ortho Exam  Patient is alert, oriented, no adenopathy, well-dressed, normal affect, normal respiratory effort. Examination there is no redness no cellulitis around the left great toe there is no ulcer there is no pain with range of motion no tophaceous deposits.  Imaging: No results found. No images are attached to the encounter.  Labs: Lab Results  Component Value Date   HGBA1C 5.7 (H) 01/16/2017   ESRSEDRATE 10 03/26/2014   LABURIC 4.9 08/03/2020   LABURIC 7.8 07/01/2020   LABORGA NO GROWTH 5 DAYS 03/26/2014     Lab Results  Component Value Date   ALBUMIN 4.2 07/08/2020   ALBUMIN 4.3 07/31/2018   ALBUMIN 4.5 07/11/2017    No results found for: MG No results found for: VD25OH  No results found for: PREALBUMIN CBC EXTENDED Latest Ref Rng & Units 07/08/2020 07/31/2018 07/11/2017  WBC 3.4 - 10.8 x10E3/uL 7.5 7.8 7.4  RBC 4.14 - 5.80 x10E6/uL 5.63 5.78 5.62  HGB 13.0 - 17.7 g/dL 15.6 15.3 15.1  HCT 37.5 - 51.0 % 45.3 47.9 46.0  PLT 150 - 450 x10E3/uL 296 301 369  NEUTROABS 1.4 - 7.0  x10E3/uL 4.5 4.9 4.7  LYMPHSABS 0.7 - 3.1 x10E3/uL 2.0 1.9 1.8     There is no height or weight on file to calculate BMI.  Orders:  Orders Placed This Encounter  Procedures  . Uric acid   No orders of the defined types were placed in this encounter.    Procedures: No procedures performed  Clinical Data: No additional findings.  ROS:  All other systems negative, except as noted in the HPI. Review of Systems  Objective: Vital Signs: There were no vitals taken for this visit.  Specialty Comments:  No specialty comments available.  PMFS History: Patient Active Problem List   Diagnosis Date Noted  . Irritable bowel syndrome with diarrhea 09/07/2016  . Iron deficiency anemia due to chronic blood loss 09/07/2016  . Hypothyroidism due to acquired atrophy of thyroid 08/15/2016  . Degenerative disc disease, lumbar 03/16/2014  . Insomnia 03/16/2014  . Chronic pain syndrome 03/16/2014  . ARTERIOVENOUS MALFORMATION 07/16/2009  . Essential hypertension 07/19/2007   Past Medical History:  Diagnosis Date  . Anxiety   . Arthritis   . Degenerative disc disease, lumbar   . Hypertension   . Hypothyroidism   . 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 Heart disease Mother        CAD and CHF    Past Surgical History:  Procedure Laterality Date  . CHOLECYSTECTOMY    . COLON SURGERY  08/07/2006   colostomy & small bowel resection  . FRACTURE SURGERY     left shoulder  . SPINE SURGERY    . 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 Not on file  Tobacco Use  . Smoking status: Never Smoker  . Smokeless tobacco: Never Used  Substance and Sexual Activity  . Alcohol use: Not on file  . Drug use: Not on file  . Sexual activity: Yes

## 2020-09-20 DIAGNOSIS — M25512 Pain in left shoulder: Secondary | ICD-10-CM | POA: Diagnosis not present

## 2020-09-20 DIAGNOSIS — M19012 Primary osteoarthritis, left shoulder: Secondary | ICD-10-CM | POA: Diagnosis not present

## 2020-09-30 ENCOUNTER — Other Ambulatory Visit: Payer: Self-pay | Admitting: Physician Assistant

## 2020-10-05 ENCOUNTER — Telehealth (INDEPENDENT_AMBULATORY_CARE_PROVIDER_SITE_OTHER): Payer: Medicare HMO | Admitting: Family Medicine

## 2020-10-05 DIAGNOSIS — U071 COVID-19: Secondary | ICD-10-CM | POA: Diagnosis not present

## 2020-10-05 MED ORDER — BENZONATATE 100 MG PO CAPS: 100.0000 mg | ORAL_CAPSULE | Freq: Three times a day (TID) | ORAL | 0 refills | Status: DC | PRN

## 2020-10-05 MED ORDER — MOLNUPIRAVIR EUA 200MG CAPSULE: 4.0000 | ORAL_CAPSULE | Freq: Two times a day (BID) | ORAL | 0 refills | Status: AC

## 2020-10-05 NOTE — Patient Instructions (Signed)
  HOME CARE TIPS:  -I sent the medication(s) we discussed to your pharmacy: No orders of the defined types were placed in this encounter.    -I sent in the [facility] treatment or referral you requested per our discussion. Please see the information provided below and discuss further with the pharmacist/treatment team.   -can use tylenol or aleve if needed for fevers, aches and pains per instructions  -can use nasal saline a few times per day if you have nasal congestion; sometimes  a short course of Afrin nasal spray for 3 days can help with symptoms as well  -stay hydrated, drink plenty of fluids and eat small healthy meals - avoid dairy  -can take 1000 IU (60mcg) Vit D3 and 100-500 mg of Vit C daily per instructions  -If the Covid test is positive, check out the [facility] website for more information on home care, transmission and treatment for COVID19  -follow up with your doctor in 2-3 days unless improving and feeling better  -stay home while sick, except to seek medical care. If you have COVID19, ideally it would be best to stay home for a full 10 days since the onset of symptoms PLUS one day of no fever and feeling better. Wear a good mask that fits snugly (such as N95 or KN95) if around others to reduce the risk of transmission.  It was nice to meet you today, and I really hope you are feeling better soon. I help [facility] out with telemedicine visits on Tuesdays and Thursdays and am available for visits on those days. If you have any concerns or questions following this visit please schedule a follow up visit with your Primary Care doctor or seek care at a local urgent care clinic to avoid delays in care.    Seek in person care or schedule a follow up video visit promptly if your symptoms worsen, new concerns arise or you are not improving with treatment. Call 911 and/or seek emergency care if your symptoms are severe or life threatening.

## 2020-10-05 NOTE — Progress Notes (Signed)
Virtual Visit via Telephone Note  I connected with [place] on 10/05/20 at  1:20 PM EDT by telephone and verified that I am speaking with the correct person using two identifiers.   I discussed the limitations, risks, security and privacy concerns of performing an evaluation and management service by telephone and the availability of in person appointments. I also discussed with the patient that there may be a patient responsible charge related to this service. The patient expressed understanding and agreed to proceed.  Location patient: home, [place] Location provider: work or home office Participants present for the call: patient, provider Patient did not have a visit with me in the prior 7 days to address this/these issue(s).   History of Present Illness:  Acute telemedicine visit for Covid19: -Onset: 10/02/20; had a positive covid test that days -Symptoms include: congestion, cough, sore throats, fever initially, now lower, some body aches, rib cage hurts when he coughs, some diarrhea -Denies: adamantly denies CP (except with rib cage when coughs), vomiting, SOB, inability to eat/drink/get out of bed -Pertinent past medical history:see below -Pertinent medication allergies: Allergies  Allergen Reactions   Ciprofloxacin Itching and Rash  -COVID-19 vaccine status: had 2 doses -no labs in last several months, hx kidney issues  Past Medical History:  Diagnosis Date   Anxiety    Arthritis    Degenerative disc disease, lumbar    Hypertension    Hypothyroidism    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 I do not appreciate any SOB. Speech and thought processing are grossly intact. Patient reported vitals:  Assessment and Plan:  COVID-19   Discussed treatment options, ideal treatment window, potential complications, isolation and precautions for COVID-19.  After lengthy  discussion, the patient and his wife opted for treatment with molnupiravir due to being higher risk for complications of covid or severe disease and other factors. Discussed EUA status of this drug and the fact that there is preliminary limited knowledge of risks/interactions/side effects per EUA document vs possible benefits and precautions. This information was shared with patient during the visit and also was provided in patient instructions. Also, advised that patient discuss risks/interactions and use with pharmacist/treatment team as well.  The patient did want a prescription for cough, Tessalon Rx sent.  Other symptomatic care measures summarized in patient instructions. Advised to seek prompt in person care if worsening, new symptoms arise, or if is not improving with treatment. Advised of options for inperson care in case PCP office not available. Did let the patient know that I only do telemedicine shifts for  on Tuesdays and Thursdays and advised a follow up visit with PCP or at an [facility] if has further questions or concerns.   Follow Up Instructions:  I did not refer this patient for an OV with me in the next 24 hours for this/these issue(s).  I discussed the assessment and treatment plan with the patient. The patient was provided an opportunity to ask questions and all were answered. The patient agreed with the plan and demonstrated an understanding of the instructions.   I spent 22 minutes on the date of this visit in the care of this patient. See summary of tasks completed to properly care for this patient in the detailed notes above which also included counseling of above, review of PMH, medications, allergies, evaluation of the patient and ordering and/or  instructing patient on testing  and care options.     Lucretia Kern, DO

## 2020-10-12 ENCOUNTER — Other Ambulatory Visit: Payer: Self-pay | Admitting: Orthopedic Surgery

## 2020-10-12 DIAGNOSIS — M19012 Primary osteoarthritis, left shoulder: Secondary | ICD-10-CM

## 2020-10-14 ENCOUNTER — Other Ambulatory Visit: Payer: Self-pay

## 2020-10-14 ENCOUNTER — Ambulatory Visit: Payer: Medicare HMO | Admitting: Surgery

## 2020-10-14 ENCOUNTER — Ambulatory Visit: Payer: Self-pay

## 2020-10-14 ENCOUNTER — Encounter: Payer: Self-pay | Admitting: Surgery

## 2020-10-14 VITALS — BP 125/72 | HR 75 | Temp 97.9°F | Ht 66.0 in | Wt 180.0 lb

## 2020-10-14 DIAGNOSIS — M5442 Lumbago with sciatica, left side: Secondary | ICD-10-CM

## 2020-10-14 NOTE — Progress Notes (Signed)
Office Visit Note   Patient: Henry [name]           Date of Birth: [birth year]           MRN: [id number] Visit Date: 10/14/2020              Requested by: Horald Pollen, MD [place],  [place] [address] PCP: Horald Pollen, MD   Assessment & Plan: Visit Diagnoses:  1. Bilateral low back pain with left-sided sciatica, unspecified chronicity     Plan: Advised patient that I will attempt to reorder the CT thoracic and lumbar spine that was originally ordered by Dr. Louanne Skye April 2021.  The imaging facility did try to contact patient for those studies but he did not respond.  Advised patient that he will follow-up with Dr. Louanne Skye after completion of those to discuss the results and further treatment options.  Today patient was given IT band stretching exercises for left greater trochanteric bursitis.  Patient also has end-stage DJD left knee that is likely contributing to abnormal gait increase in his symptoms in his low back and left hip.  He may need referral to Dr. Marlou Sa or Dr. Ninfa Linden to address left knee.  Follow-Up Instructions: Return in about 4 weeks (around 11/11/2020) for with dr Louanne Skye to review CT thoracic and lumbar.   Orders:  Orders Placed This Encounter  Procedures   XR Lumbar Spine 2-3 Views   No orders of the defined types were placed in this encounter.     Procedures: No procedures performed   Clinical Data: No additional findings.   Subjective: Chief Complaint  Patient presents with   Lower Back - Pain    HPI 75 year old white male comes in today with complaints of chronic back pain.  Patient was last seen in the clinic for same complaint by Dr. Louanne Skye August 07, 2019.  At that visit he ordered CT cervical, thoracic and lumbar spine.  I reviewed patient's chart and I see that he did not have the studies performed.  I spoke with our procedure scheduler here in the office and I was advised that the cervical CT was denied by insurance and  the imaging facility did reach out to patient to schedule the thoracic and lumbar CT scans but patient never responded back to them.  When I questioned him about this he stated that he was not aware that he was contacted.  He has not been seen in our office for his spine issues since the visit with Dr. Louanne Skye.  States that his pain is chronic from the top of his neck down his whole back.  Patient is ambulating in the office today without the use of a cane or walker.  States that he does have days where he cannot get out of bed.  Complains of left leg radicular pain.  Occasionally uses aspirin and Tylenol.  He reports that at times he has a temperature of "91 degrees" and says that this is related to his cervical spine issues and was advised of this previously by his neurosurgeon. Review of Systems No complaints of cardiac pulmonary GI GU issues  Objective: Vital Signs: BP 125/72   Pulse 75   Temp 97.9 F (36.6 C)   Ht 5\' 6"  (1.676 m)   Wt 180 lb (81.6 kg)   BMI 29.05 kg/m   Physical Exam HENT:     Head: Normocephalic.  Eyes:     Extraocular Movements: Extraocular movements intact.  Musculoskeletal:  Comments: Gait is normal.  He does have bilateral lumbar paraspinal tenderness.  Positive sciatic notch tenderness.  Negative log about his.  Negative straight leg raise.  Question trace bilateral anterior tib and gastroc weakness.  Bilateral calves nontender.  Neurological:     Mental Status: He is alert and oriented to person, place, and time.    Ortho Exam  Specialty Comments:  No specialty comments available.  Imaging: No results found.   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

## 2020-11-02 ENCOUNTER — Ambulatory Visit: Payer: Medicare HMO | Admitting: Physician Assistant

## 2020-11-02 ENCOUNTER — Encounter: Payer: Self-pay | Admitting: Orthopedic Surgery

## 2020-11-02 VITALS — Ht 66.0 in | Wt 180.0 lb

## 2020-11-02 DIAGNOSIS — M79675 Pain in left toe(s): Secondary | ICD-10-CM | POA: Diagnosis not present

## 2020-11-02 NOTE — Progress Notes (Signed)
Office Visit Note   Patient: Henry [name]           Date of Birth: 1945/11/27           MRN: [id number] Visit Date: 11/02/2020              Requested by: Horald Pollen, MD [place],   [id number] PCP: Horald Pollen, MD  Chief Complaint  Patient presents with   Left Foot - Follow-up    Gout of great toe      HPI: Patient presents in follow-up today he has a history of gout he is doing well and has been using allopurinol.  He did say he was having some side effects from it so he has cut down on the dose with some improvement.  He does not report any further flares  Assessment & Plan: Visit Diagnoses:  1. Great toe pain, left     Plan: We will contact patient with the results of the uric acid.  Follow-up as needed  Follow-Up Instructions: No follow-ups on file.   Ortho Exam  Patient is alert, oriented, no adenopathy, well-dressed, normal affect, normal respiratory effort. Examination of his foot demonstrates no swelling no erythema no tenderness with range of motion of the first MTP joint.  Imaging: No results found. No images are attached to the encounter.  Labs: Lab Results  Component Value Date   HGBA1C 5.7 (H) 01/16/2017   ESRSEDRATE 10 03/26/2014   LABURIC 4.9 08/03/2020   LABURIC 7.8 07/01/2020   LABORGA NO GROWTH 5 DAYS 03/26/2014     Lab Results  Component Value Date   ALBUMIN 4.2 07/08/2020   ALBUMIN 4.3 07/31/2018   ALBUMIN 4.5 07/11/2017    No results found for: MG No results found for: VD25OH  No results found for: PREALBUMIN CBC EXTENDED Latest Ref Rng & Units 07/08/2020 07/31/2018 07/11/2017  WBC 3.4 - 10.8 x10E3/uL 7.5 7.8 7.4  RBC 4.14 - 5.80 x10E6/uL 5.63 5.78 5.62  HGB 13.0 - 17.7 g/dL 15.6 15.3 15.1  HCT 37.5 - 51.0 % 45.3 47.9 46.0  PLT 150 - 450 x10E3/uL 296 301 369  NEUTROABS 1.4 - 7.0 x10E3/uL 4.5 4.9 4.7  LYMPHSABS 0.7 - 3.1 x10E3/uL 2.0 1.9 1.8     Body mass index is 29.05 kg/m.  Orders:   Orders Placed This Encounter  Procedures   Uric acid   No orders of the defined types were placed in this encounter.    Procedures: No procedures performed  Clinical Data: No additional findings.  ROS:  All other systems negative, except as noted in the HPI. Review of Systems  Objective: Vital Signs: Ht 5\' 6"  (1.676 m)   Wt 180 lb (81.6 kg)   BMI 29.05 kg/m   Specialty Comments:  No specialty comments available.  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

## 2020-11-03 ENCOUNTER — Telehealth: Payer: Self-pay

## 2020-11-03 LAB — URIC ACID: Uric Acid, Serum: 6.1 mg/dL (ref 4.0–8.0)

## 2020-11-03 NOTE — Telephone Encounter (Signed)
Called and lm on vm to advise of lab results. To call with any questions.

## 2020-11-03 NOTE — Telephone Encounter (Signed)
-----   Message from Newt Minion, MD sent at 11/03/2020  8:32 AM EDT ----- Uric acid 6.1.  Continue with the lower dose of allopurinol. ----- Message ----- From: Interface, [facility] Lab Results In Sent: 11/03/2020  12:18 AM EDT To: Newt Minion, MD

## 2020-11-04 ENCOUNTER — Telehealth: Payer: Self-pay | Admitting: Physician Assistant

## 2020-11-04 NOTE — Telephone Encounter (Signed)
PT called and wants Bevely Palmer to give him a call back at [phone]. He wouldn't tell me what for he just said he needed to speak with her.

## 2020-11-08 ENCOUNTER — Other Ambulatory Visit: Payer: Self-pay | Admitting: Specialist

## 2020-11-08 DIAGNOSIS — M546 Pain in thoracic spine: Secondary | ICD-10-CM

## 2020-11-08 DIAGNOSIS — M545 Low back pain, unspecified: Secondary | ICD-10-CM

## 2020-11-09 ENCOUNTER — Inpatient Hospital Stay: Admission: RE | Admit: 2020-11-09 | Payer: Medicare HMO | Source: Ambulatory Visit

## 2020-11-09 ENCOUNTER — Other Ambulatory Visit: Payer: Self-pay | Admitting: Emergency Medicine

## 2020-11-09 DIAGNOSIS — F5104 Psychophysiologic insomnia: Secondary | ICD-10-CM

## 2020-11-15 ENCOUNTER — Ambulatory Visit
Admission: RE | Admit: 2020-11-15 | Discharge: 2020-11-15 | Disposition: A | Discharge status: Home / Self Care | Payer: Medicare HMO | Source: Ambulatory Visit | Attending: Orthopedic Surgery | Admitting: Orthopedic Surgery

## 2020-11-15 ENCOUNTER — Other Ambulatory Visit: Payer: Medicare HMO

## 2020-11-15 DIAGNOSIS — M25712 Osteophyte, left shoulder: Secondary | ICD-10-CM | POA: Diagnosis not present

## 2020-11-15 DIAGNOSIS — M19012 Primary osteoarthritis, left shoulder: Secondary | ICD-10-CM | POA: Diagnosis not present

## 2020-11-24 ENCOUNTER — Ambulatory Visit: Payer: Medicare HMO | Admitting: Specialist

## 2020-12-06 ENCOUNTER — Ambulatory Visit: Payer: Medicare HMO

## 2020-12-08 ENCOUNTER — Ambulatory Visit: Payer: Medicare HMO

## 2020-12-08 ENCOUNTER — Encounter: Payer: Self-pay | Admitting: Physician Assistant

## 2020-12-08 ENCOUNTER — Ambulatory Visit (INDEPENDENT_AMBULATORY_CARE_PROVIDER_SITE_OTHER): Payer: Medicare HMO | Admitting: Physician Assistant

## 2020-12-08 DIAGNOSIS — M79675 Pain in left toe(s): Secondary | ICD-10-CM | POA: Diagnosis not present

## 2020-12-08 LAB — URIC ACID: Uric Acid, Serum: 6.8 mg/dL (ref 4.0–8.0)

## 2020-12-08 NOTE — Addendum Note (Signed)
Addended by: Pamella Pert on: 12/08/2020 09:38 AM   Modules accepted: Orders

## 2020-12-08 NOTE — Progress Notes (Signed)
Office Visit Note   Patient: Henry [name]           Date of Birth: [birth year]           MRN: [id number] Visit Date: 12/08/2020              Requested by: Horald Pollen, MD [place],  [place] [address] PCP: Horald Pollen, MD  Chief Complaint  Patient presents with   Left Foot - Follow-up    GT toe gout pain      HPI: Patient has a history of gout.  He states he is doing much better.  He decided not to take the allopurinol.  He is here for a blood draw to repeat his uric acid  Assessment & Plan: Visit Diagnoses: No diagnosis found.  Plan: We will call him with the results I did caution him that not taking the allopurinol can put him at a higher risk for recurrence of his symptoms.  Follow-up as needed  Follow-Up Instructions: No follow-ups on file.   Ortho Exam  Patient is alert, oriented, no adenopathy, well-dressed, normal affect, normal respiratory effort. Examination of his foot he has no swelling no redness painless motion of the first MTP joint.  No signs of gout or infection at this time  Imaging: No results found. No images are attached to the encounter.  Labs: Lab Results  Component Value Date   HGBA1C 5.7 (H) 01/16/2017   ESRSEDRATE 10 03/26/2014   LABURIC 6.1 11/02/2020   LABURIC 4.9 08/03/2020   LABURIC 7.8 07/01/2020   LABORGA NO GROWTH 5 DAYS 03/26/2014     Lab Results  Component Value Date   ALBUMIN 4.2 07/08/2020   ALBUMIN 4.3 07/31/2018   ALBUMIN 4.5 07/11/2017    No results found for: MG No results found for: VD25OH  No results found for: PREALBUMIN CBC EXTENDED Latest Ref Rng & Units 07/08/2020 07/31/2018 07/11/2017  WBC 3.4 - 10.8 x10E3/uL 7.5 7.8 7.4  RBC 4.14 - 5.80 x10E6/uL 5.63 5.78 5.62  HGB 13.0 - 17.7 g/dL 15.6 15.3 15.1  HCT 37.5 - 51.0 % 45.3 47.9 46.0  PLT 150 - 450 x10E3/uL 296 301 369  NEUTROABS 1.4 - 7.0 x10E3/uL 4.5 4.9 4.7  LYMPHSABS 0.7 - 3.1 x10E3/uL 2.0 1.9 1.8     There is no  height or weight on file to calculate BMI.  Orders:  No orders of the defined types were placed in this encounter.  No orders of the defined types were placed in this encounter.    Procedures: No procedures performed  Clinical Data: No additional findings.  ROS:  All other systems negative, except as noted in the HPI. Review of Systems  Objective: Vital Signs: There were no vitals taken for this visit.  Specialty Comments:  No specialty comments available.  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

## 2020-12-22 ENCOUNTER — Other Ambulatory Visit: Payer: Self-pay

## 2020-12-22 ENCOUNTER — Ambulatory Visit
Admission: RE | Admit: 2020-12-22 | Discharge: 2020-12-22 | Disposition: A | Discharge status: Home / Self Care | Payer: Medicare HMO | Source: Ambulatory Visit | Attending: Specialist | Admitting: Specialist

## 2020-12-22 DIAGNOSIS — M546 Pain in thoracic spine: Secondary | ICD-10-CM | POA: Diagnosis not present

## 2020-12-22 DIAGNOSIS — M4326 Fusion of spine, lumbar region: Secondary | ICD-10-CM | POA: Diagnosis not present

## 2020-12-22 DIAGNOSIS — M5126 Other intervertebral disc displacement, lumbar region: Secondary | ICD-10-CM | POA: Diagnosis not present

## 2020-12-22 DIAGNOSIS — M48061 Spinal stenosis, lumbar region without neurogenic claudication: Secondary | ICD-10-CM | POA: Diagnosis not present

## 2020-12-22 DIAGNOSIS — M545 Low back pain, unspecified: Secondary | ICD-10-CM

## 2020-12-22 DIAGNOSIS — M4802 Spinal stenosis, cervical region: Secondary | ICD-10-CM | POA: Diagnosis not present

## 2020-12-22 MED ORDER — IOPAMIDOL (ISOVUE-M 300) INJECTION 61%
10.0000 mL | Freq: Once | INTRAMUSCULAR | Status: AC
  Administered 2020-12-22: 10 mL via INTRATHECAL

## 2020-12-22 MED ORDER — MEPERIDINE HCL 50 MG/ML IJ SOLN: 50.0000 mg | Freq: Once | INTRAMUSCULAR | Status: DC | PRN

## 2020-12-22 MED ORDER — DIAZEPAM 5 MG PO TABS
5.0000 mg | ORAL_TABLET | Freq: Once | ORAL | Status: AC
  Administered 2020-12-22: 5 mg via ORAL

## 2020-12-22 MED ORDER — ONDANSETRON HCL 4 MG/2ML IJ SOLN: 4.0000 mg | Freq: Once | INTRAMUSCULAR | Status: DC | PRN

## 2020-12-22 NOTE — Discharge Instructions (Signed)

## 2020-12-29 ENCOUNTER — Ambulatory Visit: Payer: Medicare HMO | Admitting: Specialist

## 2020-12-29 ENCOUNTER — Encounter: Payer: Self-pay | Admitting: Specialist

## 2020-12-29 ENCOUNTER — Other Ambulatory Visit: Payer: Self-pay

## 2020-12-29 VITALS — BP 122/74 | HR 82 | Ht 66.0 in | Wt 180.0 lb

## 2020-12-29 DIAGNOSIS — M48062 Spinal stenosis, lumbar region with neurogenic claudication: Secondary | ICD-10-CM | POA: Diagnosis not present

## 2020-12-29 DIAGNOSIS — M4014 Other secondary kyphosis, thoracic region: Secondary | ICD-10-CM

## 2020-12-29 DIAGNOSIS — M47814 Spondylosis without myelopathy or radiculopathy, thoracic region: Secondary | ICD-10-CM

## 2020-12-29 DIAGNOSIS — M5136 Other intervertebral disc degeneration, lumbar region: Secondary | ICD-10-CM | POA: Diagnosis not present

## 2020-12-29 MED ORDER — DULOXETINE HCL 20 MG PO CPEP: 20.0000 mg | ORAL_CAPSULE | Freq: Every day | ORAL | 2 refills | Status: DC

## 2020-12-29 NOTE — Progress Notes (Signed)
Office Visit Note   Patient: Henry [name]           Date of Birth: 01-Jun-1945           MRN: [id number] Visit Date: 12/29/2020              Requested by: Horald Pollen, [place],  [place] [address] PCP: Horald Pollen, MD   Assessment & Plan: Visit Diagnoses:  1. Degenerative disc disease, lumbar   2. Spinal stenosis of lumbar region with neurogenic claudication   3. Other secondary kyphosis, thoracic region   4. Spondylosis of thoracic spine     Plan: Avoid bending, stooping and avoid lifting weights greater than 10 lbs. Avoid prolong standing and walking. Avoid frequent bending and stooping  No lifting greater than 10 lbs. May use ice or moist heat for pain. Weight loss is of benefit. Handicap license is approved. Exercising in a flexed posture sitting and stomach crunches, leg lifts. Intermittant use of a corset when pain is severe.  Weight loss, arthritis medications and exercise. Well padded shoes help. Ice the knee 2-3 times a day 15-20 mins at a time.-3 times a day 15-20 mins at a time. Hot showers in the AM.  Injection with steroid may be of benefit. Hemp CBD capsules, amazon.com 5,000-7,000 mg per bottle, 60 capsules per bottle, take one capsule twice a day. Cane in the left hand to use with left leg weight bearing. Cymbalta 20 mg per day is approved for use to treat arthritis.  Follow-Up Instructions: No follow-ups on file.    Follow-Up Instructions: No follow-ups on file.   Orders:  No orders of the defined types were placed in this encounter.  No orders of the defined types were placed in this encounter.     Procedures: No procedures performed   Clinical Data: No additional findings.   Subjective: Chief Complaint  Patient presents with  . Middle Back - Follow-up    Myelogram review  . Lower Back - Follow-up    Myelogram review    75 year old male with neurogenic claudication and pain and decreasing  standing and walking tolerance. He has pain with prolong standing and walking. He is leaning on grocery carts and does not wish to consider surgery. He underwent a lumbar myelogram and post myelogram CT scan of the lumbar area and Thoracic spine. No bowel or bladder difficulty. With myelogram he had numbness and tingling worsen into the legs. Saw Wilfr[facility] last time and Myelogram was done.   Review of Systems  Constitutional: Negative.   HENT: Negative.    Eyes: Negative.   Respiratory: Negative.    Cardiovascular: Negative.   Gastrointestinal: Negative.   Endocrine: Negative.   Genitourinary: Negative.   Musculoskeletal: Negative.   Skin: Negative.   Allergic/Immunologic: Negative.   Neurological: Negative.   Hematological: Negative.   Psychiatric/Behavioral: Negative.      Objective: Vital Signs: BP 122/74 (BP Location: Right Arm, Patient Position: Sitting)   Pulse 82   Ht '5\' 6"'$  (1.676 m)   Wt 180 lb (81.6 kg)   BMI 29.05 kg/m   Physical Exam Constitutional:      Appearance: He is well-developed.  HENT:     Head: Normocephalic and atraumatic.  Eyes:     Pupils: Pupils are equal, round, and reactive to light.  Pulmonary:     Effort: Pulmonary effort is normal.     Breath sounds: Normal breath sounds.  Abdominal:  General: Bowel sounds are normal.     Palpations: Abdomen is soft.  Musculoskeletal:     Cervical back: Normal range of motion and neck supple.     Lumbar back: Negative right straight leg raise test and negative left straight leg raise test.  Skin:    General: Skin is warm and dry.  Neurological:     Mental Status: He is alert and oriented to person, place, and time.  Psychiatric:        Behavior: Behavior normal.        Thought Content: Thought content normal.        Judgment: Judgment normal.   Back Exam   Tenderness  The patient is experiencing tenderness in the lumbar and thoracic.  Range of Motion  Extension:  abnormal  Flexion:  abnormal   Lateral bend right:  abnormal  Lateral bend left:  abnormal  Rotation right:  abnormal  Rotation left:  abnormal   Muscle Strength  Right Quadriceps:  5/5  Left Quadriceps:  5/5  Right Hamstrings:  5/5  Left Hamstrings:  5/5   Tests  Straight leg raise right: negative Straight leg raise left: negative  Reflexes  Patellar:  0/4 Achilles:  0/4 Babinski's sign: normal   Other  Toe walk: normal Heel walk: normal Gait: normal   Comments:  Left SLR is tight and right is also. Right total ankle replacement.    Specialty Comments:  No specialty comments available.  Imaging: No results found.   PMFS History: Patient Active Problem List   Diagnosis Date Noted  . Irritable bowel syndrome with diarrhea 09/07/2016  . Iron deficiency anemia due to chronic blood loss 09/07/2016  . Hypothyroidism due to acquired atrophy of thyroid 08/15/2016  . Degenerative disc disease, lumbar 03/16/2014  . Insomnia 03/16/2014  . Chronic pain syndrome 03/16/2014  . ARTERIOVENOUS MALFORMATION 07/16/2009  . Essential hypertension 07/19/2007   Past Medical History:  Diagnosis Date  . Anxiety   . Arthritis   . Degenerative disc disease, lumbar   . Hypertension   . Hypothyroidism   . 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 Heart disease Mother        CAD and CHF    Past Surgical History:  Procedure Laterality Date  . CHOLECYSTECTOMY    . COLON SURGERY  08/07/2006   colostomy & small bowel resection  . FRACTURE SURGERY     left shoulder  . SPINE SURGERY    . 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 Not on file  Tobacco Use  . Smoking status: Never  . Smokeless tobacco: Never  Substance and Sexual Activity  . Alcohol use: Not on file  . Drug use: Not on file  . Sexual activity: Yes

## 2020-12-29 NOTE — Patient Instructions (Addendum)
Plan: Avoid bending, stooping and avoid lifting weights greater than 10 lbs. Avoid prolong standing and walking. Avoid frequent bending and stooping  No lifting greater than 10 lbs. May use ice or moist heat for pain. Weight loss is of benefit. Handicap license is approved. Exercising in a flexed posture sitting and stomach crunches, leg lifts. Intermittant use of a corset when pain is severe.  Weight loss, arthritis medications and exercise. Well padded shoes help. Ice the knee 2-3 times a day 15-20 mins at a time.-3 times a day 15-20 mins at a time. Hot showers in the AM.  Injection with steroid may be of benefit. Hemp CBD capsules, amazon[employer]com 5,000-7,000 mg per bottle, 60 capsules per bottle, take one capsule twice a day. Cane in the left hand to use with left leg weight bearing. Cymbalta 20 mg per day is approved for use to treat arthritis.  Follow-Up Instructions: No follow-ups on file.

## 2021-01-04 ENCOUNTER — Ambulatory Visit (INDEPENDENT_AMBULATORY_CARE_PROVIDER_SITE_OTHER): Payer: Self-pay | Admitting: *Deleted

## 2021-01-10 ENCOUNTER — Other Ambulatory Visit: Payer: Self-pay | Admitting: Emergency Medicine

## 2021-01-10 DIAGNOSIS — F5104 Psychophysiologic insomnia: Secondary | ICD-10-CM

## 2021-01-27 ENCOUNTER — Other Ambulatory Visit: Payer: Self-pay | Admitting: Specialist

## 2021-03-07 ENCOUNTER — Other Ambulatory Visit: Payer: Self-pay | Admitting: Emergency Medicine

## 2021-03-07 DIAGNOSIS — F5104 Psychophysiologic insomnia: Secondary | ICD-10-CM

## 2021-03-07 DIAGNOSIS — R399 Unspecified symptoms and signs involving the genitourinary system: Secondary | ICD-10-CM

## 2021-05-05 ENCOUNTER — Other Ambulatory Visit: Payer: Self-pay | Admitting: Emergency Medicine

## 2021-05-05 DIAGNOSIS — F5104 Psychophysiologic insomnia: Secondary | ICD-10-CM

## 2021-06-06 ENCOUNTER — Other Ambulatory Visit: Payer: Self-pay | Admitting: Emergency Medicine

## 2021-06-06 DIAGNOSIS — E034 Atrophy of thyroid (acquired): Secondary | ICD-10-CM

## 2021-06-06 DIAGNOSIS — I10 Essential (primary) hypertension: Secondary | ICD-10-CM

## 2021-07-04 ENCOUNTER — Other Ambulatory Visit: Payer: Self-pay | Admitting: Emergency Medicine

## 2021-07-04 DIAGNOSIS — F5104 Psychophysiologic insomnia: Secondary | ICD-10-CM

## 2021-07-11 ENCOUNTER — Ambulatory Visit: Payer: Medicare HMO

## 2021-07-11 ENCOUNTER — Telehealth: Payer: Self-pay

## 2021-07-11 NOTE — Telephone Encounter (Signed)
Called patient x2 no answer , patient may reschedule for next available . ? ?L.Philbert Ocallaghan,LPN  ?

## 2021-08-03 ENCOUNTER — Emergency Department (HOSPITAL_COMMUNITY)
Admission: EM | Admit: 2021-08-03 | Discharge: 2021-08-03 | Disposition: A | Discharge status: Home / Self Care | Payer: Medicare HMO | Attending: Emergency Medicine | Admitting: Emergency Medicine

## 2021-08-03 ENCOUNTER — Other Ambulatory Visit: Payer: Self-pay

## 2021-08-03 ENCOUNTER — Emergency Department (HOSPITAL_COMMUNITY): Payer: Medicare HMO

## 2021-08-03 ENCOUNTER — Encounter (HOSPITAL_COMMUNITY): Payer: Self-pay

## 2021-08-03 DIAGNOSIS — R112 Nausea with vomiting, unspecified: Secondary | ICD-10-CM | POA: Insufficient documentation

## 2021-08-03 DIAGNOSIS — R079 Chest pain, unspecified: Secondary | ICD-10-CM | POA: Insufficient documentation

## 2021-08-03 DIAGNOSIS — N400 Enlarged prostate without lower urinary tract symptoms: Secondary | ICD-10-CM | POA: Diagnosis not present

## 2021-08-03 DIAGNOSIS — J9811 Atelectasis: Secondary | ICD-10-CM | POA: Diagnosis not present

## 2021-08-03 DIAGNOSIS — R109 Unspecified abdominal pain: Secondary | ICD-10-CM | POA: Diagnosis not present

## 2021-08-03 DIAGNOSIS — N281 Cyst of kidney, acquired: Secondary | ICD-10-CM | POA: Diagnosis not present

## 2021-08-03 DIAGNOSIS — R1084 Generalized abdominal pain: Secondary | ICD-10-CM | POA: Diagnosis not present

## 2021-08-03 DIAGNOSIS — K769 Liver disease, unspecified: Secondary | ICD-10-CM | POA: Diagnosis not present

## 2021-08-03 LAB — TROPONIN I (HIGH SENSITIVITY)
Troponin I (High Sensitivity): 17 ng/L (ref <18)
Troponin I (High Sensitivity): 23 ng/L — ABNORMAL HIGH (ref <18)

## 2021-08-03 LAB — CBC
HCT: 47.6 % (ref 39.0–52.0)
Hemoglobin: 16 g/dL (ref 13.0–17.0)
MCH: 27.9 pg (ref 26.0–34.0)
MCHC: 33.6 g/dL (ref 30.0–36.0)
MCV: 82.9 fL (ref 80.0–100.0)
Platelets: 284 10*3/uL (ref 150–400)
RBC: 5.74 MIL/uL (ref 4.22–5.81)
RDW: 12.9 % (ref 11.5–15.5)
WBC: 8.4 10*3/uL (ref 4.0–10.5)
nRBC: 0 % (ref 0.0–0.2)

## 2021-08-03 LAB — URINALYSIS, ROUTINE W REFLEX MICROSCOPIC
Bilirubin Urine: NEGATIVE
Glucose, UA: NEGATIVE mg/dL
Ketones, ur: 20 mg/dL — AB
Leukocytes,Ua: NEGATIVE
Nitrite: NEGATIVE
Protein, ur: NEGATIVE mg/dL
Specific Gravity, Urine: >1.046 — ABNORMAL HIGH (ref 1.005–1.030)
pH: 5 (ref 5.0–8.0)

## 2021-08-03 LAB — COMPREHENSIVE METABOLIC PANEL
ALT: 20 U/L (ref 0–44)
AST: 18 U/L (ref 15–41)
Albumin: 3.8 g/dL (ref 3.5–5.0)
Alkaline Phosphatase: 66 U/L (ref 38–126)
Anion gap: 14 (ref 5–15)
BUN: 15 mg/dL (ref 8–23)
CO2: 21 mmol/L — ABNORMAL LOW (ref 22–32)
Calcium: 10.3 mg/dL (ref 8.9–10.3)
Chloride: 106 mmol/L (ref 98–111)
Creatinine, Ser: 1.08 mg/dL (ref 0.61–1.24)
GFR, Estimated: >60 mL/min (ref >60)
Glucose, Bld: 127 mg/dL — ABNORMAL HIGH (ref 70–99)
Potassium: 3.4 mmol/L — ABNORMAL LOW (ref 3.5–5.1)
Sodium: 141 mmol/L (ref 135–145)
Total Bilirubin: 1.6 mg/dL — ABNORMAL HIGH (ref 0.3–1.2)
Total Protein: 6.9 g/dL (ref 6.5–8.1)

## 2021-08-03 LAB — LIPASE, BLOOD: Lipase: 36 U/L (ref 11–51)

## 2021-08-03 MED ORDER — OXYCODONE HCL 5 MG PO TABS
5.0000 mg | ORAL_TABLET | ORAL | 0 refills | Status: AC | PRN
Start: 2021-08-03 — End: 2021-08-13

## 2021-08-03 MED ORDER — MORPHINE SULFATE (PF) 4 MG/ML IV SOLN
4.0000 mg | Freq: Once | INTRAVENOUS | Status: AC
  Administered 2021-08-03: 4 mg via INTRAVENOUS
  Filled 2021-08-03: qty 1

## 2021-08-03 MED ORDER — KETOROLAC TROMETHAMINE 15 MG/ML IJ SOLN
15.0000 mg | Freq: Once | INTRAMUSCULAR | Status: AC
  Administered 2021-08-03: 15 mg via INTRAVENOUS
  Filled 2021-08-03 (×2): qty 1

## 2021-08-03 MED ORDER — ONDANSETRON HCL 4 MG/2ML IJ SOLN
4.0000 mg | Freq: Once | INTRAMUSCULAR | Status: AC
  Administered 2021-08-03: 4 mg via INTRAVENOUS
  Filled 2021-08-03: qty 2

## 2021-08-03 MED ORDER — IOHEXOL 300 MG/ML  SOLN
100.0000 mL | Freq: Once | INTRAMUSCULAR | Status: AC | PRN
  Administered 2021-08-03: 100 mL via INTRAVENOUS

## 2021-08-03 MED ORDER — ONDANSETRON 4 MG PO TBDP: 4.0000 mg | ORAL_TABLET | Freq: Once | ORAL | Status: DC

## 2021-08-03 MED ORDER — ONDANSETRON HCL 4 MG PO TABS
4.0000 mg | ORAL_TABLET | Freq: Four times a day (QID) | ORAL | 0 refills | Status: DC
Start: 2021-08-03 — End: 2022-12-12

## 2021-08-03 NOTE — Discharge Instructions (Addendum)
Follow-up with your primary care doctor.  Return if symptoms worsen. 

## 2021-08-03 NOTE — ED Notes (Signed)
Patient transported to radiology

## 2021-08-03 NOTE — ED Provider Triage Note (Signed)
Emergency Medicine Provider Triage Evaluation Note ? ?Vale Haven , a 76 y.o. male  was evaluated in triage.  Pt complains of abdominal and some chest pain that started today.  Says he has a history of hernias and diverticulitis and that this feels just like his diverticulitis attack.  Says he is still having normal bowel movements, no melena or hematochezia.  Reports chronic back pain.  No history of ACS. ? ?Review of Systems  ?Positive: Nausea, vomiting, abdominal pain and chest pain ?Negative: SOB ? ?Physical Exam  ?BP (!) 133/100 (BP Location: Right Arm)   Pulse 96   Temp 97.8 ?F (36.6 ?C) (Oral)   Resp 20   SpO2 99%  ?Gen:   Awake, patient dry heaving in triage ?Resp:  Normal effort  ?MSK:   Moves extremities without difficulty  ?Other:  Well-healed vertical scar in patient's abdomen that he reports is from colonectomy.  Periumbilical hernia laterally to this scar ? ?Medical Decision Making  ?Medically screening exam initiated at 4:28 PM.  Appropriate orders placed.  Vale Haven was informed that the remainder of the evaluation will be completed by another provider, this initial triage assessment does not replace that evaluation, and the importance of remaining in the ED until their evaluation is complete. ? ? ? ?Cardiac and abdominal pain work-up ordered. ?  ?Rhae Hammock, PA-C ?08/03/21 1641 ? ?

## 2021-08-03 NOTE — ED Triage Notes (Signed)
Pt arrived POV from home c/o abdominal pain and chest pain that started this morning. Pt is feeling nauseous and afraid he messed previous hernia surgeries up.   ?

## 2021-08-03 NOTE — ED Provider Notes (Signed)
?[facility] ?Provider Note ? ? ?CSN: [id number] ?Arrival date & time: 08/03/21  1619 ? ?  ? ?History ? ?Chief Complaint  ?Patient presents with  ? Chest Pain  ? Abdominal Pain  ? ? ?Henry [name] is a 76 y.o. male. ? ?The history is provided by the patient.  ?Abdominal Pain ?Pain location:  Generalized ?Pain quality: aching   ?Pain radiates to:  Does not radiate ?Pain severity:  Mild ?Onset quality:  Gradual ?Chronicity:  New ?Relieved by:  Nothing ?Worsened by:  Nothing ?Associated symptoms: no anorexia, no belching, no chest pain, no chills, no constipation, no cough, no diarrhea, no dysuria, no fatigue, no fever, no flatus, no hematuria, no melena, no nausea, no shortness of breath, no sore throat and no vomiting   ?Risk factors: multiple surgeries (mutliple colon surgeries)   ? ?  ? ?Home Medications ?Prior to Admission medications   ?Medication Sig Start Date End Date Taking? Authorizing Provider  ?ondansetron (ZOFRAN) 4 MG tablet Take 1 tablet (4 mg total) by mouth every 6 (six) hours. 08/03/21  Yes Markis Langland, DO  ?oxyCODONE (ROXICODONE) 5 MG immediate release tablet Take 1 tablet (5 mg total) by mouth every 4 (four) hours as needed for up to 10 days for breakthrough pain. 08/03/21 08/13/21 Yes Jahad Old, DO  ?allopurinol (ZYLOPRIM) 100 MG tablet TAKE 1 TABLET BY MOUTH TWICE A DAY 09/30/20   Persons, Bevely Palmer, PA  ?benazepril-hydrochlorthiazide (LOTENSIN HCT) 20-25 MG tablet TAKE 1 TABLET BY MOUTH EVERY DAY 06/06/21   Horald Pollen, MD  ?benzonatate (TESSALON PERLES) 100 MG capsule Take 1 capsule (100 mg total) by mouth 3 (three) times daily as needed. 10/05/20   Lucretia Kern, DO  ?cholecalciferol (VITAMIN D) 1000 UNITS tablet Take 1,000 Units by mouth daily.    [provider]  ?colchicine 0.6 MG tablet As needed for gout flair 07/02/20   Persons, Bevely Palmer, PA  ?diclofenac sodium (VOLTAREN) 1 % GEL Apply 4 g topically 4 (four) times daily.  04/06/17   Jessy Oto, MD  ?DULoxetine (CYMBALTA) 20 MG capsule TAKE 1 CAPSULE BY MOUTH EVERY DAY 01/27/21   Jessy Oto, MD  ?fish oil-omega-3 fatty acids 1000 MG capsule Take 2 g by mouth daily.    [provider]  ?levothyroxine (SYNTHROID) 125 MCG tablet TAKE 1 TABLET BY MOUTH DAILY BEFORE BREAKFAST. 06/06/21   Horald Pollen, MD  ?LORazepam (ATIVAN) 1 MG tablet Take 1 tablet (1 mg total) by mouth daily as needed. for anxiety 05/18/20   Horald Pollen, MD  ?traMADol (ULTRAM) 50 MG tablet Take 1 tablet (50 mg total) by mouth every 6 (six) hours as needed for up to 7 days. 05/22/18 05/29/18  Jessy Oto, MD  ?zolpidem (AMBIEN) 10 MG tablet TAKE 1 TABLET (10 MG TOTAL) BY MOUTH AT BEDTIME AS NEEDED FOR SLEEP. SHOULD NOT BE TAKEN EVERY DAY. 07/05/21   Horald Pollen, MD  ?   ? ?Allergies    ?Ciprofloxacin   ? ?Review of Systems   ?Review of Systems  ?Constitutional:  Negative for chills, fatigue and fever.  ?HENT:  Negative for sore throat.   ?Respiratory:  Negative for cough and shortness of breath.   ?Cardiovascular:  Negative for chest pain.  ?Gastrointestinal:  Negative for anorexia, constipation, diarrhea, flatus, melena, nausea and vomiting.  ?Genitourinary:  Negative for dysuria and hematuria.  ? ?Physical Exam ?Updated Vital Signs ?BP 138/66   Pulse 87  Temp 98.7 ?F (37.1 ?C) (Oral)   Resp 17   Ht '5\' 6"'$  (1.676 m)   Wt 83.9 kg   SpO2 100%   BMI 29.86 kg/m?  ?Physical Exam ?Vitals and nursing note reviewed.  ?Constitutional:   ?   General: He is not in acute distress. ?   Appearance: He is well-developed.  ?HENT:  ?   Head: Normocephalic and atraumatic.  ?Eyes:  ?   Extraocular Movements: Extraocular movements intact.  ?   Conjunctiva/sclera: Conjunctivae normal.  ?   Pupils: Pupils are equal, round, and reactive to light.  ?Cardiovascular:  ?   Rate and Rhythm: Normal rate and regular rhythm.  ?   Heart sounds: No murmur heard. ?Pulmonary:  ?   Effort: Pulmonary effort  is normal. No respiratory distress.  ?   Breath sounds: Normal breath sounds. No decreased breath sounds or wheezing.  ?Abdominal:  ?   General: There is no abdominal bruit.  ?   Palpations: Abdomen is soft.  ?   Tenderness: There is abdominal tenderness. There is guarding.  ?Musculoskeletal:     ?   General: No swelling.  ?   Cervical back: Neck supple.  ?Skin: ?   General: Skin is warm and dry.  ?   Capillary Refill: Capillary refill takes less than 2 seconds.  ?Neurological:  ?   General: No focal deficit present.  ?   Mental Status: He is alert.  ?Psychiatric:     ?   Mood and Affect: Mood normal.  ? ? ?ED Results / Procedures / Treatments   ?Labs ?(all labs ordered are listed, but only abnormal results are displayed) ?Labs Reviewed  ?COMPREHENSIVE METABOLIC PANEL - Abnormal; Notable for the following components:  ?    Result Value  ? Potassium 3.4 (*)   ? CO2 21 (*)   ? Glucose, Bld 127 (*)   ? Total Bilirubin 1.6 (*)   ? All other components within normal limits  ?URINALYSIS, ROUTINE W REFLEX MICROSCOPIC - Abnormal; Notable for the following components:  ? Specific Gravity, Urine >1.046 (*)   ? Hgb urine dipstick SMALL (*)   ? Ketones, ur 20 (*)   ? Bacteria, UA RARE (*)   ? All other components within normal limits  ?TROPONIN I (HIGH SENSITIVITY) - Abnormal; Notable for the following components:  ? Troponin I (High Sensitivity) 23 (*)   ? All other components within normal limits  ?CBC  ?LIPASE, BLOOD  ?TROPONIN I (HIGH SENSITIVITY)  ? ? ?EKG ?EKG Interpretation ? ?Date/Time:  Wednesday August 03 2021 16:27:52 EDT ?Ventricular Rate:  75 ?PR Interval:  150 ?QRS Duration: 78 ?QT Interval:  384 ?QTC Calculation: 428 ?R Axis:   12 ?Text Interpretation: Normal sinus rhythm with sinus arrhythmia When compared with ECG of 24-Dec-2004 10:16, PREVIOUS ECG IS PRESENT Confirmed by Lennice Sites [phone]) on 08/03/2021 6:56:12 PM ? ?Radiology ?CT ABDOMEN PELVIS W CONTRAST ? ?Result Date: 08/03/2021 ?CLINICAL DATA:  Abdominal  pain EXAM: CT ABDOMEN AND PELVIS WITH CONTRAST TECHNIQUE: Multidetector CT imaging of the abdomen and pelvis was performed using the standard protocol following bolus administration of intravenous contrast. RADIATION DOSE REDUCTION: This exam was performed according to the departmental dose-optimization program which includes automated exposure control, adjustment of the mA and/or kV according to patient size and/or use of iterative reconstruction technique. CONTRAST:  156m OMNIPAQUE IOHEXOL 300 MG/ML  SOLN COMPARISON:  CT abdomen pelvis dated January 14, 2020 FINDINGS: Lower chest: No acute abnormality. Hepatobiliary:  Scattered low-attenuation liver lesions which are unchanged with compared with prior exam and likely simple cysts. No suspicious liver lesions. Gallbladder is surgically absent. No biliary ductal dilation. Pancreas: Unremarkable. No pancreatic ductal dilatation or surrounding inflammatory changes. Spleen: Unchanged splenic cyst.  Spleen is normal in size. Adrenals/Urinary Tract: Bilateral adrenal glands are unremarkable. No hydronephrosis or nephrolithiasis. Hyperdense cyst of the lower pole the left kidney, previously characterized on prior CT and demonstrated no evidence of enhancement, compatible with a minimally complex cyst. Additional simple cyst of the mid region and upper pole the left kidney. No further follow-up is recommended for renal lesions. Bladder is unremarkable. Stomach/Bowel: Small hiatal hernia. Stomach is otherwise unremarkable. Normal appendix. Prior small bowel resection. No bowel wall thickening, inflammatory change or evidence of obstruction. Vascular/Lymphatic: Aortic atherosclerosis. No enlarged abdominal or pelvic lymph nodes. Radiologist extra speaking Reproductive: Prostatomegaly, measuring up to 5.7 cm. Other: Rectus diastasis. No free fluid or free air seen in the abdomen. Musculoskeletal: Posterior fusion of L5-S1. No aggressive appearing osseous lesions.  IMPRESSION: 1. No acute findings in the abdomen or pelvis. 2.  Aortic Atherosclerosis (ICD10-I70.0). Electronically Signed   By: Yetta Glassman M.D.   On: 08/03/2021 18:30  ? ?DG Chest Portable 1 View ? ?Result Date: 4/19

## 2021-08-03 NOTE — ED Notes (Signed)
Dc instructions reviewed with pt and family.  Both verbalized understanding. PT DC.  

## 2021-08-11 DIAGNOSIS — R3912 Poor urinary stream: Secondary | ICD-10-CM | POA: Diagnosis not present

## 2021-08-11 DIAGNOSIS — Z125 Encounter for screening for malignant neoplasm of prostate: Secondary | ICD-10-CM | POA: Diagnosis not present

## 2021-08-11 DIAGNOSIS — R350 Frequency of micturition: Secondary | ICD-10-CM | POA: Diagnosis not present

## 2021-08-11 DIAGNOSIS — R3915 Urgency of urination: Secondary | ICD-10-CM | POA: Diagnosis not present

## 2021-08-11 DIAGNOSIS — N401 Enlarged prostate with lower urinary tract symptoms: Secondary | ICD-10-CM | POA: Diagnosis not present

## 2021-08-11 DIAGNOSIS — R351 Nocturia: Secondary | ICD-10-CM | POA: Diagnosis not present

## 2021-08-30 ENCOUNTER — Other Ambulatory Visit: Payer: Self-pay | Admitting: Emergency Medicine

## 2021-08-30 DIAGNOSIS — F5104 Psychophysiologic insomnia: Secondary | ICD-10-CM

## 2021-09-01 ENCOUNTER — Telehealth: Payer: Self-pay | Admitting: Emergency Medicine

## 2021-09-01 ENCOUNTER — Other Ambulatory Visit: Payer: Self-pay | Admitting: Emergency Medicine

## 2021-09-01 DIAGNOSIS — F5104 Psychophysiologic insomnia: Secondary | ICD-10-CM

## 2021-09-01 MED ORDER — ZOLPIDEM TARTRATE 10 MG PO TABS: 10.0000 mg | ORAL_TABLET | Freq: Every evening | ORAL | 1 refills | Status: DC | PRN

## 2021-09-01 NOTE — Telephone Encounter (Signed)
Called patient to inform him that his rx was sent to his pharmacy

## 2021-09-01 NOTE — Telephone Encounter (Signed)
PT calls today in regards to getting an appointment set up with Dr.Sagardia. I had set up the appointment regarding his Zolpidem (AMBIEN). If possible they would like this sent out to the [facility] on [address] that's on file.  I had seen where medicine was denied and PT expressed that he required this medication for daily use due to medical conditions.  CB: [phone]

## 2021-09-01 NOTE — Telephone Encounter (Signed)
New prescription sent to pharmacy of record.  Thanks.

## 2021-09-07 ENCOUNTER — Ambulatory Visit (INDEPENDENT_AMBULATORY_CARE_PROVIDER_SITE_OTHER): Payer: Medicare HMO | Admitting: Emergency Medicine

## 2021-09-07 ENCOUNTER — Encounter: Payer: Self-pay | Admitting: Emergency Medicine

## 2021-09-07 VITALS — BP 120/62 | HR 84 | Temp 97.9°F | Ht 66.0 in | Wt 171.2 lb

## 2021-09-07 DIAGNOSIS — I10 Essential (primary) hypertension: Secondary | ICD-10-CM

## 2021-09-07 DIAGNOSIS — E039 Hypothyroidism, unspecified: Secondary | ICD-10-CM | POA: Diagnosis not present

## 2021-09-07 DIAGNOSIS — G894 Chronic pain syndrome: Secondary | ICD-10-CM | POA: Diagnosis not present

## 2021-09-07 DIAGNOSIS — E034 Atrophy of thyroid (acquired): Secondary | ICD-10-CM | POA: Diagnosis not present

## 2021-09-07 DIAGNOSIS — F321 Major depressive disorder, single episode, moderate: Secondary | ICD-10-CM | POA: Diagnosis not present

## 2021-09-07 DIAGNOSIS — R69 Illness, unspecified: Secondary | ICD-10-CM | POA: Diagnosis not present

## 2021-09-07 MED ORDER — DULOXETINE HCL 30 MG PO CPEP: 30.0000 mg | ORAL_CAPSULE | Freq: Every day | ORAL | 1 refills | Status: DC

## 2021-09-07 NOTE — Progress Notes (Signed)
Henry [name] 76 y.o.   Chief Complaint  Patient presents with   Medication Management    Pt has questions pertaining to his Ambien     HISTORY OF PRESENT ILLNESS: This is a 76 y.o. male last office visit with me over 2 years ago. Has history of chronic pain secondary to chronic musculoskeletal issues including osteoarthritis.  Needs referral to pain management. Chronic pain making him depressed.  Will need medication as well. Has history of insomnia.  Takes Ambien periodically.  Recently refilled prescription.  HPI   Prior to Admission medications   Medication Sig Start Date End Date Taking? Authorizing Provider  allopurinol (ZYLOPRIM) 100 MG tablet TAKE 1 TABLET BY MOUTH TWICE A DAY 09/30/20  Yes Persons, Bevely Palmer, PA  benazepril-hydrochlorthiazide (LOTENSIN HCT) 20-25 MG tablet TAKE 1 TABLET BY MOUTH EVERY DAY 06/06/21  Yes Jonthan Leite, Ines Bloomer, MD  benzonatate (TESSALON PERLES) 100 MG capsule Take 1 capsule (100 mg total) by mouth 3 (three) times daily as needed. 10/05/20  Yes Colin Benton R, DO  cholecalciferol (VITAMIN D) 1000 UNITS tablet Take 1,000 Units by mouth daily.   Yes [provider]  colchicine 0.6 MG tablet As needed for gout flair 07/02/20  Yes Persons, Bevely Palmer, PA  diclofenac sodium (VOLTAREN) 1 % GEL Apply 4 g topically 4 (four) times daily. 04/06/17  Yes Jessy Oto, MD  DULoxetine (CYMBALTA) 20 MG capsule TAKE 1 CAPSULE BY MOUTH EVERY DAY 01/27/21  Yes Jessy Oto, MD  fish oil-omega-3 fatty acids 1000 MG capsule Take 2 g by mouth daily.   Yes [provider]  levothyroxine (SYNTHROID) 125 MCG tablet TAKE 1 TABLET BY MOUTH DAILY BEFORE BREAKFAST. 06/06/21  Yes Marrian Bells, Ines Bloomer, MD  LORazepam (ATIVAN) 1 MG tablet Take 1 tablet (1 mg total) by mouth daily as needed. for anxiety 05/18/20  Yes Marriana Hibberd, Ines Bloomer, MD  ondansetron (ZOFRAN) 4 MG tablet Take 1 tablet (4 mg total) by mouth every 6 (six) hours. 08/03/21  Yes Curatolo, Adam,  DO  zolpidem (AMBIEN) 10 MG tablet Take 1 tablet (10 mg total) by mouth at bedtime as needed for sleep. Should not be taken every day. 09/01/21  Yes Niang Mit[place]ltree, Ines Bloomer, MD  traMADol (ULTRAM) 50 MG tablet Take 1 tablet (50 mg total) by mouth every 6 (six) hours as needed for up to 7 days. 05/22/18 05/29/18  Jessy Oto, MD    Allergies  Allergen Reactions   Ciprofloxacin Itching and Rash    Patient Active Problem List   Diagnosis Date Noted   Irritable bowel syndrome with diarrhea 09/07/2016   Iron deficiency anemia due to chronic blood loss 09/07/2016   Hypothyroidism due to acquired atrophy of thyroid 08/15/2016   Degenerative disc disease, lumbar 03/16/2014   Insomnia 03/16/2014   Chronic pain syndrome 03/16/2014   ARTERIOVENOUS MALFORMATION 07/16/2009   Essential hypertension 07/19/2007    Past Medical History:  Diagnosis Date   Anxiety    Arthritis    Degenerative disc disease, lumbar    Hypertension    Hypothyroidism    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   CR[place]AL[place]- [place] MOHS    Past Surgical History:  Procedure Laterality Date   CHOLECYSTECTOMY     COLON SURGERY  08/07/2006   colostomy & small bowel resection   FRACTURE SURGERY     left shoulder   SPINE SURGERY     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    Social History   Socioeconomic  History   Marital status: Married    Spouse name: Not on file   Number of children: Not on file   Years of education: Not on file   Highest education level: Not on file  Occupational History   Not on file  Tobacco Use   Smoking status: Never   Smokeless tobacco: Never  Substance and Sexual Activity   Alcohol use: Not on file   Drug use: Not on file   Sexual activity: Yes  Other Topics Concern   Not on file  Social History Narrative   Not on file   Social Determinants of Health   Financial Resource Strain: Not on file  Food Insecurity: Not on file  Transportation Needs: Not on file  Physical  Activity: Not on file  Stress: Not on file  Social Connections: Not on file  Intimate Partner Violence: Not on file    Family History  Problem Relation Age of Onset   Heart disease Mother        CAD and CHF     Review of Systems  Constitutional: Negative.  Negative for chills and fever.  HENT: Negative.    Respiratory: Negative.  Negative for cough and shortness of breath.   Cardiovascular:  Negative for chest pain and palpitations.  Gastrointestinal:  Positive for abdominal pain.  Genitourinary: Negative.   Musculoskeletal:  Positive for back pain, joint pain and neck pain.  Skin: Negative.  Negative for rash.  Neurological: Negative.  Negative for dizziness and headaches.  All other systems reviewed and are negative.  Today's Vitals   09/07/21 1345  BP: 120/62  Pulse: 84  Temp: 97.9 F (36.6 C)  TempSrc: Oral  SpO2: 98%  Weight: 171 lb 4 oz (77.7 kg)  Height: '5\' 6"'$  (1.676 m)   Body mass index is 27.64 kg/m.  Physical Exam Vitals reviewed.  Constitutional:      Appearance: Normal appearance.  HENT:     Head: Normocephalic.  Eyes:     Extraocular Movements: Extraocular movements intact.     Pupils: Pupils are equal, round, and reactive to light.  Cardiovascular:     Rate and Rhythm: Normal rate and regular rhythm.     Pulses: Normal pulses.     Heart sounds: Normal heart sounds.  Pulmonary:     Effort: Pulmonary effort is normal.     Breath sounds: Normal breath sounds.  Abdominal:     Palpations: Abdomen is soft.     Tenderness: There is no abdominal tenderness.     Comments: Multiple old surgical scars  Musculoskeletal:     Cervical back: No tenderness.  Lymphadenopathy:     Cervical: No cervical adenopathy.  Neurological:     General: No focal deficit present.     Mental Status: Henry [name] is alert and oriented to person, place, and time.  Psychiatric:        Mood and Affect: Mood normal.        Behavior: Behavior normal.     ASSESSMENT & PLAN: A  total of 50 minutes was spent with the patient and counseling/coordination of care regarding preparing for this visit, review of most recent office visit notes, review of all medications, review of multiple chronic medical problems under management, education on nutrition, need for pain management evaluation and treatment, need to start medication for depression, prognosis, documentation and need for follow-up.  Problem List Items Addressed This Visit       Cardiovascular and Mediastinum   Essential hypertension  Well-controlled hypertension. BP Readings from Last 3 Encounters:  09/07/21 120/62  08/03/21 138/66  12/29/20 122/74  Continue Lotensin-HCTZ 20-25 mg daily.         Endocrine   Hypothyroidism due to acquired atrophy of thyroid   Hypothyroidism    Clinically euthyroid.  Continue Synthroid 125 mcg daily.         Other   Chronic pain syndrome - Primary    Chronic and affecting quality of life. Needs to follow-up with pain management clinic. States Henry [name] has problem ingesting pills.       Relevant Medications   DULoxetine (CYMBALTA) 30 MG capsule   Other Relevant Orders   Ambulatory referral to Pain Clinic   Current moderate episode of major depressive disorder without prior episode ([place])    Aggravated by chronic pain syndrome. May benefit from starting Cymbalta 30 mg daily.       Relevant Medications   DULoxetine (CYMBALTA) 30 MG capsule   Patient Instructions  Chronic Pain, Adult Chronic pain is a type of pain that lasts or keeps coming back for at least 3-6 months. You may have headaches, pain in the abdomen, or pain in other areas of the body. Chronic pain may be related to an illness, such as fibromyalgia or complex regional pain syndrome. Chronic pain may also be related to an injury or a health condition. Sometimes, the cause of chronic pain is not known. Chronic pain can make it hard for you to do daily activities. If not treated, chronic pain can lead to  anxiety and depression. Treatment depends on the cause and severity of your pain. You may need to work with a pain specialist to come up with a treatment plan. The plan may include medicine, counseling, and physical therapy. Many people benefit from a combination of two or more types of treatment to control their pain. Follow these instructions at home: Medicines Take over-the-counter and prescription medicines only as told by your health care provider. Ask your health care provider if the medicine prescribed to you: Requires you to avoid driving or using machinery. Can cause constipation. You may need to take these actions to prevent or treat constipation: Drink enough fluid to keep your urine pale yellow. Take over-the-counter or prescription medicines. Eat foods that are high in fiber, such as beans, whole grains, and fresh fruits and vegetables. Limit foods that are high in fat and processed sugars, such as fried or sweet foods. Treatment plan Follow your treatment plan as told by your health care provider. This may include: Gentle, regular exercise. Eating a healthy diet that includes foods such as vegetables, fruits, fish, and lean meats. Cognitive or behavioral therapy that changes the way you think or act in response to the pain. This may help improve how you feel. Working with a physical therapist. Meditation, yoga, acupuncture, or massage therapy. Aroma, color, light, or sound therapy. Local electrical stimulation. The electrical pulses help to relieve pain by temporarily stopping the nerve impulses that cause you to feel pain. Injections. These deliver numbing or pain-relieving medicines into the spine or the area of pain.  Lifestyle  Ask your health care provider whether you should keep a pain diary. Your health care provider will tell you what information to write in the diary. This may include when you have pain, what the pain feels like, and how medicines and other behaviors or  treatments help to reduce the pain. Consider talking with a mental health care provider about how to manage chronic  pain. Consider joining a chronic pain support group. Try to control or lower your stress levels. Talk with your health care provider about ways to do this. General instructions Learn as much as you can about how to manage your chronic pain. Ask your health care provider if an intensive pain rehabilitation program or a chronic pain specialist would be helpful. Check your pain level as told by your health care provider. Ask your health care provider if you should use a pain scale. It is up to you to get the results of any tests that were done. Ask your health care provider, or the department that is doing the tests, when your results will be ready. Keep all follow-up visits as told by your health care provider. This is important. Contact a health care provider if: Your pain gets worse, or you have new pain. You have trouble sleeping. You have trouble doing your normal activities. Your pain is not controlled with treatment. You have side effects from pain medicine. You feel weak. You notice any other changes that show that your condition is getting worse. Get help right away if: You lose feeling or have numbness in your body. You lose control of bowel or bladder function. Your pain suddenly gets much worse. You develop shaking or chills. You develop confusion. You develop chest pain. You have trouble breathing or shortness of breath. You pass out. You have thoughts about hurting yourself or others. If you ever feel like you may hurt yourself or others, or have thoughts about taking your own life, get help right away. Go to your nearest emergency department or: Call your local emergency services (911 in the [place] Call a suicide crisis helpline, such as the [place] at (432) 192-6951 or 988 in the [place]. This is open 24 hours a day in the [place] Text  the Crisis Text Line at 509-829-5610 (in the [place].). Summary Chronic pain is a type of pain that lasts or keeps coming back for at least 3-6 months. Chronic pain may be related to an illness, injury, or other health condition. Sometimes, the cause of chronic pain is not known. Treatment depends on the cause and severity of your pain. Many people benefit from a combination of two or more types of treatment to control their pain. Follow your treatment plan as told by your health care provider. This information is not intended to replace advice given to you by your health care provider. Make sure you discuss any questions you have with your health care provider. Document Revised: 10/27/2020 Document Reviewed: 12/19/2018 Elsevier Patient Education  [place], MD [facility]

## 2021-09-07 NOTE — Assessment & Plan Note (Signed)
Well-controlled hypertension. BP Readings from Last 3 Encounters:  09/07/21 120/62  08/03/21 138/66  12/29/20 122/74  Continue Lotensin-HCTZ 20-25 mg daily.

## 2021-09-07 NOTE — Assessment & Plan Note (Signed)
Clinically euthyroid. Continue Synthroid 125 mcg daily. 

## 2021-09-07 NOTE — Assessment & Plan Note (Signed)
Aggravated by chronic pain syndrome. May benefit from starting Cymbalta 30 mg daily.

## 2021-09-07 NOTE — Assessment & Plan Note (Signed)
Chronic and affecting quality of life. Needs to follow-up with pain management clinic. States he has problem ingesting pills.

## 2021-09-07 NOTE — Patient Instructions (Signed)

## 2021-09-08 DIAGNOSIS — R351 Nocturia: Secondary | ICD-10-CM | POA: Diagnosis not present

## 2021-09-08 DIAGNOSIS — N5201 Erectile dysfunction due to arterial insufficiency: Secondary | ICD-10-CM | POA: Diagnosis not present

## 2021-09-08 DIAGNOSIS — R350 Frequency of micturition: Secondary | ICD-10-CM | POA: Diagnosis not present

## 2021-09-08 DIAGNOSIS — N401 Enlarged prostate with lower urinary tract symptoms: Secondary | ICD-10-CM | POA: Diagnosis not present

## 2021-09-22 DIAGNOSIS — H26493 Other secondary cataract, bilateral: Secondary | ICD-10-CM | POA: Diagnosis not present

## 2021-10-28 ENCOUNTER — Other Ambulatory Visit: Payer: Self-pay | Admitting: Emergency Medicine

## 2021-10-28 DIAGNOSIS — F5104 Psychophysiologic insomnia: Secondary | ICD-10-CM

## 2021-11-08 ENCOUNTER — Telehealth: Payer: Self-pay | Admitting: Emergency Medicine

## 2021-11-08 NOTE — Telephone Encounter (Signed)
LVM for pt to rtn my call to schedule AWV with [facility] call back # [phone] 

## 2021-11-24 DIAGNOSIS — Z961 Presence of intraocular lens: Secondary | ICD-10-CM | POA: Diagnosis not present

## 2021-11-24 DIAGNOSIS — H18413 Arcus senilis, bilateral: Secondary | ICD-10-CM | POA: Diagnosis not present

## 2021-11-24 DIAGNOSIS — H26493 Other secondary cataract, bilateral: Secondary | ICD-10-CM | POA: Diagnosis not present

## 2021-11-24 DIAGNOSIS — H26491 Other secondary cataract, right eye: Secondary | ICD-10-CM | POA: Diagnosis not present

## 2021-11-24 DIAGNOSIS — H02834 Dermatochalasis of left upper eyelid: Secondary | ICD-10-CM | POA: Diagnosis not present

## 2021-12-08 DIAGNOSIS — H26492 Other secondary cataract, left eye: Secondary | ICD-10-CM | POA: Diagnosis not present

## 2021-12-29 ENCOUNTER — Other Ambulatory Visit: Payer: Self-pay | Admitting: Emergency Medicine

## 2021-12-29 DIAGNOSIS — F5104 Psychophysiologic insomnia: Secondary | ICD-10-CM

## 2022-02-21 ENCOUNTER — Ambulatory Visit: Payer: Medicare HMO | Admitting: Emergency Medicine

## 2022-02-26 ENCOUNTER — Other Ambulatory Visit: Payer: Self-pay | Admitting: Emergency Medicine

## 2022-02-26 DIAGNOSIS — F5104 Psychophysiologic insomnia: Secondary | ICD-10-CM

## 2022-03-28 ENCOUNTER — Other Ambulatory Visit: Payer: Self-pay | Admitting: Emergency Medicine

## 2022-03-28 DIAGNOSIS — E034 Atrophy of thyroid (acquired): Secondary | ICD-10-CM

## 2022-03-28 DIAGNOSIS — R399 Unspecified symptoms and signs involving the genitourinary system: Secondary | ICD-10-CM

## 2022-04-11 ENCOUNTER — Telehealth: Payer: Self-pay | Admitting: Emergency Medicine

## 2022-04-11 NOTE — Telephone Encounter (Signed)
LVM informing patient that his appt on 04/12/22 was re-scheduled to 05/05/21 due to the [facility] being out of the office sick for the rest of the week.

## 2022-04-12 ENCOUNTER — Ambulatory Visit: Payer: Medicare HMO

## 2022-04-26 ENCOUNTER — Other Ambulatory Visit: Payer: Self-pay | Admitting: Emergency Medicine

## 2022-04-26 DIAGNOSIS — I10 Essential (primary) hypertension: Secondary | ICD-10-CM

## 2022-04-26 DIAGNOSIS — F5104 Psychophysiologic insomnia: Secondary | ICD-10-CM

## 2022-05-05 ENCOUNTER — Ambulatory Visit: Payer: Medicare HMO

## 2022-05-11 DIAGNOSIS — D0461 Carcinoma in situ of skin of right upper limb, including shoulder: Secondary | ICD-10-CM | POA: Diagnosis not present

## 2022-05-11 DIAGNOSIS — L821 Other seborrheic keratosis: Secondary | ICD-10-CM | POA: Diagnosis not present

## 2022-05-11 DIAGNOSIS — D1801 Hemangioma of skin and subcutaneous tissue: Secondary | ICD-10-CM | POA: Diagnosis not present

## 2022-05-11 DIAGNOSIS — D3617 Benign neoplasm of peripheral nerves and autonomic nervous system of trunk, unspecified: Secondary | ICD-10-CM | POA: Diagnosis not present

## 2022-05-11 DIAGNOSIS — L820 Inflamed seborrheic keratosis: Secondary | ICD-10-CM | POA: Diagnosis not present

## 2022-05-11 DIAGNOSIS — Z85828 Personal history of other malignant neoplasm of skin: Secondary | ICD-10-CM | POA: Diagnosis not present

## 2022-05-11 DIAGNOSIS — C44622 Squamous cell carcinoma of skin of right upper limb, including shoulder: Secondary | ICD-10-CM | POA: Diagnosis not present

## 2022-05-11 DIAGNOSIS — L570 Actinic keratosis: Secondary | ICD-10-CM | POA: Diagnosis not present

## 2022-06-01 DIAGNOSIS — R3912 Poor urinary stream: Secondary | ICD-10-CM | POA: Diagnosis not present

## 2022-06-01 DIAGNOSIS — N401 Enlarged prostate with lower urinary tract symptoms: Secondary | ICD-10-CM | POA: Diagnosis not present

## 2022-06-01 DIAGNOSIS — R351 Nocturia: Secondary | ICD-10-CM | POA: Diagnosis not present

## 2022-06-19 DIAGNOSIS — R351 Nocturia: Secondary | ICD-10-CM | POA: Diagnosis not present

## 2022-06-19 DIAGNOSIS — R350 Frequency of micturition: Secondary | ICD-10-CM | POA: Diagnosis not present

## 2022-06-19 DIAGNOSIS — R3915 Urgency of urination: Secondary | ICD-10-CM | POA: Diagnosis not present

## 2022-06-25 ENCOUNTER — Other Ambulatory Visit: Payer: Self-pay | Admitting: Emergency Medicine

## 2022-06-25 DIAGNOSIS — F5104 Psychophysiologic insomnia: Secondary | ICD-10-CM

## 2022-07-13 ENCOUNTER — Telehealth: Payer: Self-pay

## 2022-07-13 ENCOUNTER — Ambulatory Visit (INDEPENDENT_AMBULATORY_CARE_PROVIDER_SITE_OTHER): Payer: Medicare HMO

## 2022-07-13 VITALS — BP 124/80 | HR 69 | Temp 98.0°F | Ht 66.0 in | Wt 180.0 lb

## 2022-07-13 DIAGNOSIS — Z0000 Encounter for general adult medical examination without abnormal findings: Secondary | ICD-10-CM

## 2022-07-13 NOTE — Telephone Encounter (Addendum)
Patient is requesting several refills to be sent to [facility]  Last office visit was 09/07/2021. Please contact patient to schedule with PCP or NP for refills.  Also patient is requesting referral to pain management.  Henry [name] N. Breslin Hemann, LPN. [place] Team Direct Dial: [phone]

## 2022-07-13 NOTE — Progress Notes (Signed)
Subjective:   Henry [name] is a 77 y.o. male who presents for Medicare Annual/Subsequent preventive examination.  Review of Systems     Cardiac Risk Factors include: advanced age (>69men, >26 women);family history of premature cardiovascular disease;hypertension;male gender;sedentary lifestyle     Objective:    Today's Vitals   07/13/22 0829  BP: 124/80  Pulse: 69  Temp: 98 F (36.7 C)  TempSrc: Oral  SpO2: 97%  Weight: 180 lb (81.6 kg)  Height: 5\' 6"  (1.676 m)  PainSc: 7   PainLoc: Generalized   Body mass index is 29.05 kg/m.     07/13/2022    9:51 AM 06/11/2019    9:13 AM 02/20/2014    9:47 AM  Advanced Directives  Does Patient Have a Medical Advance Directive? Yes Yes No  Type of Paramedic of [place];Living will [place] in Chart? No - copy requested No - copy requested     Current Medications (verified) Outpatient Encounter Medications as of 07/13/2022  Medication Sig   allopurinol (ZYLOPRIM) 100 MG tablet TAKE 1 TABLET BY MOUTH TWICE A DAY   benazepril-hydrochlorthiazide (LOTENSIN HCT) 20-25 MG tablet TAKE 1 TABLET BY MOUTH EVERY DAY   benzonatate (TESSALON PERLES) 100 MG capsule Take 1 capsule (100 mg total) by mouth 3 (three) times daily as needed. (Patient not taking: Reported on 07/13/2022)   cholecalciferol (VITAMIN D) 1000 UNITS tablet Take 1,000 Units by mouth daily.   colchicine 0.6 MG tablet As needed for gout flair   diclofenac sodium (VOLTAREN) 1 % GEL Apply 4 g topically 4 (four) times daily.   DULoxetine (CYMBALTA) 30 MG capsule Take 1 capsule (30 mg total) by mouth daily.   fish oil-omega-3 fatty acids 1000 MG capsule Take 2 g by mouth daily.   levothyroxine (SYNTHROID) 125 MCG tablet TAKE 1 TABLET BY MOUTH EVERY DAY BEFORE BREAKFAST   LORazepam (ATIVAN) 1 MG tablet Take 1 tablet (1 mg total) by mouth daily as needed. for anxiety   ondansetron (ZOFRAN) 4 MG  tablet Take 1 tablet (4 mg total) by mouth every 6 (six) hours.   tamsulosin (FLOMAX) 0.4 MG CAPS capsule TAKE 2 CAPSULES (0.8 MG TOTAL) BY MOUTH DAILY. TAKE 1 CAPSULE BY MOUTH DAILY AFTER BREAKFAST.   traMADol (ULTRAM) 50 MG tablet Take 1 tablet (50 mg total) by mouth every 6 (six) hours as needed for up to 7 days.   zolpidem (AMBIEN) 10 MG tablet TAKE 1 TABLET (10 MG TOTAL) BY MOUTH AT BEDTIME AS NEEDED FOR SLEEP. SHOULD NOT BE TAKEN EVERY DAY.   No facility-administered encounter medications on file as of 07/13/2022.    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   Family History  Problem Relation Age of Onset   Heart disease Mother        CAD and CHF   Social History   Socioeconomic History   Marital status: Married  Spouse name: Not on file   Number of children: Not on file   Years of education: Not on file   Highest education level: Not on file  Occupational History   Not on file  Tobacco Use   Smoking status: Never   Smokeless tobacco: Never  Substance and Sexual Activity   Alcohol use: Not on file   Drug use: Not on file   Sexual activity: Yes  Other Topics Concern   Not on file  Social History Narrative   Not on file   Social Determinants of Health   Financial Resource Strain: Low Risk  (07/13/2022)   Overall Financial Resource Strain (CARDIA)    Difficulty of Paying Living Expenses: Not hard at all  Food Insecurity: No Food Insecurity (07/13/2022)   Hunger Vital Sign    Worried About Running Out of Food in the Last  Year: Never true    [place] in the Last Year: Never true  Transportation Needs: No Transportation Needs (07/13/2022)   PRAPARE - Hydrologist (Medical): No    Lack of Transportation (Non-Medical): No  Physical Activity: Inactive (07/13/2022)   Exercise Vital Sign    Days of Exercise per Week: 0 days    Minutes of Exercise per Session: 0 min  Stress: No Stress Concern Present (07/13/2022)   [place]    Feeling of Stress : Not at all  Social Connections: [place] (07/13/2022)   Social Connection and Isolation Panel [NHANES]    Frequency of Communication with Friends and Family: More than three times a week    Frequency of Social Gatherings with Friends and Family: More than three times a week    Attends Religious Services: More than 4 times per year    Active Member of [employer] or Organizations: Yes    Attends Music therapist: More than 4 times per year    Marital Status: Married    Tobacco Counseling Counseling given: Not Answered   Clinical Intake:  Pre-visit preparation completed: Yes  Pain : No/denies pain Pain Score: 7      BMI - recorded: 29.05 Nutritional Status: BMI 25 -29 Overweight Nutritional Risks: None Diabetes: No  How often do you need to have someone help you when you read instructions, pamphlets, or other written materials from your doctor or pharmacy?: 1 - Never What is the last grade level you completed in school?: HSG  Diabetic? No  Interpreter Needed?: No  Information entered by :: Lisette Abu, LPN.   Activities of Daily Living    07/13/2022   10:56 AM  In your present state of health, do you have any difficulty performing the following activities:  Hearing? 0  Vision? 0  Difficulty concentrating or making decisions? 0  Walking or climbing stairs? 1  Dressing or bathing? 0  Doing errands, shopping? 0  Preparing  Food and eating ? N  Using the Toilet? N  In the past six months, have you accidently leaked urine? N  Do you have problems with loss of bowel control? N  Managing your Medications? N  Managing your Finances? N  Housekeeping or managing your Housekeeping? N    Patient Care Team: Horald Pollen, MD as PCP - General (Internal Medicine)  Indicate any recent Medical Services you may have received from other than [facility] providers in the past year (date may be approximate).     Assessment:  This is a routine wellness examination for [place].  Hearing/Vision screen Hearing Screening - Comments:: Denies hearing difficulties   Vision Screening - Comments:: Wears rx glasses - up to date with routine eye exams with Laurence Aly, OD and Darleen Crocker, MD.   Dietary issues and exercise activities discussed: Current Exercise Habits: The patient does not participate in regular exercise at present, Exercise limited by: orthopedic condition(s);psychological condition(s)   Goals Addressed             This Visit's Progress    Client understands the importance of follow-up with providers by attending scheduled visits        Depression Screen    07/13/2022    9:02 AM 09/07/2021    1:46 PM 05/18/2020    4:12 PM 06/11/2019    9:15 AM 03/04/2019   11:14 AM 10/22/2018    9:59 AM 07/17/2018   11:14 AM  PHQ 2/9 Scores  PHQ - 2 Score 1 6 0 0 0 0 0  PHQ- 9 Score 3 21         Fall Risk    07/13/2022   10:56 AM 09/07/2021    1:46 PM 05/18/2020    4:12 PM 06/11/2019    9:15 AM 03/04/2019   11:14 AM  [place] in the past year? 0 0 0 0 0  Number falls in past yr: 0  0 0   Injury with Fall? 0  0 0   Risk for fall due to : No Fall Risks      Follow up Falls prevention discussed  Falls evaluation completed Falls evaluation completed;Education provided Falls evaluation completed    FALL RISK PREVENTION PERTAINING TO THE HOME:  Any stairs in or around the home? No  If so, are there any  without handrails? No  Home free of loose throw rugs in walkways, pet beds, electrical cords, etc? Yes  Adequate lighting in your home to reduce risk of falls? Yes   ASSISTIVE DEVICES UTILIZED TO PREVENT FALLS:  Life alert? No  Use of a cane, walker or w/c? Yes  Grab bars in the bathroom? Yes  Shower chair or bench in shower? Yes  Elevated toilet seat or a handicapped toilet? Yes   TIMED UP AND GO:  Was the test performed? Yes .  Length of time to ambulate 10 feet: 8 sec.   Gait steady and fast without use of assistive device  Cognitive Function:        07/13/2022   10:57 AM 06/11/2019    9:13 AM 06/11/2019    9:12 AM  6CIT Screen  What Year? 0 points 0 points 0 points  What month? 0 points 0 points 0 points  What time? 0 points 0 points 0 points  Count back from 20 0 points 0 points 0 points  Months in reverse 0 points 0 points 0 points  Repeat phrase 0 points 0 points 0 points  Total Score 0 points 0 points 0 points    Immunizations Immunization History  Administered Date(s) Administered   PFIZER(Purple Top)SARS-COV-2 Vaccination 06/18/2019, 07/16/2019   Td 04/17/2004   Zoster Recombinat (Shingrix) 02/04/2018, 04/11/2018   Zoster, Live 04/11/2018    TDAP status: Due, Education has been provided regarding the importance of this vaccine. Advised may receive this vaccine at local pharmacy or Health Dept. Aware to provide a copy of the vaccination record if obtained from local pharmacy or Health Dept. Verbalized acceptance and understanding.  Flu Vaccine  status: Declined, Education has been provided regarding the importance of this vaccine but patient still declined. Advised may receive this vaccine at local pharmacy or Health Dept. Aware to provide a copy of the vaccination record if obtained from local pharmacy or Health Dept. Verbalized acceptance and understanding.  Pneumococcal vaccine status: Declined,  Education has been provided regarding the importance of this  vaccine but patient still declined. Advised may receive this vaccine at local pharmacy or Health Dept. Aware to provide a copy of the vaccination record if obtained from local pharmacy or Health Dept. Verbalized acceptance and understanding.   Covid-19 vaccine status: Declined, Education has been provided regarding the importance of this vaccine but patient still declined. Advised may receive this vaccine at local pharmacy or Health Dept.or vaccine clinic. Aware to provide a copy of the vaccination record if obtained from local pharmacy or Health Dept. Verbalized acceptance and understanding.  Qualifies for Shingles Vaccine? Yes   Zostavax completed Yes   Shingrix Completed?: Yes  Screening Tests Health Maintenance  Topic Date Due   Pneumonia Vaccine 3+ Years old (1 of 1 - PCV) Never done   DTaP/Tdap/Td (2 - Tdap) 04/17/2014   INFLUENZA VACCINE  Never done   COVID-19 Vaccine (3 - 2023-24 season) 12/16/2021   Medicare Annual Wellness (AWV)  07/13/2023   COLONOSCOPY (Pts 45-81yrs Insurance coverage will need to be confirmed)  06/26/2024   Hepatitis C Screening  Completed   Zoster Vaccines- Shingrix  Completed   HPV VACCINES  Aged Out    Health Maintenance  Health Maintenance Due  Topic Date Due   Pneumonia Vaccine 70+ Years old (1 of 1 - PCV) Never done   DTaP/Tdap/Td (2 - Tdap) 04/17/2014   INFLUENZA VACCINE  Never done   COVID-19 Vaccine (3 - 2023-24 season) 12/16/2021    Colorectal cancer screening: Type of screening: Colonoscopy. Completed 06/27/2014. Repeat every 10 years  Lung Cancer Screening: (Low Dose CT Chest recommended if Age 46-80 years, 30 pack-year currently smoking OR have quit w/in 15years.) does not qualify.   Lung Cancer Screening Referral: no  Additional Screening:  Hepatitis C Screening: does qualify; Completed 08/22/2016  Vision Screening: Recommended annual ophthalmology exams for early detection of glaucoma and other disorders of the eye. Is the patient  up to date with their annual eye exam?  Yes  Who is the provider or what is the name of the office in which the patient attends annual eye exams? Laurence Aly, OD and Darleen Crocker, MD. If pt is not established with a provider, would they like to be referred to a provider to establish care? No .   Dental Screening: Recommended annual dental exams for proper oral hygiene  Community Resource Referral / Chronic Care Management: CRR required this visit?  No   CCM required this visit?  No      Plan:     I have personally reviewed and noted the following in the patient's chart:   Medical and social history Use of alcohol, tobacco or illicit drugs  Current medications and supplements including opioid prescriptions. Patient is not currently taking opioid prescriptions. Functional ability and status Nutritional status Physical activity Advanced directives List of other physicians Hospitalizations, surgeries, and ER visits in previous 12 months Vitals Screenings to include cognitive, depression, and falls Referrals and appointments  In addition, I have reviewed and discussed with patient certain preventive protocols, quality metrics, and best practice recommendations. A written personalized care plan for preventive services as well as general  preventive health recommendations were provided to patient.     Sheral Flow, LPN   [id number]   Nurse Notes:  Normal cognitive status assessed by direct observation by this Nurse Health Advisor. No abnormalities found.

## 2022-07-13 NOTE — Patient Instructions (Addendum)
Henry [name] , Thank you for taking time to come for your Medicare Wellness Visit. I appreciate your ongoing commitment to your health goals. Please review the following plan we discussed and let me know if I can assist you in the future.   These are the goals we discussed:  Goals      Client understands the importance of follow-up with providers by attending scheduled visits        This is a list of the screening recommended for you and due dates:  Health Maintenance  Topic Date Due   Pneumonia Vaccine (1 of 1 - PCV) Never done   DTaP/Tdap/Td vaccine (2 - Tdap) 04/17/2014   Flu Shot  Never done   COVID-19 Vaccine (3 - 2023-24 season) 12/16/2021   Medicare Annual Wellness Visit  07/13/2023   Colon Cancer Screening  06/26/2024   Hepatitis C Screening: USPSTF Recommendation to screen - Ages 18-79 yo.  Completed   Zoster (Shingles) Vaccine  Completed   HPV Vaccine  Aged Out    Advanced directives: Yes  Conditions/risks identified: Yes  Next appointment: Follow up in one year for your annual wellness visit.   Preventive Care 75 Years and Older, Male  Preventive care refers to lifestyle choices and visits with your health care provider that can promote health and wellness. What does preventive care include? A yearly physical exam. This is also called an annual well check. Dental exams once or twice a year. Routine eye exams. Ask your health care provider how often you should have your eyes checked. Personal lifestyle choices, including: Daily care of your teeth and gums. Regular physical activity. Eating a healthy diet. Avoiding tobacco and drug use. Limiting alcohol use. Practicing safe sex. Taking low doses of aspirin every day. Taking vitamin and mineral supplements as recommended by your health care provider. What happens during an annual well check? The services and screenings done by your health care provider during your annual well check will depend on your age,  overall health, lifestyle risk factors, and family history of disease. Counseling  Your health care provider may ask you questions about your: Alcohol use. Tobacco use. Drug use. Emotional well-being. Home and relationship well-being. Sexual activity. Eating habits. History of falls. Memory and ability to understand (cognition). Work and work Statistician. Screening  You may have the following tests or measurements: Height, weight, and BMI. Blood pressure. Lipid and cholesterol levels. These may be checked every 5 years, or more frequently if you are over 75 years old. Skin check. Lung cancer screening. You may have this screening every year starting at age 75 if you have a 30-pack-year history of smoking and currently smoke or have quit within the past 15 years. Fecal occult blood test (FOBT) of the stool. You may have this test every year starting at age 75. Flexible sigmoidoscopy or colonoscopy. You may have a sigmoidoscopy every 5 years or a colonoscopy every 10 years starting at age 75. Prostate cancer screening. Recommendations will vary depending on your family history and other risks. Hepatitis C blood test. Hepatitis B blood test. Sexually transmitted disease (STD) testing. Diabetes screening. This is done by checking your blood sugar (glucose) after you have not eaten for a while (fasting). You may have this done every 1-3 years. Abdominal aortic aneurysm (AAA) screening. You may need this if you are a current or former smoker. Osteoporosis. You may be screened starting at age 75 if you are at high risk. Talk with your health care  provider about your test results, treatment options, and if necessary, the need for more tests. Vaccines  Your health care provider may recommend certain vaccines, such as: Influenza vaccine. This is recommended every year. Tetanus, diphtheria, and acellular pertussis (Tdap, Td) vaccine. You may need a Td booster every 10 years. Zoster vaccine.  You may need this after age 75. Pneumococcal 13-valent conjugate (PCV13) vaccine. One dose is recommended after age 75. Pneumococcal polysaccharide (PPSV23) vaccine. One dose is recommended after age 75. Talk to your health care provider about which screenings and vaccines you need and how often you need them. This information is not intended to replace advice given to you by your health care provider. Make sure you discuss any questions you have with your health care provider. Document Released: 04/30/2015 Document Revised: 12/22/2015 Document Reviewed: 02/02/2015 Elsevier Interactive Patient Education  2017 [employer] Prevention in the Home Falls can cause injuries. They can happen to people of all ages. There are many things you can do to make your home safe and to help prevent falls. What can I do on the outside of my home? Regularly fix the edges of walkways and driveways and fix any cracks. Remove anything that might make you trip as you walk through a door, such as a raised step or threshold. Trim any bushes or trees on the path to your home. Use bright outdoor lighting. Clear any walking paths of anything that might make someone trip, such as rocks or tools. Regularly check to see if handrails are loose or broken. Make sure that both sides of any steps have handrails. Any raised decks and porches should have guardrails on the edges. Have any leaves, snow, or ice cleared regularly. Use sand or salt on walking paths during winter. Clean up any spills in your garage right away. This includes oil or grease spills. What can I do in the bathroom? Use night lights. Install grab bars by the toilet and in the tub and shower. Do not use towel bars as grab bars. Use non-skid mats or decals in the tub or shower. If you need to sit down in the shower, use a plastic, non-slip stool. Keep the floor dry. Clean up any water that spills on the floor as soon as it happens. Remove soap  buildup in the tub or shower regularly. Attach bath mats securely with double-sided non-slip rug tape. Do not have throw rugs and other things on the floor that can make you trip. What can I do in the bedroom? Use night lights. Make sure that you have a light by your bed that is easy to reach. Do not use any sheets or blankets that are too big for your bed. They should not hang down onto the floor. Have a firm chair that has side arms. You can use this for support while you get dressed. Do not have throw rugs and other things on the floor that can make you trip. What can I do in the kitchen? Clean up any spills right away. Avoid walking on wet floors. Keep items that you use a lot in easy-to-reach places. If you need to reach something above you, use a strong step stool that has a grab bar. Keep electrical cords out of the way. Do not use floor polish or wax that makes floors slippery. If you must use wax, use non-skid floor wax. Do not have throw rugs and other things on the floor that can make you trip. What can I  do with my stairs? Do not leave any items on the stairs. Make sure that there are handrails on both sides of the stairs and use them. Fix handrails that are broken or loose. Make sure that handrails are as long as the stairways. Check any carpeting to make sure that it is firmly attached to the stairs. Fix any carpet that is loose or worn. Avoid having throw rugs at the top or bottom of the stairs. If you do have throw rugs, attach them to the floor with carpet tape. Make sure that you have a light switch at the top of the stairs and the bottom of the stairs. If you do not have them, ask someone to add them for you. What else can I do to help prevent falls? Wear shoes that: Do not have high heels. Have rubber bottoms. Are comfortable and fit you well. Are closed at the toe. Do not wear sandals. If you use a stepladder: Make sure that it is fully opened. Do not climb a closed  stepladder. Make sure that both sides of the stepladder are locked into place. Ask someone to hold it for you, if possible. Clearly mark and make sure that you can see: Any grab bars or handrails. First and last steps. Where the edge of each step is. Use tools that help you move around (mobility aids) if they are needed. These include: Canes. Walkers. Scooters. Crutches. Turn on the lights when you go into a dark area. Replace any light bulbs as soon as they burn out. Set up your furniture so you have a clear path. Avoid moving your furniture around. If any of your floors are uneven, fix them. If there are any pets around you, be aware of where they are. Review your medicines with your doctor. Some medicines can make you feel dizzy. This can increase your chance of falling. Ask your doctor what other things that you can do to help prevent falls. This information is not intended to replace advice given to you by your health care provider. Make sure you discuss any questions you have with your health care provider. Document Released: 01/28/2009 Document Revised: 09/09/2015 Document Reviewed: 05/08/2014 Elsevier Interactive Patient Education  2017 [employer].

## 2022-07-14 NOTE — Telephone Encounter (Signed)
What medications is he requesting?  He was referred to pain management clinic during our last visit in May 2023.

## 2022-07-17 ENCOUNTER — Other Ambulatory Visit: Payer: Self-pay | Admitting: Emergency Medicine

## 2022-07-17 DIAGNOSIS — F411 Generalized anxiety disorder: Secondary | ICD-10-CM

## 2022-07-17 MED ORDER — LORAZEPAM 1 MG PO TABS: 1.0000 mg | ORAL_TABLET | Freq: Every day | ORAL | 1 refills | Status: DC | PRN

## 2022-07-17 NOTE — Telephone Encounter (Signed)
Spoke with PT this morning and he verified one of the prescriptions is for the LORazepam (ATIVAN) 1 MG tablet .

## 2022-07-17 NOTE — Telephone Encounter (Signed)
New prescription sent to pharmacy of record today.  Thanks.

## 2022-07-27 DIAGNOSIS — R3912 Poor urinary stream: Secondary | ICD-10-CM | POA: Diagnosis not present

## 2022-07-27 DIAGNOSIS — R350 Frequency of micturition: Secondary | ICD-10-CM | POA: Diagnosis not present

## 2022-07-27 DIAGNOSIS — R351 Nocturia: Secondary | ICD-10-CM | POA: Diagnosis not present

## 2022-07-27 DIAGNOSIS — N5201 Erectile dysfunction due to arterial insufficiency: Secondary | ICD-10-CM | POA: Diagnosis not present

## 2022-07-27 DIAGNOSIS — N401 Enlarged prostate with lower urinary tract symptoms: Secondary | ICD-10-CM | POA: Diagnosis not present

## 2022-08-23 ENCOUNTER — Other Ambulatory Visit: Payer: Self-pay | Admitting: Emergency Medicine

## 2022-08-23 DIAGNOSIS — F5104 Psychophysiologic insomnia: Secondary | ICD-10-CM

## 2022-09-21 DIAGNOSIS — M961 Postlaminectomy syndrome, not elsewhere classified: Secondary | ICD-10-CM | POA: Diagnosis not present

## 2022-09-21 DIAGNOSIS — M5416 Radiculopathy, lumbar region: Secondary | ICD-10-CM | POA: Diagnosis not present

## 2022-09-21 DIAGNOSIS — G8929 Other chronic pain: Secondary | ICD-10-CM | POA: Diagnosis not present

## 2022-09-22 ENCOUNTER — Other Ambulatory Visit: Payer: Self-pay | Admitting: Physical Medicine & Rehabilitation

## 2022-09-22 DIAGNOSIS — M5416 Radiculopathy, lumbar region: Secondary | ICD-10-CM

## 2022-09-29 ENCOUNTER — Ambulatory Visit
Admission: RE | Admit: 2022-09-29 | Discharge: 2022-09-29 | Disposition: A | Discharge status: Home / Self Care | Payer: Medicare HMO | Source: Ambulatory Visit | Attending: Physical Medicine & Rehabilitation | Admitting: Physical Medicine & Rehabilitation

## 2022-09-29 DIAGNOSIS — M48061 Spinal stenosis, lumbar region without neurogenic claudication: Secondary | ICD-10-CM | POA: Diagnosis not present

## 2022-09-29 DIAGNOSIS — M5416 Radiculopathy, lumbar region: Secondary | ICD-10-CM

## 2022-09-29 DIAGNOSIS — M545 Low back pain, unspecified: Secondary | ICD-10-CM | POA: Diagnosis not present

## 2022-10-16 DIAGNOSIS — M5416 Radiculopathy, lumbar region: Secondary | ICD-10-CM | POA: Diagnosis not present

## 2022-10-16 DIAGNOSIS — Z79891 Long term (current) use of opiate analgesic: Secondary | ICD-10-CM | POA: Diagnosis not present

## 2022-10-16 DIAGNOSIS — M961 Postlaminectomy syndrome, not elsewhere classified: Secondary | ICD-10-CM | POA: Diagnosis not present

## 2022-10-16 DIAGNOSIS — G8929 Other chronic pain: Secondary | ICD-10-CM | POA: Diagnosis not present

## 2022-10-17 DIAGNOSIS — H43393 Other vitreous opacities, bilateral: Secondary | ICD-10-CM | POA: Diagnosis not present

## 2022-10-20 ENCOUNTER — Other Ambulatory Visit: Payer: Self-pay | Admitting: Emergency Medicine

## 2022-10-20 DIAGNOSIS — F5104 Psychophysiologic insomnia: Secondary | ICD-10-CM

## 2022-10-20 DIAGNOSIS — F411 Generalized anxiety disorder: Secondary | ICD-10-CM

## 2022-10-26 ENCOUNTER — Other Ambulatory Visit: Payer: Medicare HMO

## 2022-10-31 ENCOUNTER — Ambulatory Visit (INDEPENDENT_AMBULATORY_CARE_PROVIDER_SITE_OTHER): Payer: Medicare HMO | Admitting: Emergency Medicine

## 2022-10-31 ENCOUNTER — Encounter: Payer: Self-pay | Admitting: Emergency Medicine

## 2022-10-31 VITALS — BP 120/84 | HR 78 | Temp 98.1°F | Ht 66.0 in | Wt 180.1 lb

## 2022-10-31 DIAGNOSIS — I10 Essential (primary) hypertension: Secondary | ICD-10-CM | POA: Diagnosis not present

## 2022-10-31 DIAGNOSIS — E034 Atrophy of thyroid (acquired): Secondary | ICD-10-CM | POA: Diagnosis not present

## 2022-10-31 DIAGNOSIS — Z13220 Encounter for screening for lipoid disorders: Secondary | ICD-10-CM | POA: Diagnosis not present

## 2022-10-31 DIAGNOSIS — Z0000 Encounter for general adult medical examination without abnormal findings: Secondary | ICD-10-CM | POA: Diagnosis not present

## 2022-10-31 DIAGNOSIS — G894 Chronic pain syndrome: Secondary | ICD-10-CM

## 2022-10-31 DIAGNOSIS — F5104 Psychophysiologic insomnia: Secondary | ICD-10-CM | POA: Diagnosis not present

## 2022-10-31 DIAGNOSIS — F411 Generalized anxiety disorder: Secondary | ICD-10-CM | POA: Diagnosis not present

## 2022-10-31 DIAGNOSIS — E039 Hypothyroidism, unspecified: Secondary | ICD-10-CM

## 2022-10-31 DIAGNOSIS — Z1329 Encounter for screening for other suspected endocrine disorder: Secondary | ICD-10-CM | POA: Diagnosis not present

## 2022-10-31 DIAGNOSIS — Z125 Encounter for screening for malignant neoplasm of prostate: Secondary | ICD-10-CM | POA: Diagnosis not present

## 2022-10-31 DIAGNOSIS — Z13228 Encounter for screening for other metabolic disorders: Secondary | ICD-10-CM

## 2022-10-31 DIAGNOSIS — Z0001 Encounter for general adult medical examination with abnormal findings: Secondary | ICD-10-CM

## 2022-10-31 DIAGNOSIS — Z130 Encounter for screening for diseases of the blood and blood-forming organs and certain disorders involving the immune mechanism: Secondary | ICD-10-CM

## 2022-10-31 DIAGNOSIS — D500 Iron deficiency anemia secondary to blood loss (chronic): Secondary | ICD-10-CM

## 2022-10-31 LAB — CBC WITH DIFFERENTIAL/PLATELET
Basophils Absolute: 0 10*3/uL (ref 0.0–0.1)
Basophils Relative: 0.7 % (ref 0.0–3.0)
Eosinophils Absolute: 0.1 10*3/uL (ref 0.0–0.7)
Eosinophils Relative: 1.9 % (ref 0.0–5.0)
HCT: 47.4 % (ref 39.0–52.0)
Hemoglobin: 15.5 g/dL (ref 13.0–17.0)
Lymphocytes Relative: 25.7 % (ref 12.0–46.0)
Lymphs Abs: 1.6 10*3/uL (ref 0.7–4.0)
MCHC: 32.7 g/dL (ref 30.0–36.0)
MCV: 85.3 fl (ref 78.0–100.0)
Monocytes Absolute: 0.6 10*3/uL (ref 0.1–1.0)
Monocytes Relative: 9.4 % (ref 3.0–12.0)
Neutro Abs: 3.9 10*3/uL (ref 1.4–7.7)
Neutrophils Relative %: 62.3 % (ref 43.0–77.0)
Platelets: 280 10*3/uL (ref 150.0–400.0)
RBC: 5.55 Mil/uL (ref 4.22–5.81)
RDW: 13.7 % (ref 11.5–15.5)
WBC: 6.3 10*3/uL (ref 4.0–10.5)

## 2022-10-31 LAB — COMPREHENSIVE METABOLIC PANEL
ALT: 10 U/L (ref 0–53)
AST: 9 U/L (ref 0–37)
Albumin: 4.2 g/dL (ref 3.5–5.2)
Alkaline Phosphatase: 60 U/L (ref 39–117)
BUN: 18 mg/dL (ref 6–23)
CO2: 26 mEq/L (ref 19–32)
Calcium: 9.9 mg/dL (ref 8.4–10.5)
Chloride: 105 mEq/L (ref 96–112)
Creatinine, Ser: 1.03 mg/dL (ref 0.40–1.50)
GFR: 71.06 mL/min (ref >60.00)
Glucose, Bld: 97 mg/dL (ref 70–99)
Potassium: 3.9 mEq/L (ref 3.5–5.1)
Sodium: 139 mEq/L (ref 135–145)
Total Bilirubin: 1.4 mg/dL — ABNORMAL HIGH (ref 0.2–1.2)
Total Protein: 6.8 g/dL (ref 6.0–8.3)

## 2022-10-31 LAB — LIPID PANEL
Cholesterol: 158 mg/dL (ref 0–200)
HDL: 39.8 mg/dL (ref >39.00)
LDL Cholesterol: 107 mg/dL — ABNORMAL HIGH (ref 0–99)
NonHDL: 117.79
Total CHOL/HDL Ratio: 4
Triglycerides: 56 mg/dL (ref 0.0–149.0)
VLDL: 11.2 mg/dL (ref 0.0–40.0)

## 2022-10-31 LAB — HEMOGLOBIN A1C: Hgb A1c MFr Bld: 5.8 % (ref 4.6–6.5)

## 2022-10-31 LAB — PSA, MEDICARE: PSA: 2.67 ng/ml (ref 0.10–4.00)

## 2022-10-31 LAB — TSH: TSH: 0.61 u[IU]/mL (ref 0.35–5.50)

## 2022-10-31 MED ORDER — LORAZEPAM 1 MG PO TABS: 1.0000 mg | ORAL_TABLET | Freq: Every day | ORAL | 1 refills | Status: DC | PRN

## 2022-10-31 MED ORDER — ZOLPIDEM TARTRATE 10 MG PO TABS: 10.0000 mg | ORAL_TABLET | Freq: Every evening | ORAL | 1 refills | Status: DC | PRN

## 2022-10-31 NOTE — Patient Instructions (Signed)
Health Maintenance, Male Adopting a healthy lifestyle and getting preventive care are important in promoting health and wellness. Ask your health care provider about: The right schedule for you to have regular tests and exams. Things you can do on your own to prevent diseases and keep yourself healthy. What should I know about diet, weight, and exercise? Eat a healthy diet  Eat a diet that includes plenty of vegetables, fruits, low-fat dairy products, and lean protein. Do not eat a lot of foods that are high in solid fats, added sugars, or sodium. Maintain a healthy weight Body mass index (BMI) is a measurement that can be used to identify possible weight problems. It estimates body fat based on height and weight. Your health care provider can help determine your BMI and help you achieve or maintain a healthy weight. Get regular exercise Get regular exercise. This is one of the most important things you can do for your health. Most adults should: Exercise for at least 150 minutes each week. The exercise should increase your heart rate and make you sweat (moderate-intensity exercise). Do strengthening exercises at least twice a week. This is in addition to the moderate-intensity exercise. Spend less time sitting. Even light physical activity can be beneficial. Watch cholesterol and blood lipids Have your blood tested for lipids and cholesterol at 75 years of age, then have this test every 5 years. You may need to have your cholesterol levels checked more often if: Your lipid or cholesterol levels are high. You are older than 75 years of age. You are at high risk for heart disease. What should I know about cancer screening? Many types of cancers can be detected early and may often be prevented. Depending on your health history and family history, you may need to have cancer screening at various ages. This may include screening for: Colorectal cancer. Prostate cancer. Skin cancer. Lung  cancer. What should I know about heart disease, diabetes, and high blood pressure? Blood pressure and heart disease High blood pressure causes heart disease and increases the risk of stroke. This is more likely to develop in people who have high blood pressure readings or are overweight. Talk with your health care provider about your target blood pressure readings. Have your blood pressure checked: Every 3-5 years if you are 18-39 years of age. Every year if you are 40 years old or older. If you are between the ages of 65 and 75 and are a current or former smoker, ask your health care provider if you should have a one-time screening for abdominal aortic aneurysm (AAA). Diabetes Have regular diabetes screenings. This checks your fasting blood sugar level. Have the screening done: Once every three years after age 45 if you are at a normal weight and have a low risk for diabetes. More often and at a younger age if you are overweight or have a high risk for diabetes. What should I know about preventing infection? Hepatitis B If you have a higher risk for hepatitis B, you should be screened for this virus. Talk with your health care provider to find out if you are at risk for hepatitis B infection. Hepatitis C Blood testing is recommended for: Everyone born from 1945 through 1965. Anyone with known risk factors for hepatitis C. Sexually transmitted infections (STIs) You should be screened each year for STIs, including gonorrhea and chlamydia, if: You are sexually active and are younger than 75 years of age. You are older than 75 years of age and your   health care provider tells you that you are at risk for this type of infection. Your sexual activity has changed since you were last screened, and you are at increased risk for chlamydia or gonorrhea. Ask your health care provider if you are at risk. Ask your health care provider about whether you are at high risk for HIV. Your health care provider  may recommend a prescription medicine to help prevent HIV infection. If you choose to take medicine to prevent HIV, you should first get tested for HIV. You should then be tested every 3 months for as long as you are taking the medicine. Follow these instructions at home: Alcohol use Do not drink alcohol if your health care provider tells you not to drink. If you drink alcohol: Limit how much you have to 0-2 drinks a day. Know how much alcohol is in your drink. In the [place] one drink equals one 12 oz bottle of beer (355 mL), one 5 oz glass of wine (148 mL), or one 1 oz glass of hard liquor (44 mL). Lifestyle Do not use any products that contain nicotine or tobacco. These products include cigarettes, chewing tobacco, and vaping devices, such as e-cigarettes. If you need help quitting, ask your health care provider. Do not use street drugs. Do not share needles. Ask your health care provider for help if you need support or information about quitting drugs. General instructions Schedule regular health, dental, and eye exams. Stay current with your vaccines. Tell your health care provider if: You often feel depressed. You have ever been abused or do not feel safe at home. Summary Adopting a healthy lifestyle and getting preventive care are important in promoting health and wellness. Follow your health care provider's instructions about healthy diet, exercising, and getting tested or screened for diseases. Follow your health care provider's instructions on monitoring your cholesterol and blood pressure. This information is not intended to replace advice given to you by your health care provider. Make sure you discuss any questions you have with your health care provider. Document Revised: 08/23/2020 Document Reviewed: 08/23/2020 Elsevier Patient Education  2024 Elsevier Inc.  

## 2022-10-31 NOTE — Assessment & Plan Note (Signed)
BP Readings from Last 3 Encounters:  10/31/22 120/84  07/13/22 124/80  09/07/21 120/62  Well-controlled hypertension Continue Lotensin HCT 20-25 mg daily Cardiovascular risks associated with hypertension discussed Diet and nutrition discussed

## 2022-10-31 NOTE — Progress Notes (Signed)
Henry [name] 77 y.o.   Chief Complaint  Patient presents with   Medical Management of Chronic Issues    Patient states he wants to have a physical done today.    Referral    Patient wants a referral to pain clinic     HISTORY OF PRESENT ILLNESS: This is a 77 y.o. male here for annual exam Has history of chronic pain.  Requesting another referral to pain management clinic Last office visit with me 09/07/2021 when he was referred to pain clinic.  Transdermal opiates failed Also sees orthopedic surgeon on a regular basis.  Has history of multiple back surgeries Has significant spine disease but surgery not recommended  HPI   Prior to Admission medications   Medication Sig Start Date End Date Taking? Authorizing Provider  benazepril-hydrochlorthiazide (LOTENSIN HCT) 20-25 MG tablet TAKE 1 TABLET BY MOUTH EVERY DAY 04/27/22  Yes Bijou Easler, Eilleen Kempf, MD  cholecalciferol (VITAMIN D) 1000 UNITS tablet Take 1,000 Units by mouth daily.   Yes [provider]  fish oil-omega-3 fatty acids 1000 MG capsule Take 2 g by mouth daily.   Yes [provider]  levothyroxine (SYNTHROID) 125 MCG tablet TAKE 1 TABLET BY MOUTH EVERY DAY BEFORE BREAKFAST 03/29/22  Yes Kie Calvin, Eilleen Kempf, MD  LORazepam (ATIVAN) 1 MG tablet TAKE 1 TABLET (1 MG TOTAL) BY MOUTH DAILY AS NEEDED FOR ANXIETY 10/20/22  Yes Stevens Magwood, Eilleen Kempf, MD  zolpidem (AMBIEN) 10 MG tablet TAKE 1 TABLET (10 MG TOTAL) BY MOUTH AT BEDTIME AS NEEDED FOR SLEEP. SHOULD NOT BE TAKEN EVERY DAY. 10/20/22  Yes Decklyn Hornik, Eilleen Kempf, MD  allopurinol (ZYLOPRIM) 100 MG tablet TAKE 1 TABLET BY MOUTH TWICE A DAY Patient not taking: Reported on 10/31/2022 09/30/20   Persons, [place], PA  colchicine 0.6 MG tablet As needed for gout flair Patient not taking: Reported on 10/31/2022 07/02/20   Persons, [place], PA  diclofenac sodium (VOLTAREN) 1 % GEL Apply 4 g topically 4 (four) times daily. Patient not taking: Reported on 10/31/2022  04/06/17   Kerrin Champagne, MD  ondansetron (ZOFRAN) 4 MG tablet Take 1 tablet (4 mg total) by mouth every 6 (six) hours. Patient not taking: Reported on 10/31/2022 08/03/21   Virgina Norfolk, DO  tamsulosin (FLOMAX) 0.4 MG CAPS capsule TAKE 2 CAPSULES (0.8 MG TOTAL) BY MOUTH DAILY. TAKE 1 CAPSULE BY MOUTH DAILY AFTER BREAKFAST. Patient not taking: Reported on 10/31/2022 03/29/22 03/24/23  Georgina Quint, MD    Allergies  Allergen Reactions   Ciprofloxacin Itching and Rash    Patient Active Problem List   Diagnosis Date Noted   Current moderate episode of major depressive disorder without prior episode (HCC) 09/07/2021   Irritable bowel syndrome with diarrhea 09/07/2016   Iron deficiency anemia due to chronic blood loss 09/07/2016   Hypothyroidism due to acquired atrophy of thyroid 08/15/2016   Degenerative disc disease, lumbar 03/16/2014   Insomnia 03/16/2014   Chronic pain syndrome 03/16/2014   Hypothyroidism 05/02/2012   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   ARTERIOVENOUS MALFORMATION 07/16/2009   Essential hypertension 07/19/2007    Past Medical History:  Diagnosis Date   Anxiety    Arthritis    Degenerative disc disease, lumbar    Hypertension    Hypothyroidism    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   CRWON SCALP- [place] MOHS    Past Surgical History:  Procedure Laterality Date   CHOLECYSTECTOMY     COLON SURGERY  08/07/2006  colostomy & small bowel resection   FRACTURE SURGERY     left shoulder   SPINE SURGERY     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    Social History   Socioeconomic History   Marital status: Married    Spouse name: Not on file   Number of children: Not on file   Years of education: Not on file   Highest education level: Not on file  Occupational History   Not on file  Tobacco Use   Smoking status: Never   Smokeless tobacco: Never  Substance and Sexual Activity   Alcohol use: Not on file   Drug use: Not on  file   Sexual activity: Yes  Other Topics Concern   Not on file  Social History Narrative   Not on file   Social Determinants of Health   Financial Resource Strain: Low Risk  (07/13/2022)   Overall Financial Resource Strain (CARDIA)    Difficulty of Paying Living Expenses: Not hard at all  Food Insecurity: No Food Insecurity (07/13/2022)   Hunger Vital Sign    Worried About Running Out of Food in the Last Year: Never true    Ran Out of Food in the Last Year: Never true  Transportation Needs: No Transportation Needs (07/13/2022)   PRAPARE - Administrator, Civil Service (Medical): No    Lack of Transportation (Non-Medical): No  Physical Activity: Inactive (07/13/2022)   Exercise Vital Sign    Days of Exercise per Week: 0 days    Minutes of Exercise per Session: 0 min  Stress: No Stress Concern Present (07/13/2022)   [employer] of Occupational Health - Occupational Stress Questionnaire    Feeling of Stress : Not at all  Social Connections: Socially Integrated (07/13/2022)   Social Connection and Isolation Panel [NHANES]    Frequency of Communication with Friends and Family: More than three times a week    Frequency of Social Gatherings with Friends and Family: More than three times a week    Attends Religious Services: More than 4 times per year    Active Member of [employer] or Organizations: Yes    Attends Engineer, structural: More than 4 times per year    Marital Status: Married  Catering manager Violence: Not At Risk (07/13/2022)   Humiliation, Afraid, Rape, and Kick questionnaire    Fear of Current or Ex-Partner: No    Emotionally Abused: No    Physically Abused: No    Sexually Abused: No    Family History  Problem Relation Age of Onset   Heart disease Mother        CAD and CHF     Review of Systems  Constitutional: Negative.  Negative for chills and fever.  HENT: Negative.  Negative for congestion and sore throat.   Respiratory: Negative.   Negative for cough and shortness of breath.   Cardiovascular: Negative.  Negative for chest pain and palpitations.  Gastrointestinal:  Negative for nausea and vomiting.  Genitourinary: Negative.  Negative for dysuria and hematuria.  Musculoskeletal:  Positive for back pain and joint pain.  Skin: Negative.  Negative for rash.  Neurological: Negative.  Negative for dizziness and headaches.  Psychiatric/Behavioral:  The patient is nervous/anxious and has insomnia.   All other systems reviewed and are negative.   Vitals:   10/31/22 0918  BP: 120/84  Pulse: 78  Temp: 98.1 F (36.7 C)  SpO2: 96%    Physical Exam Vitals reviewed.  Constitutional:  Appearance: Normal appearance.  HENT:     Head: Normocephalic.     Right Ear: Tympanic membrane, ear canal and external ear normal.     Left Ear: Tympanic membrane, ear canal and external ear normal.     Mouth/Throat:     Mouth: Mucous membranes are moist.     Pharynx: Oropharynx is clear.  Eyes:     Extraocular Movements: Extraocular movements intact.     Conjunctiva/sclera: Conjunctivae normal.     Pupils: Pupils are equal, round, and reactive to light.  Cardiovascular:     Rate and Rhythm: Normal rate and regular rhythm.     Pulses: Normal pulses.     Heart sounds: Normal heart sounds.  Pulmonary:     Effort: Pulmonary effort is normal.     Breath sounds: Normal breath sounds.  Abdominal:     Palpations: Abdomen is soft.     Tenderness: There is no abdominal tenderness.     Comments: Multiple old surgical wounds  Musculoskeletal:     Cervical back: No tenderness.     Right lower leg: No edema.     Left lower leg: No edema.     Comments: All surgical wounds on his back  Lymphadenopathy:     Cervical: No cervical adenopathy.  Skin:    General: Skin is warm and dry.     Capillary Refill: Capillary refill takes less than 2 seconds.  Neurological:     General: No focal deficit present.     Mental Status: He is alert and  oriented to person, place, and time.  Psychiatric:        Mood and Affect: Mood normal.        Behavior: Behavior normal.      ASSESSMENT & PLAN: Problem List Items Addressed This Visit       Cardiovascular and Mediastinum   Essential hypertension    BP Readings from Last 3 Encounters:  10/31/22 120/84  07/13/22 124/80  09/07/21 120/62  Well-controlled hypertension Continue Lotensin HCT 20-25 mg daily Cardiovascular risks associated with hypertension discussed Diet and nutrition discussed         Endocrine   Hypothyroidism due to acquired atrophy of thyroid    Clinically euthyroid TSH done today Continue Synthroid 125 mcg daily      Hypothyroidism   Relevant Orders   TSH     Other   Insomnia   Relevant Medications   zolpidem (AMBIEN) 10 MG tablet   Chronic pain syndrome    Active and affecting quality of life Failed trial of opioids.  Does not want to get on opioids. Needs to go back to pain management clinic New referral placed today.      Relevant Orders   Ambulatory referral to Pain Clinic   Iron deficiency anemia due to chronic blood loss    CBC done today along with iron and ferritin levels Clinically stable      Relevant Orders   Iron   Ferritin   Other Visit Diagnoses     Encounter for general adult medical examination with abnormal findings    -  Primary   Relevant Orders   CBC with Differential/Platelet   Comprehensive metabolic panel   Lipid panel   PSA, Medicare   Hemoglobin A1c   Anxiety state       Relevant Medications   LORazepam (ATIVAN) 1 MG tablet   Screening for deficiency anemia       Relevant Orders   CBC with Differential/Platelet  Screening for lipoid disorders       Relevant Orders   Lipid panel   Screening for endocrine, metabolic and immunity disorder       Relevant Orders   Comprehensive metabolic panel   Hemoglobin A1c   Screening for prostate cancer       Relevant Orders   PSA, Medicare      Modifiable  risk factors discussed with patient. Anticipatory guidance according to age provided. The following topics were also discussed: Social Determinants of Health Smoking.  Non-smoker Diet and nutrition Benefits of exercise Cancer screening and review of colonoscopy report from 2016 Vaccinations reviewed and recommendations Cardiovascular risk assessment and need for blood work Review of multiple chronic medical conditions and their management Review of all medications Pain management and need for follow-up with pain clinic Mental health including depression and anxiety Fall and accident prevention   Patient Instructions  Health Maintenance, Male Adopting a healthy lifestyle and getting preventive care are important in promoting health and wellness. Ask your health care provider about: The right schedule for you to have regular tests and exams. Things you can do on your own to prevent diseases and keep yourself healthy. What should I know about diet, weight, and exercise? Eat a healthy diet  Eat a diet that includes plenty of vegetables, fruits, low-fat dairy products, and lean protein. Do not eat a lot of foods that are high in solid fats, added sugars, or sodium. Maintain a healthy weight Body mass index (BMI) is a measurement that can be used to identify possible weight problems. It estimates body fat based on height and weight. Your health care provider can help determine your BMI and help you achieve or maintain a healthy weight. Get regular exercise Get regular exercise. This is one of the most important things you can do for your health. Most adults should: Exercise for at least 150 minutes each week. The exercise should increase your heart rate and make you sweat (moderate-intensity exercise). Do strengthening exercises at least twice a week. This is in addition to the moderate-intensity exercise. Spend less time sitting. Even light physical activity can be beneficial. Watch  cholesterol and blood lipids Have your blood tested for lipids and cholesterol at 75 years of age, then have this test every 5 years. You may need to have your cholesterol levels checked more often if: Your lipid or cholesterol levels are high. You are older than 75 years of age. You are at high risk for heart disease. What should I know about cancer screening? Many types of cancers can be detected early and may often be prevented. Depending on your health history and family history, you may need to have cancer screening at various ages. This may include screening for: Colorectal cancer. Prostate cancer. Skin cancer. Lung cancer. What should I know about heart disease, diabetes, and high blood pressure? Blood pressure and heart disease High blood pressure causes heart disease and increases the risk of stroke. This is more likely to develop in people who have high blood pressure readings or are overweight. Talk with your health care provider about your target blood pressure readings. Have your blood pressure checked: Every 3-5 years if you are 37-10 years of age. Every year if you are 78 years old or older. If you are between the ages of 43 and 64 and are a current or former smoker, ask your health care provider if you should have a one-time screening for abdominal aortic aneurysm (AAA). Diabetes Have regular  diabetes screenings. This checks your fasting blood sugar level. Have the screening done: Once every three years after age 94 if you are at a normal weight and have a low risk for diabetes. More often and at a younger age if you are overweight or have a high risk for diabetes. What should I know about preventing infection? Hepatitis B If you have a higher risk for hepatitis B, you should be screened for this virus. Talk with your health care provider to find out if you are at risk for hepatitis B infection. Hepatitis C Blood testing is recommended for: Everyone born from [birth year] through  1965. Anyone with known risk factors for hepatitis C. Sexually transmitted infections (STIs) You should be screened each year for STIs, including gonorrhea and chlamydia, if: You are sexually active and are younger than 75 years of age. You are older than 75 years of age and your health care provider tells you that you are at risk for this type of infection. Your sexual activity has changed since you were last screened, and you are at increased risk for chlamydia or gonorrhea. Ask your health care provider if you are at risk. Ask your health care provider about whether you are at high risk for HIV. Your health care provider may recommend a prescription medicine to help prevent HIV infection. If you choose to take medicine to prevent HIV, you should first get tested for HIV. You should then be tested every 3 months for as long as you are taking the medicine. Follow these instructions at home: Alcohol use Do not drink alcohol if your health care provider tells you not to drink. If you drink alcohol: Limit how much you have to 0-2 drinks a day. Know how much alcohol is in your drink. In the [place] one drink equals one 12 oz bottle of beer (355 mL), one 5 oz glass of wine (148 mL), or one 1 oz glass of hard liquor (44 mL). Lifestyle Do not use any products that contain nicotine or tobacco. These products include cigarettes, chewing tobacco, and vaping devices, such as e-cigarettes. If you need help quitting, ask your health care provider. Do not use street drugs. Do not share needles. Ask your health care provider for help if you need support or information about quitting drugs. General instructions Schedule regular health, dental, and eye exams. Stay current with your vaccines. Tell your health care provider if: You often feel depressed. You have ever been abused or do not feel safe at home. Summary Adopting a healthy lifestyle and getting preventive care are important in promoting health and  wellness. Follow your health care provider's instructions about healthy diet, exercising, and getting tested or screened for diseases. Follow your health care provider's instructions on monitoring your cholesterol and blood pressure. This information is not intended to replace advice given to you by your health care provider. Make sure you discuss any questions you have with your health care provider. Document Revised: 08/23/2020 Document Reviewed: 08/23/2020 Elsevier Patient Education  2024 Elsevier Inc.      Edwina Barth, MD [facility]

## 2022-10-31 NOTE — Assessment & Plan Note (Signed)
Clinically euthyroid.  TSH done today. Continue Synthroid 125 mcg daily. 

## 2022-10-31 NOTE — Assessment & Plan Note (Addendum)
Active and affecting quality of life Failed trial of opioids.  Does not want to get on opioids. Needs to go back to pain management clinic New referral placed today.

## 2022-10-31 NOTE — Assessment & Plan Note (Signed)
CBC done today along with iron and ferritin levels Clinically stable

## 2022-11-08 ENCOUNTER — Encounter: Payer: Self-pay | Admitting: Physical Medicine & Rehabilitation

## 2022-11-09 DIAGNOSIS — M961 Postlaminectomy syndrome, not elsewhere classified: Secondary | ICD-10-CM | POA: Diagnosis not present

## 2022-11-09 DIAGNOSIS — M533 Sacrococcygeal disorders, not elsewhere classified: Secondary | ICD-10-CM | POA: Diagnosis not present

## 2022-11-09 DIAGNOSIS — G8929 Other chronic pain: Secondary | ICD-10-CM | POA: Diagnosis not present

## 2022-12-12 ENCOUNTER — Encounter: Payer: Self-pay | Admitting: Physical Medicine & Rehabilitation

## 2022-12-12 ENCOUNTER — Encounter: Payer: Medicare HMO | Attending: Physical Medicine & Rehabilitation | Admitting: Physical Medicine & Rehabilitation

## 2022-12-12 VITALS — BP 104/68 | HR 82 | Ht 66.0 in | Wt 181.0 lb

## 2022-12-12 DIAGNOSIS — M545 Low back pain, unspecified: Secondary | ICD-10-CM | POA: Insufficient documentation

## 2022-12-12 DIAGNOSIS — M546 Pain in thoracic spine: Secondary | ICD-10-CM | POA: Diagnosis not present

## 2022-12-12 DIAGNOSIS — Z029 Encounter for administrative examinations, unspecified: Secondary | ICD-10-CM

## 2022-12-12 DIAGNOSIS — Z5181 Encounter for therapeutic drug level monitoring: Secondary | ICD-10-CM

## 2022-12-12 DIAGNOSIS — G894 Chronic pain syndrome: Secondary | ICD-10-CM | POA: Diagnosis not present

## 2022-12-12 DIAGNOSIS — G8929 Other chronic pain: Secondary | ICD-10-CM | POA: Diagnosis not present

## 2022-12-12 DIAGNOSIS — M2550 Pain in unspecified joint: Secondary | ICD-10-CM | POA: Diagnosis not present

## 2022-12-12 DIAGNOSIS — M542 Cervicalgia: Secondary | ICD-10-CM | POA: Insufficient documentation
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
## 2022-12-12 NOTE — Patient Instructions (Signed)
Try Tumaric Ordered TENS unit Will check UDS today

## 2022-12-19 LAB — TOXASSURE SELECT,+ANTIDEPR,UR

## 2023-01-15 ENCOUNTER — Encounter: Payer: Medicare HMO | Attending: Physical Medicine & Rehabilitation | Admitting: Physical Medicine & Rehabilitation

## 2023-01-15 ENCOUNTER — Encounter: Payer: Self-pay | Admitting: Physical Medicine & Rehabilitation

## 2023-01-15 VITALS — BP 117/75 | HR 69 | Ht 66.0 in | Wt 183.0 lb

## 2023-01-15 DIAGNOSIS — M542 Cervicalgia: Secondary | ICD-10-CM | POA: Insufficient documentation

## 2023-01-15 DIAGNOSIS — Z5181 Encounter for therapeutic drug level monitoring: Secondary | ICD-10-CM | POA: Insufficient documentation

## 2023-01-15 DIAGNOSIS — M545 Low back pain, unspecified: Secondary | ICD-10-CM | POA: Insufficient documentation

## 2023-01-15 DIAGNOSIS — Z029 Encounter for administrative examinations, unspecified: Secondary | ICD-10-CM | POA: Insufficient documentation

## 2023-01-15 DIAGNOSIS — G894 Chronic pain syndrome: Secondary | ICD-10-CM | POA: Insufficient documentation

## 2023-01-15 DIAGNOSIS — G8929 Other chronic pain: Secondary | ICD-10-CM | POA: Insufficient documentation

## 2023-01-15 DIAGNOSIS — M2550 Pain in unspecified joint: Secondary | ICD-10-CM | POA: Insufficient documentation

## 2023-01-15 MED ORDER — MORPHINE SULFATE 15 MG PO TABS: 7.5000 mg | ORAL_TABLET | Freq: Two times a day (BID) | ORAL | 0 refills | Status: DC
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
## 2023-01-20 ENCOUNTER — Other Ambulatory Visit: Payer: Self-pay | Admitting: Emergency Medicine

## 2023-01-20 DIAGNOSIS — F5104 Psychophysiologic insomnia: Secondary | ICD-10-CM

## 2023-02-09 ENCOUNTER — Other Ambulatory Visit: Payer: Self-pay | Admitting: Emergency Medicine

## 2023-02-09 DIAGNOSIS — F411 Generalized anxiety disorder: Secondary | ICD-10-CM

## 2023-02-13 ENCOUNTER — Encounter: Payer: Self-pay | Admitting: Physical Medicine & Rehabilitation

## 2023-02-13 ENCOUNTER — Encounter: Payer: Medicare HMO | Attending: Physical Medicine & Rehabilitation | Admitting: Physical Medicine & Rehabilitation

## 2023-02-13 VITALS — BP 113/73 | HR 72 | Ht 66.0 in | Wt 183.0 lb

## 2023-02-13 DIAGNOSIS — G894 Chronic pain syndrome: Secondary | ICD-10-CM | POA: Diagnosis not present

## 2023-02-13 DIAGNOSIS — Z029 Encounter for administrative examinations, unspecified: Secondary | ICD-10-CM | POA: Diagnosis not present

## 2023-02-13 DIAGNOSIS — M546 Pain in thoracic spine: Secondary | ICD-10-CM | POA: Insufficient documentation

## 2023-02-13 DIAGNOSIS — Z5181 Encounter for therapeutic drug level monitoring: Secondary | ICD-10-CM | POA: Insufficient documentation

## 2023-02-13 DIAGNOSIS — F32A Depression, unspecified: Secondary | ICD-10-CM | POA: Insufficient documentation

## 2023-02-13 DIAGNOSIS — M542 Cervicalgia: Secondary | ICD-10-CM | POA: Diagnosis not present

## 2023-02-13 DIAGNOSIS — G8929 Other chronic pain: Secondary | ICD-10-CM | POA: Insufficient documentation

## 2023-02-13 DIAGNOSIS — M2550 Pain in unspecified joint: Secondary | ICD-10-CM | POA: Diagnosis not present

## 2023-02-13 DIAGNOSIS — M545 Low back pain, unspecified: Secondary | ICD-10-CM | POA: Insufficient documentation

## 2023-02-13 MED ORDER — TIZANIDINE HCL 2 MG PO TABS: 2.0000 mg | ORAL_TABLET | Freq: Three times a day (TID) | ORAL | 1 refills | Status: DC | PRN

## 2023-02-13 NOTE — Telephone Encounter (Signed)
Patient came into office and states that he doesn't understand why he only got 15 tablets of his Ambien. Patient states he is taking it every night. Patient was informed that he is not suppose to take it every day. Patient states he needs to take it every night due to him not being able to sleep. Patient has 1 refill left at the pharmacy but he is requesting a full 30 day rx to be sent to his pharmacy. Please advise.
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
## 2023-02-14 ENCOUNTER — Encounter: Payer: Self-pay | Admitting: Emergency Medicine

## 2023-02-14 ENCOUNTER — Ambulatory Visit (INDEPENDENT_AMBULATORY_CARE_PROVIDER_SITE_OTHER): Payer: Medicare HMO | Admitting: Emergency Medicine

## 2023-02-14 VITALS — BP 110/64 | HR 82 | Temp 97.8°F | Ht 66.0 in | Wt 185.4 lb

## 2023-02-14 DIAGNOSIS — I10 Essential (primary) hypertension: Secondary | ICD-10-CM | POA: Diagnosis not present

## 2023-02-14 DIAGNOSIS — G894 Chronic pain syndrome: Secondary | ICD-10-CM | POA: Diagnosis not present

## 2023-02-14 DIAGNOSIS — F5104 Psychophysiologic insomnia: Secondary | ICD-10-CM | POA: Diagnosis not present

## 2023-02-14 DIAGNOSIS — E034 Atrophy of thyroid (acquired): Secondary | ICD-10-CM

## 2023-02-14 MED ORDER — ZOLPIDEM TARTRATE 10 MG PO TABS
10.0000 mg | ORAL_TABLET | Freq: Every evening | ORAL | 5 refills | Status: DC | PRN
Start: 2023-02-14 — End: 2023-08-14

## 2023-02-14 NOTE — Assessment & Plan Note (Signed)
Clinically euthyroid Continue levothyroxine 125 mcg daily Lab Results  Component Value Date   TSH 0.61 10/31/2022

## 2023-02-14 NOTE — Progress Notes (Signed)
Henry [name] 77 y.o.   Chief Complaint  Patient presents with   Medical Management of Chronic Issues     F/u appt, patient has a question about the ambien, pt has been taking it every day 15 tabs. He states he wants a 30 tab     HISTORY OF PRESENT ILLNESS: This is a 77 y.o. male here for follow-up of chronic medical conditions Has history of chronic insomnia.  Has been taking Ambien for a while.  Requesting 30-day prescriptions at a time. Otherwise doing well.  Has no complaints or medical concerns today.  HPI   Prior to Admission medications   Medication Sig Start Date End Date Taking? Authorizing Provider  benazepril-hydrochlorthiazide (LOTENSIN HCT) 20-25 MG tablet TAKE 1 TABLET BY MOUTH EVERY DAY 04/27/22  Yes Dayvion Sans, Eilleen Kempf, MD  cholecalciferol (VITAMIN D) 1000 UNITS tablet Take 1,000 Units by mouth daily.   Yes [provider]  diclofenac sodium (VOLTAREN) 1 % GEL Apply 4 g topically 4 (four) times daily. 04/06/17  Yes Kerrin Champagne, MD  fish oil-omega-3 fatty acids 1000 MG capsule Take 2 g by mouth daily.   Yes [provider]  levothyroxine (SYNTHROID) 125 MCG tablet TAKE 1 TABLET BY MOUTH EVERY DAY BEFORE BREAKFAST 03/29/22  Yes Anisa Leanos, Eilleen Kempf, MD  LORazepam (ATIVAN) 1 MG tablet TAKE 1 TABLET (1 MG TOTAL) BY MOUTH DAILY AS NEEDED FOR ANXIETY 02/12/23  Yes Leonilda Cozby, Eilleen Kempf, MD  [facility] 4 MG/0.1ML LIQD nasal spray kit as directed Nasally in case of overdose: 1 acuation in one nostril x 1 , may repeat every 2-3 mins until pt is responsive for 30 days 10/16/22  Yes [provider]  tamsulosin (FLOMAX) 0.4 MG CAPS capsule TAKE 2 CAPSULES (0.8 MG TOTAL) BY MOUTH DAILY. TAKE 1 CAPSULE BY MOUTH DAILY AFTER BREAKFAST. 03/29/22 03/24/23 Yes Cassidee Deats, Eilleen Kempf, MD  tiZANidine (ZANAFLEX) 2 MG tablet Take 1 tablet (2 mg total) by mouth every 8 (eight) hours as needed for muscle spasms. 02/13/23  Yes Fanny Dance, MD  zolpidem (AMBIEN) 10  MG tablet TAKE 1 TABLET (10 MG TOTAL) BY MOUTH AT BEDTIME AS NEEDED FOR SLEEP. SHOULD NOT BE TAKEN EVERY DAY. 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   Essential hypertension 07/19/2007    Past Medical History:  Diagnosis Date   Anxiety    Arthritis    Degenerative disc disease, lumbar    Hypertension    Hypothyroidism    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   CRWON SCALP- [place] MOHS    Past Surgical History:  Procedure Laterality Date   CHOLECYSTECTOMY     COLON SURGERY  08/07/2006   colostomy & small bowel resection   FRACTURE SURGERY     left shoulder   SPINE SURGERY     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    Social History  Socioeconomic History   Marital status: Married    Spouse name: Not on file   Number of children: Not on file   Years of education: Not on file   Highest education level: Not on file  Occupational History   Not on file  Tobacco Use   Smoking status: Never   Smokeless tobacco: Never  Vaping Use   Vaping status: Not on file  Substance and Sexual Activity   Alcohol use: Not on file   Drug use: Not on file   Sexual activity: Yes  Other Topics Concern   Not on file  Social History Narrative   Not on file   Social Determinants of Health   Financial Resource Strain: Low Risk  (07/13/2022)    Overall Financial Resource Strain (CARDIA)    Difficulty of Paying Living Expenses: Not hard at all  Food Insecurity: No Food Insecurity (07/13/2022)   Hunger Vital Sign    Worried About Running Out of Food in the Last Year: Never true    Ran Out of Food in the Last Year: Never true  Transportation Needs: No Transportation Needs (07/13/2022)   PRAPARE - Administrator, Civil Service (Medical): No    Lack of Transportation (Non-Medical): No  Physical Activity: Inactive (07/13/2022)   Exercise Vital Sign    Days of Exercise per Week: 0 days    Minutes of Exercise per Session: 0 min  Stress: No Stress Concern Present (07/13/2022)   [employer] of Occupational Health - Occupational Stress Questionnaire    Feeling of Stress : Not at all  Social Connections: Socially Integrated (07/13/2022)   Social Connection and Isolation Panel [NHANES]    Frequency of Communication with Friends and Family: More than three times a week    Frequency of Social Gatherings with Friends and Family: More than three times a week    Attends Religious Services: More than 4 times per year    Active Member of [employer] or Organizations: Yes    Attends Engineer, structural: More than 4 times per year    Marital Status: Married  Catering manager Violence: Not At Risk (07/13/2022)   Humiliation, Afraid, Rape, and Kick questionnaire    Fear of Current or Ex-Partner: No    Emotionally Abused: No    Physically Abused: No    Sexually Abused: No    Family History  Problem Relation Age of Onset   Heart disease Mother        CAD and CHF     Review of Systems  Constitutional: Negative.  Negative for chills and fever.  HENT: Negative.  Negative for congestion and sore throat.   Respiratory: Negative.  Negative for cough and shortness of breath.   Cardiovascular: Negative.  Negative for chest pain and palpitations.  Gastrointestinal:  Negative for abdominal pain, diarrhea, nausea and vomiting.   Musculoskeletal:  Positive for back pain, joint pain and neck pain.  Skin: Negative.  Negative for rash.  Neurological: Negative.  Negative for dizziness and headaches.  Psychiatric/Behavioral:  The patient has insomnia.   All other systems reviewed and are negative.   Vitals:   02/14/23 1422  BP: 110/64  Pulse: 82  Temp: 97.8 F (36.6 C)  SpO2: 96%    Physical Exam Vitals reviewed.  Constitutional:      Appearance: Normal appearance.  HENT:     Head: Normocephalic.     Mouth/Throat:     Mouth: Mucous membranes are moist.  Pharynx: Oropharynx is clear.  Eyes:     Extraocular Movements: Extraocular movements intact.     Conjunctiva/sclera: Conjunctivae normal.     Pupils: Pupils are equal, round, and reactive to light.  Cardiovascular:     Rate and Rhythm: Normal rate and regular rhythm.     Pulses: Normal pulses.     Heart sounds: Normal heart sounds.  Pulmonary:     Effort: Pulmonary effort is normal.     Breath sounds: Normal breath sounds.  Abdominal:     Palpations: Abdomen is soft.     Tenderness: There is no abdominal tenderness.     Comments: Multiple old surgical scars  Musculoskeletal:     Cervical back: No tenderness.  Lymphadenopathy:     Cervical: No cervical adenopathy.  Skin:    General: Skin is warm and dry.  Neurological:     Mental Status: He is alert and oriented to person, place, and time.  Psychiatric:        Mood and Affect: Mood normal.        Behavior: Behavior normal.      ASSESSMENT & PLAN: A total of 46 minutes was spent with the patient and counseling/coordination of care regarding preparing for this visit, review of most recent office visit notes, review of multiple chronic medical conditions under management, review of all medications, review of most recent blood work results, cardiovascular risks associated with hypertension, education on nutrition, treatment of chronic insomnia, prognosis, documentation and need for  follow-up.  Problem List Items Addressed This Visit       Cardiovascular and Mediastinum   Essential hypertension - Primary    BP Readings from Last 3 Encounters:  02/14/23 110/64  02/13/23 113/73  01/15/23 117/75  Well-controlled hypertension Continue Lotensin HCTZ 20-25 mg daily Cardiovascular risks associated with hypertension discussed         Endocrine   Hypothyroidism due to acquired atrophy of thyroid    Clinically euthyroid Continue levothyroxine 125 mcg daily Lab Results  Component Value Date   TSH 0.61 10/31/2022           Other   Insomnia    Well-controlled on Ambien. Has been taking Ambien for many years Recommend to continue New prescription for Ambien 10 mg sent to pharmacy of record today.      Relevant Medications   zolpidem (AMBIEN) 10 MG tablet   Chronic pain syndrome    Was able to establish care with local pain management doctor recently. No concerns today.      Patient Instructions  Health Maintenance After Age 62 After age 78, you are at a higher risk for certain long-term diseases and infections as well as injuries from falls. Falls are a major cause of broken bones and head injuries in people who are older than age 17. Getting regular preventive care can help to keep you healthy and well. Preventive care includes getting regular testing and making lifestyle changes as recommended by your health care provider. Talk with your health care provider about: Which screenings and tests you should have. A screening is a test that checks for a disease when you have no symptoms. A diet and exercise plan that is right for you. What should I know about screenings and tests to prevent falls? Screening and testing are the best ways to find a health problem early. Early diagnosis and treatment give you the best chance of managing medical conditions that are common after age 40. Certain conditions and lifestyle choices  may make you more likely to have a  fall. Your health care provider may recommend: Regular vision checks. Poor vision and conditions such as cataracts can make you more likely to have a fall. If you wear glasses, make sure to get your prescription updated if your vision changes. Medicine review. Work with your health care provider to regularly review all of the medicines you are taking, including over-the-counter medicines. Ask your health care provider about any side effects that may make you more likely to have a fall. Tell your health care provider if any medicines that you take make you feel dizzy or sleepy. Strength and balance checks. Your health care provider may recommend certain tests to check your strength and balance while standing, walking, or changing positions. Foot health exam. Foot pain and numbness, as well as not wearing proper footwear, can make you more likely to have a fall. Screenings, including: Osteoporosis screening. Osteoporosis is a condition that causes the bones to get weaker and break more easily. Blood pressure screening. Blood pressure changes and medicines to control blood pressure can make you feel dizzy. Depression screening. You may be more likely to have a fall if you have a fear of falling, feel depressed, or feel unable to do activities that you used to do. Alcohol use screening. Using too much alcohol can affect your balance and may make you more likely to have a fall. Follow these instructions at home: Lifestyle Do not drink alcohol if: Your health care provider tells you not to drink. If you drink alcohol: Limit how much you have to: 0-1 drink a day for women. 0-2 drinks a day for men. Know how much alcohol is in your drink. In the [place] one drink equals one 12 oz bottle of beer (355 mL), one 5 oz glass of wine (148 mL), or one 1 oz glass of hard liquor (44 mL). Do not use any products that contain nicotine or tobacco. These products include cigarettes, chewing tobacco, and vaping devices,  such as e-cigarettes. If you need help quitting, ask your health care provider. Activity  Follow a regular exercise program to stay fit. This will help you maintain your balance. Ask your health care provider what types of exercise are appropriate for you. If you need a cane or walker, use it as recommended by your health care provider. Wear supportive shoes that have nonskid soles. Safety  Remove any tripping hazards, such as rugs, cords, and clutter. Install safety equipment such as grab bars in bathrooms and safety rails on stairs. Keep rooms and walkways well-lit. General instructions Talk with your health care provider about your risks for falling. Tell your health care provider if: You fall. Be sure to tell your health care provider about all falls, even ones that seem minor. You feel dizzy, tiredness (fatigue), or off-balance. Take over-the-counter and prescription medicines only as told by your health care provider. These include supplements. Eat a healthy diet and maintain a healthy weight. A healthy diet includes low-fat dairy products, low-fat (lean) meats, and fiber from whole grains, beans, and lots of fruits and vegetables. Stay current with your vaccines. Schedule regular health, dental, and eye exams. Summary Having a healthy lifestyle and getting preventive care can help to protect your health and wellness after age 41. Screening and testing are the best way to find a health problem early and help you avoid having a fall. Early diagnosis and treatment give you the best chance for managing medical conditions that are more  common for people who are older than age 76. Falls are a major cause of broken bones and head injuries in people who are older than age 51. Take precautions to prevent a fall at home. Work with your health care provider to learn what changes you can make to improve your health and wellness and to prevent falls. This information is not intended to replace  advice given to you by your health care provider. Make sure you discuss any questions you have with your health care provider. Document Revised: 08/23/2020 Document Reviewed: 08/23/2020 Elsevier Patient Education  2024 Elsevier Inc.    Edwina Barth, MD  Primary Care at [facility]

## 2023-02-14 NOTE — Assessment & Plan Note (Signed)
BP Readings from Last 3 Encounters:  02/14/23 110/64  02/13/23 113/73  01/15/23 117/75  Well-controlled hypertension Continue Lotensin HCTZ 20-25 mg daily Cardiovascular risks associated with hypertension discussed

## 2023-02-14 NOTE — Assessment & Plan Note (Signed)
Was able to establish care with local pain management doctor recently. No concerns today.

## 2023-02-14 NOTE — Patient Instructions (Signed)
Health Maintenance After Age 75 After age 75, you are at a higher risk for certain long-term diseases and infections as well as injuries from falls. Falls are a major cause of broken bones and head injuries in people who are older than age 75. Getting regular preventive care can help to keep you healthy and well. Preventive care includes getting regular testing and making lifestyle changes as recommended by your health care provider. Talk with your health care provider about: Which screenings and tests you should have. A screening is a test that checks for a disease when you have no symptoms. A diet and exercise plan that is right for you. What should I know about screenings and tests to prevent falls? Screening and testing are the best ways to find a health problem early. Early diagnosis and treatment give you the best chance of managing medical conditions that are common after age 75. Certain conditions and lifestyle choices may make you more likely to have a fall. Your health care provider may recommend: Regular vision checks. Poor vision and conditions such as cataracts can make you more likely to have a fall. If you wear glasses, make sure to get your prescription updated if your vision changes. Medicine review. Work with your health care provider to regularly review all of the medicines you are taking, including over-the-counter medicines. Ask your health care provider about any side effects that may make you more likely to have a fall. Tell your health care provider if any medicines that you take make you feel dizzy or sleepy. Strength and balance checks. Your health care provider may recommend certain tests to check your strength and balance while standing, walking, or changing positions. Foot health exam. Foot pain and numbness, as well as not wearing proper footwear, can make you more likely to have a fall. Screenings, including: Osteoporosis screening. Osteoporosis is a condition that causes  the bones to get weaker and break more easily. Blood pressure screening. Blood pressure changes and medicines to control blood pressure can make you feel dizzy. Depression screening. You may be more likely to have a fall if you have a fear of falling, feel depressed, or feel unable to do activities that you used to do. Alcohol use screening. Using too much alcohol can affect your balance and may make you more likely to have a fall. Follow these instructions at home: Lifestyle Do not drink alcohol if: Your health care provider tells you not to drink. If you drink alcohol: Limit how much you have to: 0-1 drink a day for women. 0-2 drinks a day for men. Know how much alcohol is in your drink. In the [place] one drink equals one 12 oz bottle of beer (355 mL), one 5 oz glass of wine (148 mL), or one 1 oz glass of hard liquor (44 mL). Do not use any products that contain nicotine or tobacco. These products include cigarettes, chewing tobacco, and vaping devices, such as e-cigarettes. If you need help quitting, ask your health care provider. Activity  Follow a regular exercise program to stay fit. This will help you maintain your balance. Ask your health care provider what types of exercise are appropriate for you. If you need a cane or walker, use it as recommended by your health care provider. Wear supportive shoes that have nonskid soles. Safety  Remove any tripping hazards, such as rugs, cords, and clutter. Install safety equipment such as grab bars in bathrooms and safety rails on stairs. Keep rooms and walkways   well-lit. General instructions Talk with your health care provider about your risks for falling. Tell your health care provider if: You fall. Be sure to tell your health care provider about all falls, even ones that seem minor. You feel dizzy, tiredness (fatigue), or off-balance. Take over-the-counter and prescription medicines only as told by your health care provider. These include  supplements. Eat a healthy diet and maintain a healthy weight. A healthy diet includes low-fat dairy products, low-fat (lean) meats, and fiber from whole grains, beans, and lots of fruits and vegetables. Stay current with your vaccines. Schedule regular health, dental, and eye exams. Summary Having a healthy lifestyle and getting preventive care can help to protect your health and wellness after age 75. Screening and testing are the best way to find a health problem early and help you avoid having a fall. Early diagnosis and treatment give you the best chance for managing medical conditions that are more common for people who are older than age 75. Falls are a major cause of broken bones and head injuries in people who are older than age 75. Take precautions to prevent a fall at home. Work with your health care provider to learn what changes you can make to improve your health and wellness and to prevent falls. This information is not intended to replace advice given to you by your health care provider. Make sure you discuss any questions you have with your health care provider. Document Revised: 08/23/2020 Document Reviewed: 08/23/2020 Elsevier Patient Education  2024 Elsevier Inc.  

## 2023-02-14 NOTE — Assessment & Plan Note (Signed)
Well-controlled on Ambien. Has been taking Ambien for many years Recommend to continue New prescription for Ambien 10 mg sent to pharmacy of record today.

## 2023-02-16 LAB — TOXASSURE SELECT,+ANTIDEPR,UR

## 2023-03-07 ENCOUNTER — Other Ambulatory Visit: Payer: Self-pay | Admitting: Physical Medicine & Rehabilitation

## 2023-03-16 ENCOUNTER — Other Ambulatory Visit: Payer: Self-pay | Admitting: Emergency Medicine

## 2023-03-16 ENCOUNTER — Other Ambulatory Visit: Payer: Self-pay | Admitting: Physical Medicine & Rehabilitation

## 2023-03-16 DIAGNOSIS — I10 Essential (primary) hypertension: Secondary | ICD-10-CM

## 2023-04-05 ENCOUNTER — Encounter: Payer: Medicare HMO | Admitting: Physical Medicine & Rehabilitation

## 2023-04-19 ENCOUNTER — Encounter: Payer: Medicare HMO | Attending: Physical Medicine & Rehabilitation | Admitting: Physical Medicine & Rehabilitation

## 2023-04-19 DIAGNOSIS — F32A Depression, unspecified: Secondary | ICD-10-CM | POA: Insufficient documentation

## 2023-04-19 DIAGNOSIS — M2550 Pain in unspecified joint: Secondary | ICD-10-CM | POA: Insufficient documentation

## 2023-04-19 DIAGNOSIS — M542 Cervicalgia: Secondary | ICD-10-CM | POA: Insufficient documentation

## 2023-04-19 DIAGNOSIS — M546 Pain in thoracic spine: Secondary | ICD-10-CM | POA: Insufficient documentation

## 2023-04-19 DIAGNOSIS — Z5181 Encounter for therapeutic drug level monitoring: Secondary | ICD-10-CM | POA: Insufficient documentation

## 2023-04-19 DIAGNOSIS — G894 Chronic pain syndrome: Secondary | ICD-10-CM | POA: Insufficient documentation

## 2023-04-19 DIAGNOSIS — Z029 Encounter for administrative examinations, unspecified: Secondary | ICD-10-CM | POA: Insufficient documentation

## 2023-04-19 DIAGNOSIS — G8929 Other chronic pain: Secondary | ICD-10-CM | POA: Insufficient documentation

## 2023-04-19 DIAGNOSIS — M545 Low back pain, unspecified: Secondary | ICD-10-CM | POA: Insufficient documentation

## 2023-05-15 ENCOUNTER — Other Ambulatory Visit: Payer: Self-pay | Admitting: Emergency Medicine

## 2023-05-15 DIAGNOSIS — F411 Generalized anxiety disorder: Secondary | ICD-10-CM

## 2023-05-16 DIAGNOSIS — L821 Other seborrheic keratosis: Secondary | ICD-10-CM | POA: Diagnosis not present

## 2023-05-16 DIAGNOSIS — Z85828 Personal history of other malignant neoplasm of skin: Secondary | ICD-10-CM | POA: Diagnosis not present

## 2023-05-16 DIAGNOSIS — D692 Other nonthrombocytopenic purpura: Secondary | ICD-10-CM | POA: Diagnosis not present

## 2023-05-16 DIAGNOSIS — D0462 Carcinoma in situ of skin of left upper limb, including shoulder: Secondary | ICD-10-CM | POA: Diagnosis not present

## 2023-05-16 DIAGNOSIS — D1801 Hemangioma of skin and subcutaneous tissue: Secondary | ICD-10-CM | POA: Diagnosis not present

## 2023-05-16 DIAGNOSIS — D485 Neoplasm of uncertain behavior of skin: Secondary | ICD-10-CM | POA: Diagnosis not present

## 2023-05-16 DIAGNOSIS — L570 Actinic keratosis: Secondary | ICD-10-CM | POA: Diagnosis not present

## 2023-05-16 DIAGNOSIS — L738 Other specified follicular disorders: Secondary | ICD-10-CM | POA: Diagnosis not present

## 2023-05-16 DIAGNOSIS — D225 Melanocytic nevi of trunk: Secondary | ICD-10-CM | POA: Diagnosis not present

## 2023-05-16 DIAGNOSIS — D3617 Benign neoplasm of peripheral nerves and autonomic nervous system of trunk, unspecified: Secondary | ICD-10-CM | POA: Diagnosis not present

## 2023-05-16 DIAGNOSIS — L814 Other melanin hyperpigmentation: Secondary | ICD-10-CM | POA: Diagnosis not present

## 2023-07-10 ENCOUNTER — Telehealth: Payer: Self-pay | Admitting: Emergency Medicine

## 2023-07-10 NOTE — Telephone Encounter (Signed)
 Copied from CRM [id number]. Topic: Clinical - Medication Refill >> Jul 10, 2023  9:42 AM Alcus Dad wrote: Most Recent Primary Care Visit:  Provider: Georgina Quint  Department: [facility]  Visit Type: OFFICE VISIT  Date: 02/14/2023  Medication: lidocaine patch- max strength 4%... 4 boxes per month (Chronic pain)  Has the patient contacted their pharmacy? No (Agent: If no, request that the patient contact the pharmacy for the refill. If patient does not wish to contact the pharmacy document the reason why and proceed with request.) (Agent: If yes, when and what did the pharmacy advise?)  Is this the correct pharmacy for this prescription? Yes If no, delete pharmacy and type the correct one.  This is the patient's preferred pharmacy:  [id number] [place], [place] - [address] AT [facility] [address] [place] [address] Phone: [phone] Fax: [phone]  Has the prescription been filled recently? No  Is the patient out of the medication? yes  Has the patient been seen for an appointment in the last year OR does the patient have an upcoming appointment? Yes  Can we respond through MyChart? Yes  Agent: Please be advised that Rx refills may take up to 3 business days. We ask that you follow-up with your pharmacy.

## 2023-07-10 NOTE — Telephone Encounter (Signed)
 Needs to be prescribed by chronic pain management doctor.  Thanks.

## 2023-07-12 ENCOUNTER — Other Ambulatory Visit: Payer: Self-pay | Admitting: Emergency Medicine

## 2023-07-12 DIAGNOSIS — F411 Generalized anxiety disorder: Secondary | ICD-10-CM

## 2023-07-16 ENCOUNTER — Encounter: Payer: Self-pay | Admitting: Radiology

## 2023-07-17 ENCOUNTER — Encounter: Payer: Self-pay | Admitting: Emergency Medicine

## 2023-07-17 ENCOUNTER — Telehealth: Payer: Self-pay | Admitting: Emergency Medicine

## 2023-07-17 ENCOUNTER — Ambulatory Visit (INDEPENDENT_AMBULATORY_CARE_PROVIDER_SITE_OTHER): Payer: Medicare HMO

## 2023-07-17 ENCOUNTER — Ambulatory Visit (INDEPENDENT_AMBULATORY_CARE_PROVIDER_SITE_OTHER): Admitting: Emergency Medicine

## 2023-07-17 VITALS — BP 128/62 | HR 68 | Ht 64.0 in | Wt 180.0 lb

## 2023-07-17 VITALS — BP 142/80 | HR 78 | Temp 97.7°F | Ht 64.0 in | Wt 179.0 lb

## 2023-07-17 DIAGNOSIS — I10 Essential (primary) hypertension: Secondary | ICD-10-CM | POA: Diagnosis not present

## 2023-07-17 DIAGNOSIS — E039 Hypothyroidism, unspecified: Secondary | ICD-10-CM | POA: Diagnosis not present

## 2023-07-17 DIAGNOSIS — F4323 Adjustment disorder with mixed anxiety and depressed mood: Secondary | ICD-10-CM | POA: Diagnosis not present

## 2023-07-17 DIAGNOSIS — F321 Major depressive disorder, single episode, moderate: Secondary | ICD-10-CM

## 2023-07-17 DIAGNOSIS — Z0000 Encounter for general adult medical examination without abnormal findings: Secondary | ICD-10-CM

## 2023-07-17 DIAGNOSIS — G894 Chronic pain syndrome: Secondary | ICD-10-CM

## 2023-07-17 MED ORDER — BUSPIRONE HCL 7.5 MG PO TABS: 7.5000 mg | ORAL_TABLET | Freq: Two times a day (BID) | ORAL | 3 refills | Status: DC

## 2023-07-17 MED ORDER — DULOXETINE HCL 30 MG PO CPEP: 30.0000 mg | ORAL_CAPSULE | Freq: Every day | ORAL | 3 refills | Status: DC

## 2023-07-17 NOTE — Patient Instructions (Signed)
 Health Maintenance After Age 76 After age 4, you are at a higher risk for certain long-term diseases and infections as well as injuries from falls. Falls are a major cause of broken bones and head injuries in people who are older than age 47. Getting regular preventive care can help to keep you healthy and well. Preventive care includes getting regular testing and making lifestyle changes as recommended by your health care provider. Talk with your health care provider about: Which screenings and tests you should have. A screening is a test that checks for a disease when you have no symptoms. A diet and exercise plan that is right for you. What should I know about screenings and tests to prevent falls? Screening and testing are the best ways to find a health problem early. Early diagnosis and treatment give you the best chance of managing medical conditions that are common after age 37. Certain conditions and lifestyle choices may make you more likely to have a fall. Your health care provider may recommend: Regular vision checks. Poor vision and conditions such as cataracts can make you more likely to have a fall. If you wear glasses, make sure to get your prescription updated if your vision changes. Medicine review. Work with your health care provider to regularly review all of the medicines you are taking, including over-the-counter medicines. Ask your health care provider about any side effects that may make you more likely to have a fall. Tell your health care provider if any medicines that you take make you feel dizzy or sleepy. Strength and balance checks. Your health care provider may recommend certain tests to check your strength and balance while standing, walking, or changing positions. Foot health exam. Foot pain and numbness, as well as not wearing proper footwear, can make you more likely to have a fall. Screenings, including: Osteoporosis screening. Osteoporosis is a condition that causes  the bones to get weaker and break more easily. Blood pressure screening. Blood pressure changes and medicines to control blood pressure can make you feel dizzy. Depression screening. You may be more likely to have a fall if you have a fear of falling, feel depressed, or feel unable to do activities that you used to do. Alcohol use screening. Using too much alcohol can affect your balance and may make you more likely to have a fall. Follow these instructions at home: Lifestyle Do not drink alcohol if: Your health care provider tells you not to drink. If you drink alcohol: Limit how much you have to: 0-1 drink a day for women. 0-2 drinks a day for men. Know how much alcohol is in your drink. In the [place] one drink equals one 12 oz bottle of beer (355 mL), one 5 oz glass of wine (148 mL), or one 1 oz glass of hard liquor (44 mL). Do not use any products that contain nicotine or tobacco. These products include cigarettes, chewing tobacco, and vaping devices, such as e-cigarettes. If you need help quitting, ask your health care provider. Activity  Follow a regular exercise program to stay fit. This will help you maintain your balance. Ask your health care provider what types of exercise are appropriate for you. If you need a cane or walker, use it as recommended by your health care provider. Wear supportive shoes that have nonskid soles. Safety  Remove any tripping hazards, such as rugs, cords, and clutter. Install safety equipment such as grab bars in bathrooms and safety rails on stairs. Keep rooms and walkways  well-lit. General instructions Talk with your health care provider about your risks for falling. Tell your health care provider if: You fall. Be sure to tell your health care provider about all falls, even ones that seem minor. You feel dizzy, tiredness (fatigue), or off-balance. Take over-the-counter and prescription medicines only as told by your health care provider. These include  supplements. Eat a healthy diet and maintain a healthy weight. A healthy diet includes low-fat dairy products, low-fat (lean) meats, and fiber from whole grains, beans, and lots of fruits and vegetables. Stay current with your vaccines. Schedule regular health, dental, and eye exams. Summary Having a healthy lifestyle and getting preventive care can help to protect your health and wellness after age 11. Screening and testing are the best way to find a health problem early and help you avoid having a fall. Early diagnosis and treatment give you the best chance for managing medical conditions that are more common for people who are older than age 28. Falls are a major cause of broken bones and head injuries in people who are older than age 48. Take precautions to prevent a fall at home. Work with your health care provider to learn what changes you can make to improve your health and wellness and to prevent falls. This information is not intended to replace advice given to you by your health care provider. Make sure you discuss any questions you have with your health care provider. Document Revised: 08/23/2020 Document Reviewed: 08/23/2020 Elsevier Patient Education  2024 [employer].

## 2023-07-17 NOTE — Telephone Encounter (Signed)
 Patient called, left VM to return the call to the office to let us know the pharmacy to send medication to.  Copied from CRM [id number]. Topic: Clinical - Medication Refill >> Jul 17, 2023  4:08 PM Saverio Danker wrote: Most Recent Primary Care Visit:  Provider: Georgina Quint  Department: [facility]  Visit Type: OFFICE VISIT  Date: 02/14/2023  Medication: lidocaine pain relief pain patches extreme strength 6 boxes per month   Has the patient contacted their pharmacy? Yes (Agent: If no, request that the patient contact the pharmacy for the refill. If patient does not wish to contact the pharmacy document the reason why and proceed with request.) (Agent: If yes, when and what did the pharmacy advise?)  Is this the correct pharmacy for this prescription? Yes If no, delete pharmacy and type the correct one.  This is the patient's preferred pharmacy:  [id number] [place], [place] - [address] AT [facility] [address] [place] [address] Phone: [phone] Fax: [phone]  [id number] [place], [place] - [address] AT [address] [place] [address] Phone: [phone] Fax: [phone]  [id number] [place], [place] - [address] AT [facility] [address] [place] [address] Phone: [phone] Fax: [phone]   Has the prescription been filled recently? No  Is the patient out of the medication? No  Has the patient been seen for an appointment in the last year OR does the patient have an upcoming appointment? Yes  Can we respond through MyChart? No [phone]  Agent: Please be advised that Rx refills may take up to 3 business days. We ask that you follow-up with your pharmacy.

## 2023-07-17 NOTE — Patient Instructions (Addendum)
 [name] , Thank you for taking time to come for your Medicare Wellness Visit. I appreciate your ongoing commitment to your health goals. Please review the following plan we discussed and let me know if I can assist you in the future.   Referrals/Orders/Follow-Ups/Clinician Recommendations: Aim for 30 minutes of exercise or brisk walking, 6-8 glasses of water, and 5 servings of fruits and vegetables each day.   This is a list of the screening recommended for you and due dates:  Health Maintenance  Topic Date Due   Zoster (Shingles) Vaccine (2 of 2) 06/06/2018   COVID-19 Vaccine (3 - 2024-25 season) 12/17/2022   Pneumonia Vaccine (1 of 1 - PCV) 10/31/2023*   Flu Shot  11/16/2023   Medicare Annual Wellness Visit  07/16/2024   Hepatitis C Screening  Completed   HPV Vaccine  Aged Out   DTaP/Tdap/Td vaccine  Discontinued   Colon Cancer Screening  Discontinued  *Topic was postponed. The date shown is not the original due date.    Advanced directives: (Declined) Advance directive discussed with you today. Even though you declined this today, please call our office should you change your mind, and we can give you the proper paperwork for you to fill out.  Next Medicare Annual Wellness Visit scheduled for next year: Yes

## 2023-07-17 NOTE — Assessment & Plan Note (Signed)
 Clinically euthyroid. Continue Synthroid 125 mcg daily.

## 2023-07-17 NOTE — Assessment & Plan Note (Signed)
Aggravated by chronic pain syndrome. May benefit from starting Cymbalta 30 mg daily.

## 2023-07-17 NOTE — Assessment & Plan Note (Signed)
 Was able to establish care with local pain management doctor recently. However treatment with opiates failed.  Told not much else they can do for him.  Was advised to follow-up with his PCP.

## 2023-07-17 NOTE — Progress Notes (Signed)
 Henry [name] 78 y.o.   Chief Complaint  Patient presents with   Medication Management    Patient states he went to pain management and they told him they couldn't do anything for him. 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 Patient states he needs something to relax him and Lidocaine pain relief patches     HISTORY OF PRESENT ILLNESS: This is a 78 y.o. male with history of chronic pain syndrome has been to pain management clinic several times.  Not a candidate for opiates.  Failed them due to side effects. History of generalized anxiety and chronic depression Requesting something to help him relax and calm down so he can deal with the pain better No other complaints or medical concerns today.  HPI   Prior to Admission medications   Medication Sig Start Date End Date Taking? Authorizing Provider  benazepril-hydrochlorthiazide (LOTENSIN HCT) 20-25 MG tablet TAKE 1 TABLET BY MOUTH EVERY DAY 03/18/23  Yes Kielee Care, Eilleen Kempf, MD  busPIRone (BUSPAR) 7.5 MG tablet Take 1 tablet (7.5 mg total) by mouth 2 (two) times daily. 07/17/23  Yes Sarahbeth Cashin, Eilleen Kempf, MD  cholecalciferol (VITAMIN D) 1000 UNITS tablet Take 1,000 Units by mouth daily.   Yes [provider]  fish oil-omega-3 fatty acids 1000 MG capsule Take 2 g by mouth daily.   Yes [provider]  ibuprofen (ADVIL) 200 MG tablet Take 200 mg by mouth every 6 (six) hours as needed for moderate pain (pain score 4-6).   Yes [provider]  levothyroxine (SYNTHROID) 125 MCG tablet TAKE 1 TABLET BY MOUTH EVERY DAY BEFORE BREAKFAST 03/29/22  Yes Jack Mineau, Eilleen Kempf, MD  LORazepam (ATIVAN) 1 MG tablet TAKE 1 TABLET BY MOUTH EVERY DAY AS NEEDED FOR ANXIETY 07/12/23  Yes Marinna Blane, Eilleen Kempf, MD  tiZANidine (ZANAFLEX) 2 MG tablet TAKE 1 TABLET BY MOUTH EVERY 8 HOURS AS NEEDED FOR MUSCLE SPASM 03/20/23  Yes Fanny Dance, MD  zolpidem (AMBIEN) 10 MG  tablet Take 1 tablet (10 mg total) by mouth at bedtime as needed for sleep. 02/14/23  Yes Georgina Quint, MD    Allergies  Allergen Reactions   Ciprofloxacin Itching and Rash    Patient Active Problem List   Diagnosis Date Noted   Adjustment reaction with anxiety and depression 07/17/2023   Current moderate episode of major depressive disorder without prior episode (HCC) 09/07/2021   Irritable bowel syndrome with diarrhea 09/07/2016   Iron deficiency anemia due to chronic blood loss 09/07/2016   Hypothyroidism due to acquired atrophy of thyroid 08/15/2016   Degenerative disc disease, lumbar 03/16/2014   Insomnia 03/16/2014   Chronic pain syndrome 03/16/2014   Hypothyroidism 05/02/2012   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   Essential hypertension 07/19/2007    Past Medical History:  Diagnosis Date   Anxiety    Arthritis    Degenerative disc disease, lumbar    Hypertension    Hypothyroidism    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   CRWON SCALP- [place] MOHS    Past Surgical History:  Procedure Laterality Date   CHOLECYSTECTOMY     COLON SURGERY  08/07/2006   colostomy & small bowel resection   FRACTURE SURGERY     left shoulder   SPINE SURGERY     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    Social History   Socioeconomic History  Marital status: Married    Spouse name: Not on file   Number of children: Not on file   Years of education: Not on file   Highest education level: Not on file  Occupational History   Not on file  Tobacco Use   Smoking status: Never    Passive exposure: Never   Smokeless tobacco: Never  Vaping Use   Vaping status: Not on file  Substance and Sexual Activity   Alcohol use: Not on file   Drug use: Not on file   Sexual activity: Yes  Other Topics Concern   Not on file  Social History Narrative   Not on file   Social Drivers of Health    Financial Resource Strain: Low Risk  (07/17/2023)   Overall Financial Resource Strain (CARDIA)    Difficulty of Paying Living Expenses: Not hard at all  Food Insecurity: No Food Insecurity (07/17/2023)   Hunger Vital Sign    Worried About Running Out of Food in the Last Year: Never true    Ran Out of Food in the Last Year: Never true  Transportation Needs: No Transportation Needs (07/17/2023)   PRAPARE - Administrator, Civil Service (Medical): No    Lack of Transportation (Non-Medical): No  Physical Activity: Inactive (07/17/2023)   Exercise Vital Sign    Days of Exercise per Week: 0 days    Minutes of Exercise per Session: 0 min  Stress: Stress Concern Present (07/17/2023)   [employer] of Occupational Health - Occupational Stress Questionnaire    Feeling of Stress : Very much  Social Connections: Socially Integrated (07/17/2023)   Social Connection and Isolation Panel [NHANES]    Frequency of Communication with Friends and Family: More than three times a week    Frequency of Social Gatherings with Friends and Family: More than three times a week    Attends Religious Services: More than 4 times per year    Active Member of [employer] or Organizations: Yes    Attends Engineer, structural: More than 4 times per year    Marital Status: Married  Catering manager Violence: Not At Risk (07/17/2023)   Humiliation, Afraid, Rape, and Kick questionnaire    Fear of Current or Ex-Partner: No    Emotionally Abused: No    Physically Abused: No    Sexually Abused: No    Family History  Problem Relation Age of Onset   Heart disease Mother        CAD and CHF     Review of Systems  Constitutional: Negative.  Negative for chills and fever.  HENT:  Negative for congestion and sore throat.   Respiratory: Negative.  Negative for cough and shortness of breath.   Cardiovascular: Negative.  Negative for chest pain.  Gastrointestinal:  Negative for abdominal pain, diarrhea, nausea  and vomiting.  Genitourinary: Negative.  Negative for dysuria and hematuria.  Musculoskeletal:  Positive for back pain, joint pain and myalgias.  Skin: Negative.  Negative for rash.  Neurological: Negative.  Negative for dizziness and headaches.  All other systems reviewed and are negative.   Vitals:   07/17/23 0912  BP: (!) 142/80  Pulse: 78  Temp: 97.7 F (36.5 C)  SpO2: 96%    Physical Exam Vitals reviewed.  Constitutional:      Appearance: Normal appearance.  HENT:     Head: Normocephalic.     Mouth/Throat:     Mouth: Mucous membranes are moist.     Pharynx:  Oropharynx is clear.  Eyes:     Extraocular Movements: Extraocular movements intact.     Pupils: Pupils are equal, round, and reactive to light.  Cardiovascular:     Rate and Rhythm: Normal rate and regular rhythm.     Pulses: Normal pulses.     Heart sounds: Normal heart sounds.  Pulmonary:     Effort: Pulmonary effort is normal.     Breath sounds: Normal breath sounds.  Musculoskeletal:     Cervical back: No tenderness.  Lymphadenopathy:     Cervical: No cervical adenopathy.  Skin:    General: Skin is warm and dry.     Capillary Refill: Capillary refill takes less than 2 seconds.  Neurological:     General: No focal deficit present.     Mental Status: He is alert and oriented to person, place, and time.  Psychiatric:        Mood and Affect: Mood normal.        Behavior: Behavior normal.      ASSESSMENT & PLAN: A total of 42 minutes was spent with the patient and counseling/coordination of care regarding preparing for this visit, review of most recent office visit notes, review of multiple chronic medical conditions and their management, review of all medications, review of most recent bloodwork results, review of health maintenance items, education on nutrition, prognosis, documentation, and need for follow up.   Problem List Items Addressed This Visit       Cardiovascular and Mediastinum    Essential hypertension   BP Readings from Last 3 Encounters:  07/17/23 (!) 142/80  07/17/23 128/62  02/14/23 110/64  Well-controlled hypertension Continue Lotensin HCTZ 20-25 mg daily Cardiovascular risks associated with hypertension discussed         Endocrine   Hypothyroidism   Clinically euthyroid. Continue Synthroid 125 mcg daily.         Other   Chronic pain syndrome   Was able to establish care with local pain management doctor recently. However treatment with opiates failed.  Told not much else they can do for him.  Was advised to follow-up with his PCP.      Relevant Medications   DULoxetine (CYMBALTA) 30 MG capsule   Current moderate episode of major depressive disorder without prior episode (HCC)   Aggravated by chronic pain syndrome. May benefit from starting Cymbalta 30 mg daily.      Relevant Medications   busPIRone (BUSPAR) 7.5 MG tablet   DULoxetine (CYMBALTA) 30 MG capsule   Adjustment reaction with anxiety and depression - Primary   Chronic anxiety and depression affecting quality of life Recommend to start BuSpar 7.5 mg twice a day May benefit from behavioral health evaluation      Relevant Medications   busPIRone (BUSPAR) 7.5 MG tablet   DULoxetine (CYMBALTA) 30 MG capsule   Patient Instructions  Health Maintenance After Age 33 After age 10, you are at a higher risk for certain long-term diseases and infections as well as injuries from falls. Falls are a major cause of broken bones and head injuries in people who are older than age 62. Getting regular preventive care can help to keep you healthy and well. Preventive care includes getting regular testing and making lifestyle changes as recommended by your health care provider. Talk with your health care provider about: Which screenings and tests you should have. A screening is a test that checks for a disease when you have no symptoms. A diet and exercise plan that is  right for you. What should I  know about screenings and tests to prevent falls? Screening and testing are the best ways to find a health problem early. Early diagnosis and treatment give you the best chance of managing medical conditions that are common after age 102. Certain conditions and lifestyle choices may make you more likely to have a fall. Your health care provider may recommend: Regular vision checks. Poor vision and conditions such as cataracts can make you more likely to have a fall. If you wear glasses, make sure to get your prescription updated if your vision changes. Medicine review. Work with your health care provider to regularly review all of the medicines you are taking, including over-the-counter medicines. Ask your health care provider about any side effects that may make you more likely to have a fall. Tell your health care provider if any medicines that you take make you feel dizzy or sleepy. Strength and balance checks. Your health care provider may recommend certain tests to check your strength and balance while standing, walking, or changing positions. Foot health exam. Foot pain and numbness, as well as not wearing proper footwear, can make you more likely to have a fall. Screenings, including: Osteoporosis screening. Osteoporosis is a condition that causes the bones to get weaker and break more easily. Blood pressure screening. Blood pressure changes and medicines to control blood pressure can make you feel dizzy. Depression screening. You may be more likely to have a fall if you have a fear of falling, feel depressed, or feel unable to do activities that you used to do. Alcohol use screening. Using too much alcohol can affect your balance and may make you more likely to have a fall. Follow these instructions at home: Lifestyle Do not drink alcohol if: Your health care provider tells you not to drink. If you drink alcohol: Limit how much you have to: 0-1 drink a day for women. 0-2 drinks a day for  men. Know how much alcohol is in your drink. In the [place] one drink equals one 12 oz bottle of beer (355 mL), one 5 oz glass of wine (148 mL), or one 1 oz glass of hard liquor (44 mL). Do not use any products that contain nicotine or tobacco. These products include cigarettes, chewing tobacco, and vaping devices, such as e-cigarettes. If you need help quitting, ask your health care provider. Activity  Follow a regular exercise program to stay fit. This will help you maintain your balance. Ask your health care provider what types of exercise are appropriate for you. If you need a cane or walker, use it as recommended by your health care provider. Wear supportive shoes that have nonskid soles. Safety  Remove any tripping hazards, such as rugs, cords, and clutter. Install safety equipment such as grab bars in bathrooms and safety rails on stairs. Keep rooms and walkways well-lit. General instructions Talk with your health care provider about your risks for falling. Tell your health care provider if: You fall. Be sure to tell your health care provider about all falls, even ones that seem minor. You feel dizzy, tiredness (fatigue), or off-balance. Take over-the-counter and prescription medicines only as told by your health care provider. These include supplements. Eat a healthy diet and maintain a healthy weight. A healthy diet includes low-fat dairy products, low-fat (lean) meats, and fiber from whole grains, beans, and lots of fruits and vegetables. Stay current with your vaccines. Schedule regular health, dental, and eye exams. Summary Having a healthy lifestyle  and getting preventive care can help to protect your health and wellness after age 23. Screening and testing are the best way to find a health problem early and help you avoid having a fall. Early diagnosis and treatment give you the best chance for managing medical conditions that are more common for people who are older than age  73. Falls are a major cause of broken bones and head injuries in people who are older than age 44. Take precautions to prevent a fall at home. Work with your health care provider to learn what changes you can make to improve your health and wellness and to prevent falls. This information is not intended to replace advice given to you by your health care provider. Make sure you discuss any questions you have with your health care provider. Document Revised: 08/23/2020 Document Reviewed: 08/23/2020 Elsevier Patient Education  2024 Elsevier Inc.       Edwina Barth, MD [facility]

## 2023-07-17 NOTE — Progress Notes (Signed)
 Subjective:   Henry [name] is a 78 y.o. who presents for a Medicare Wellness preventive visit.  Visit Complete: In person  Persons Participating in Visit: Patient.  AWV Questionnaire: No: Patient Medicare AWV questionnaire was not completed prior to this visit.  Cardiac Risk Factors include: advanced age (>80men, >70 women);hypertension;male gender;obesity (BMI >30kg/m2)     Objective:    Today's Vitals   07/17/23 0853  BP: 128/62  Pulse: 68  SpO2: 98%  Weight: 180 lb (81.6 kg)  Height: 5\' 4"  (1.626 m)  PainLoc: Ankle   Body mass index is 30.9 kg/m.     07/17/2023    8:48 AM 07/13/2022    9:51 AM 06/11/2019    9:13 AM 02/20/2014    9:47 AM  Advanced Directives  Does Patient Have a Medical Advance Directive? No Yes Yes No  Type of Special educational needs teacher of [place];Living will Healthcare Power of Attorney   Copy of Healthcare Power of Attorney in Chart?  No - copy requested No - copy requested   Would patient like information on creating a medical advance directive? No - Patient declined       Current Medications (verified) Outpatient Encounter Medications as of 07/17/2023  Medication Sig   benazepril-hydrochlorthiazide (LOTENSIN HCT) 20-25 MG tablet TAKE 1 TABLET BY MOUTH EVERY DAY   cholecalciferol (VITAMIN D) 1000 UNITS tablet Take 1,000 Units by mouth daily.   fish oil-omega-3 fatty acids 1000 MG capsule Take 2 g by mouth daily.   ibuprofen (ADVIL) 200 MG tablet Take 200 mg by mouth every 6 (six) hours as needed for moderate pain (pain score 4-6).   levothyroxine (SYNTHROID) 125 MCG tablet TAKE 1 TABLET BY MOUTH EVERY DAY BEFORE BREAKFAST   LORazepam (ATIVAN) 1 MG tablet TAKE 1 TABLET BY MOUTH EVERY DAY AS NEEDED FOR ANXIETY   tiZANidine (ZANAFLEX) 2 MG tablet TAKE 1 TABLET BY MOUTH EVERY 8 HOURS AS NEEDED FOR MUSCLE SPASM   zolpidem (AMBIEN) 10 MG tablet Take 1 tablet (10 mg total) by mouth at bedtime as needed for sleep.   [DISCONTINUED]  diclofenac sodium (VOLTAREN) 1 % GEL Apply 4 g topically 4 (four) times daily.   [DISCONTINUED] NARCAN 4 MG/0.1ML LIQD nasal spray kit as directed Nasally in case of overdose: 1 acuation in one nostril x 1 , may repeat every 2-3 mins until pt is responsive for 30 days   No facility-administered encounter medications on file as of 07/17/2023.    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   Family History  Problem Relation Age of Onset   Heart disease Mother        CAD and CHF   Social History   Socioeconomic History   Marital status: Married    Spouse name: Not on file   Number of children: Not on file   Years of education: Not on file   Highest education level: Not on file  Occupational History  Not on file  Tobacco Use   Smoking status: Never    Passive exposure: Never   Smokeless tobacco: Never  Vaping Use   Vaping status: Not on file  Substance and Sexual Activity   Alcohol use: Not on file   Drug use: Not on file   Sexual activity: Yes  Other Topics Concern   Not on file  Social History Narrative   Not on file   Social Drivers of Health   Financial Resource Strain: Low Risk  (07/17/2023)   Overall Financial Resource Strain (CARDIA)    Difficulty of Paying Living Expenses: Not hard at all  Food Insecurity: No Food Insecurity (07/17/2023)   Hunger Vital Sign    Worried About Running Out of Food in the Last Year: Never true    Ran Out of Food in the Last Year: Never true  Transportation Needs: No Transportation Needs  (07/17/2023)   PRAPARE - Administrator, Civil Service (Medical): No    Lack of Transportation (Non-Medical): No  Physical Activity: Inactive (07/17/2023)   Exercise Vital Sign    Days of Exercise per Week: 0 days    Minutes of Exercise per Session: 0 min  Stress: Stress Concern Present (07/17/2023)   [employer] of Occupational Health - Occupational Stress Questionnaire    Feeling of Stress : Very much  Social Connections: Socially Integrated (07/17/2023)   Social Connection and Isolation Panel [NHANES]    Frequency of Communication with Friends and Family: More than three times a week    Frequency of Social Gatherings with Friends and Family: More than three times a week    Attends Religious Services: More than 4 times per year    Active Member of [employer] or Organizations: Yes    Attends Engineer, structural: More than 4 times per year    Marital Status: Married    Tobacco Counseling Counseling given: No    Clinical Intake:  Pre-visit preparation completed: Yes  Pain : 0-10 Pain Location: Shoulder Pain Orientation: Left, Lower, Right (left shoulder; low back; right ankle) Pain Descriptors / Indicators: Constant Pain Onset: More than a month ago Pain Frequency: Constant Pain Relieving Factors: Ibuprofen  Pain Relieving Factors: Ibuprofen  BMI - recorded: 30.9 Nutritional Risks: None Diabetes: No  Lab Results  Component Value Date   HGBA1C 5.8 10/31/2022   HGBA1C 5.7 (H) 01/16/2017     How often do you need to have someone help you when you read instructions, pamphlets, or other written materials from your doctor or pharmacy?: 1 - Never  Interpreter Needed?: No  Information entered by :: Hassell Halim, CMA   Activities of Daily Living     07/17/2023    8:57 AM  In your present state of health, do you have any difficulty performing the following activities:  Hearing? 0  Vision? 0  Difficulty concentrating or making decisions? 0  Walking or  climbing stairs? 0  Dressing or bathing? 0  Doing errands, shopping? 0  Preparing Food and eating ? N  Using the Toilet? N  In the past six months, have you accidently leaked urine? N  Do you have problems with loss of bowel control? N  Managing your Medications? N  Managing your Finances? N  Housekeeping or managing your Housekeeping? N    Patient Care Team: Georgina Quint, MD as PCP - General (Internal Medicine)  Indicate any recent Medical Services you may have received from other than [facility] providers in the  past year (date may be approximate).     Assessment:   This is a routine wellness examination for Kelyn.  Hearing/Vision screen Hearing Screening - Comments:: Denies hearing difficulties   Vision Screening - Comments:: Wears rx glasses - up to date with routine eye exams with Dr Caryn Section   Goals Addressed               This Visit's Progress     Patient Stated (pt-stated)        Patient stated he plans to manage his joint pain and continue living.       Depression Screen     07/17/2023    9:02 AM 02/14/2023    2:21 PM 02/13/2023    9:20 AM 12/12/2022    9:11 AM 07/13/2022    9:02 AM 09/07/2021    1:46 PM 05/18/2020    4:12 PM  PHQ 2/9 Scores  PHQ - 2 Score 2 0 2 4 1 6  0  PHQ- 9 Score 9   10 3 21      Fall Risk     07/17/2023    8:59 AM 02/14/2023    2:21 PM 02/13/2023    9:20 AM 01/15/2023   11:17 AM 12/12/2022    9:16 AM  Fall Risk   Falls in the past year? 0 0 0 0 0  Number falls in past yr: 0 0 0  0  Injury with Fall? 0 0 0  0  Risk for fall due to : No Fall Risks No Fall Risks     Follow up Falls prevention discussed;Falls evaluation completed Falls evaluation completed       MEDICARE RISK AT HOME:  Medicare Risk at Home Any stairs in or around the home?: Yes (outside) If so, are there any without handrails?: No Home free of loose throw rugs in walkways, pet beds, electrical cords, etc?: Yes Adequate lighting in your home to reduce risk of  falls?: Yes Life alert?: No Use of a cane, walker or w/c?: Yes (cane) Grab bars in the bathroom?: Yes Shower chair or bench in shower?: No Elevated toilet seat or a handicapped toilet?: No  TIMED UP AND GO:  Was the test performed?  No  Cognitive Function: 6CIT completed        07/17/2023    9:00 AM 07/13/2022   10:57 AM 06/11/2019    9:13 AM 06/11/2019    9:12 AM  6CIT Screen  What Year? 0 points 0 points 0 points 0 points  What month? 0 points 0 points 0 points 0 points  What time? 0 points 0 points 0 points 0 points  Count back from 20 0 points 0 points 0 points 0 points  Months in reverse 0 points 0 points 0 points 0 points  Repeat phrase 2 points 0 points 0 points 0 points  Total Score 2 points 0 points 0 points 0 points    Immunizations Immunization History  Administered Date(s) Administered   PFIZER(Purple Top)SARS-COV-2 Vaccination 06/18/2019, 07/16/2019   Td 04/17/2004   Zoster Recombinant(Shingrix) 02/04/2018, 04/11/2018   Zoster, Live 04/11/2018    Screening Tests Health Maintenance  Topic Date Due   Zoster Vaccines- Shingrix (2 of 2) 06/06/2018   COVID-19 Vaccine (3 - 2024-25 season) 12/17/2022   Pneumonia Vaccine 70+ Years old (1 of 1 - PCV) 10/31/2023 (Originally 04/26/2012)   INFLUENZA VACCINE  11/16/2023   Medicare Annual Wellness (AWV)  07/16/2024   Hepatitis C Screening  Completed   HPV  VACCINES  Aged Out   DTaP/Tdap/Td  Discontinued   Colonoscopy  Discontinued    Health Maintenance  Health Maintenance Due  Topic Date Due   Zoster Vaccines- Shingrix (2 of 2) 06/06/2018   COVID-19 Vaccine (3 - 2024-25 season) 12/17/2022   Health Maintenance Items Addressed: 07/17/2023  Pt stated had Shingles vaccines at local pharmacy.  Advised pt to get a copy to PCP.  Additional Screening:  Vision Screening: Recommended annual ophthalmology exams for early detection of glaucoma and other disorders of the eye. Pt stated has annual eye exam with Dr  Caryn Section.  Dental Screening: Recommended annual dental exams for proper oral hygiene  Community Resource Referral / Chronic Care Management: CRR required this visit?  No   CCM required this visit?  No     Plan:     I have personally reviewed and noted the following in the patient's chart:   Medical and social history Use of alcohol, tobacco or illicit drugs  Current medications and supplements including opioid prescriptions. Patient is not currently taking opioid prescriptions. Functional ability and status Nutritional status Physical activity Advanced directives List of other physicians Hospitalizations, surgeries, and ER visits in previous 12 months Vitals Screenings to include cognitive, depression, and falls Referrals and appointments  In addition, I have reviewed and discussed with patient certain preventive protocols, quality metrics, and best practice recommendations. A written personalized care plan for preventive services as well as general preventive health recommendations were provided to patient.     Darreld Mclean, CMA   07/17/2023   After Visit Summary: Due to In-person visit, patient will review information via MyChart.  Notes:  Scheduled an appt for the pt w/PCP to discuss pain and medications.

## 2023-07-17 NOTE — Assessment & Plan Note (Signed)
 Chronic anxiety and depression affecting quality of life Recommend to start BuSpar 7.5 mg twice a day May benefit from behavioral health evaluation

## 2023-07-17 NOTE — Assessment & Plan Note (Signed)
 BP Readings from Last 3 Encounters:  07/17/23 (!) 142/80  07/17/23 128/62  02/14/23 110/64  Well-controlled hypertension Continue Lotensin HCTZ 20-25 mg daily Cardiovascular risks associated with hypertension discussed

## 2023-07-18 NOTE — Telephone Encounter (Signed)
Okay to prescribe lidocaine patches?

## 2023-07-27 ENCOUNTER — Encounter (HOSPITAL_COMMUNITY): Payer: Self-pay | Admitting: *Deleted

## 2023-07-27 ENCOUNTER — Emergency Department (HOSPITAL_COMMUNITY)

## 2023-07-27 ENCOUNTER — Other Ambulatory Visit: Payer: Self-pay

## 2023-07-27 ENCOUNTER — Telehealth: Payer: Self-pay | Admitting: Pharmacy Technician

## 2023-07-27 ENCOUNTER — Other Ambulatory Visit: Payer: Self-pay | Admitting: Radiology

## 2023-07-27 ENCOUNTER — Inpatient Hospital Stay (HOSPITAL_COMMUNITY)
Admission: EM | Admit: 2023-07-27 | Discharge: 2023-07-30 | DRG: 371 | Disposition: A | Discharge status: Home / Self Care | Attending: Internal Medicine | Admitting: Internal Medicine

## 2023-07-27 ENCOUNTER — Other Ambulatory Visit (HOSPITAL_COMMUNITY): Payer: Self-pay

## 2023-07-27 DIAGNOSIS — N400 Enlarged prostate without lower urinary tract symptoms: Secondary | ICD-10-CM | POA: Diagnosis present

## 2023-07-27 DIAGNOSIS — A020 Salmonella enteritis: Secondary | ICD-10-CM | POA: Diagnosis not present

## 2023-07-27 DIAGNOSIS — E860 Dehydration: Secondary | ICD-10-CM | POA: Diagnosis present

## 2023-07-27 DIAGNOSIS — E039 Hypothyroidism, unspecified: Secondary | ICD-10-CM | POA: Diagnosis present

## 2023-07-27 DIAGNOSIS — Z79890 Hormone replacement therapy (postmenopausal): Secondary | ICD-10-CM

## 2023-07-27 DIAGNOSIS — G894 Chronic pain syndrome: Secondary | ICD-10-CM | POA: Diagnosis present

## 2023-07-27 DIAGNOSIS — R197 Diarrhea, unspecified: Secondary | ICD-10-CM | POA: Diagnosis present

## 2023-07-27 DIAGNOSIS — F5104 Psychophysiologic insomnia: Secondary | ICD-10-CM | POA: Diagnosis present

## 2023-07-27 DIAGNOSIS — F419 Anxiety disorder, unspecified: Secondary | ICD-10-CM | POA: Diagnosis present

## 2023-07-27 DIAGNOSIS — R1084 Generalized abdominal pain: Secondary | ICD-10-CM | POA: Diagnosis present

## 2023-07-27 DIAGNOSIS — N179 Acute kidney failure, unspecified: Principal | ICD-10-CM | POA: Diagnosis present

## 2023-07-27 DIAGNOSIS — E872 Acidosis, unspecified: Secondary | ICD-10-CM | POA: Diagnosis present

## 2023-07-27 DIAGNOSIS — Z881 Allergy status to other antibiotic agents status: Secondary | ICD-10-CM

## 2023-07-27 DIAGNOSIS — R932 Abnormal findings on diagnostic imaging of liver and biliary tract: Secondary | ICD-10-CM | POA: Diagnosis not present

## 2023-07-27 DIAGNOSIS — N390 Urinary tract infection, site not specified: Secondary | ICD-10-CM

## 2023-07-27 DIAGNOSIS — I10 Essential (primary) hypertension: Secondary | ICD-10-CM | POA: Diagnosis present

## 2023-07-27 DIAGNOSIS — Z79899 Other long term (current) drug therapy: Secondary | ICD-10-CM

## 2023-07-27 DIAGNOSIS — E876 Hypokalemia: Secondary | ICD-10-CM

## 2023-07-27 DIAGNOSIS — Z85820 Personal history of malignant melanoma of skin: Secondary | ICD-10-CM

## 2023-07-27 DIAGNOSIS — N170 Acute kidney failure with tubular necrosis: Secondary | ICD-10-CM | POA: Diagnosis present

## 2023-07-27 DIAGNOSIS — M542 Cervicalgia: Secondary | ICD-10-CM

## 2023-07-27 DIAGNOSIS — N281 Cyst of kidney, acquired: Secondary | ICD-10-CM | POA: Diagnosis present

## 2023-07-27 DIAGNOSIS — R0600 Dyspnea, unspecified: Secondary | ICD-10-CM | POA: Diagnosis present

## 2023-07-27 DIAGNOSIS — I959 Hypotension, unspecified: Secondary | ICD-10-CM

## 2023-07-27 DIAGNOSIS — E871 Hypo-osmolality and hyponatremia: Secondary | ICD-10-CM

## 2023-07-27 DIAGNOSIS — R935 Abnormal findings on diagnostic imaging of other abdominal regions, including retroperitoneum: Secondary | ICD-10-CM | POA: Diagnosis not present

## 2023-07-27 DIAGNOSIS — Z0389 Encounter for observation for other suspected diseases and conditions ruled out: Secondary | ICD-10-CM | POA: Diagnosis not present

## 2023-07-27 DIAGNOSIS — Z9049 Acquired absence of other specified parts of digestive tract: Secondary | ICD-10-CM

## 2023-07-27 DIAGNOSIS — Z96661 Presence of right artificial ankle joint: Secondary | ICD-10-CM | POA: Diagnosis present

## 2023-07-27 DIAGNOSIS — Z8249 Family history of ischemic heart disease and other diseases of the circulatory system: Secondary | ICD-10-CM

## 2023-07-27 DIAGNOSIS — F32A Depression, unspecified: Secondary | ICD-10-CM | POA: Diagnosis present

## 2023-07-27 LAB — CBC WITH DIFFERENTIAL/PLATELET
Abs Immature Granulocytes: 0.03 10*3/uL (ref 0.00–0.07)
Basophils Absolute: 0 10*3/uL (ref 0.0–0.1)
Basophils Relative: 1 %
Eosinophils Absolute: 0 10*3/uL (ref 0.0–0.5)
Eosinophils Relative: 0 %
HCT: 50.2 % (ref 39.0–52.0)
Hemoglobin: 16.7 g/dL (ref 13.0–17.0)
Immature Granulocytes: 1 %
Lymphocytes Relative: 19 %
Lymphs Abs: 1.2 10*3/uL (ref 0.7–4.0)
MCH: 27.5 pg (ref 26.0–34.0)
MCHC: 33.3 g/dL (ref 30.0–36.0)
MCV: 82.6 fL (ref 80.0–100.0)
Monocytes Absolute: 1.1 10*3/uL — ABNORMAL HIGH (ref 0.1–1.0)
Monocytes Relative: 17 %
Neutro Abs: 4.1 10*3/uL (ref 1.7–7.7)
Neutrophils Relative %: 62 %
Platelets: 255 10*3/uL (ref 150–400)
RBC: 6.08 MIL/uL — ABNORMAL HIGH (ref 4.22–5.81)
RDW: 14.1 % (ref 11.5–15.5)
WBC: 6.6 10*3/uL (ref 4.0–10.5)
nRBC: 0 % (ref 0.0–0.2)

## 2023-07-27 LAB — COMPREHENSIVE METABOLIC PANEL WITH GFR
ALT: 11 U/L (ref 0–44)
AST: 14 U/L — ABNORMAL LOW (ref 15–41)
Albumin: 3.6 g/dL (ref 3.5–5.0)
Alkaline Phosphatase: 52 U/L (ref 38–126)
Anion gap: 17 — ABNORMAL HIGH (ref 5–15)
BUN: 57 mg/dL — ABNORMAL HIGH (ref 8–23)
CO2: 17 mmol/L — ABNORMAL LOW (ref 22–32)
Calcium: 9.3 mg/dL (ref 8.9–10.3)
Chloride: 98 mmol/L (ref 98–111)
Creatinine, Ser: 7.45 mg/dL — ABNORMAL HIGH (ref 0.61–1.24)
GFR, Estimated: 7 mL/min — ABNORMAL LOW (ref >60)
Glucose, Bld: 117 mg/dL — ABNORMAL HIGH (ref 70–99)
Potassium: 3.2 mmol/L — ABNORMAL LOW (ref 3.5–5.1)
Sodium: 132 mmol/L — ABNORMAL LOW (ref 135–145)
Total Bilirubin: 1.2 mg/dL (ref 0.0–1.2)
Total Protein: 7.1 g/dL (ref 6.5–8.1)

## 2023-07-27 LAB — I-STAT CG4 LACTIC ACID, ED
Lactic Acid, Venous: 2.1 mmol/L (ref 0.5–1.9)
Lactic Acid, Venous: 2.2 mmol/L (ref 0.5–1.9)

## 2023-07-27 LAB — URINALYSIS, W/ REFLEX TO CULTURE (INFECTION SUSPECTED)
Glucose, UA: 100 mg/dL — AB
Ketones, ur: NEGATIVE mg/dL
Leukocytes,Ua: NEGATIVE
Nitrite: POSITIVE — AB
Protein, ur: 100 mg/dL — AB
Specific Gravity, Urine: >1.03 — ABNORMAL HIGH (ref 1.005–1.030)
pH: 5 (ref 5.0–8.0)

## 2023-07-27 LAB — C DIFFICILE QUICK SCREEN W PCR REFLEX
C Diff antigen: NEGATIVE
C Diff interpretation: NOT DETECTED
C Diff toxin: NEGATIVE

## 2023-07-27 LAB — TROPONIN I (HIGH SENSITIVITY)
Troponin I (High Sensitivity): 15 ng/L (ref <18)
Troponin I (High Sensitivity): 14 ng/L (ref <18)

## 2023-07-27 LAB — PROTIME-INR
INR: 1.1 (ref 0.8–1.2)
Prothrombin Time: 14.9 s (ref 11.4–15.2)

## 2023-07-27 LAB — MAGNESIUM: Magnesium: 2.3 mg/dL (ref 1.7–2.4)

## 2023-07-27 LAB — LIPASE, BLOOD: Lipase: 54 U/L — ABNORMAL HIGH (ref 11–51)

## 2023-07-27 MED ORDER — ZOLPIDEM TARTRATE 5 MG PO TABS: 10.0000 mg | ORAL_TABLET | Freq: Once | ORAL | Status: DC

## 2023-07-27 MED ORDER — SODIUM CHLORIDE 0.9 % IV BOLUS
1000.0000 mL | Freq: Once | INTRAVENOUS | Status: AC
  Administered 2023-07-27: 1000 mL via INTRAVENOUS

## 2023-07-27 MED ORDER — LIDOCAINE 5 % EX PTCH
1.0000 | MEDICATED_PATCH | CUTANEOUS | 3 refills | Status: DC
Start: 2023-07-27 — End: 2023-12-17

## 2023-07-27 MED ORDER — LOPERAMIDE HCL 2 MG PO CAPS
4.0000 mg | ORAL_CAPSULE | Freq: Once | ORAL | Status: AC
  Administered 2023-07-27: 4 mg via ORAL
  Filled 2023-07-27: qty 2

## 2023-07-27 MED ORDER — FENTANYL CITRATE PF 50 MCG/ML IJ SOSY
50.0000 ug | PREFILLED_SYRINGE | Freq: Once | INTRAMUSCULAR | Status: AC
  Administered 2023-07-27: 50 ug via INTRAVENOUS
  Filled 2023-07-27: qty 1

## 2023-07-27 MED ORDER — ONDANSETRON HCL 4 MG/2ML IJ SOLN
4.0000 mg | Freq: Once | INTRAMUSCULAR | Status: AC
  Administered 2023-07-27: 4 mg via INTRAVENOUS
  Filled 2023-07-27: qty 2

## 2023-07-27 NOTE — Telephone Encounter (Signed)
 Pharmacy Patient Advocate Encounter   Received notification from CoverMyMeds that prior authorization for Lidocaine 5% patches is required/requested.   Insurance verification completed.   The patient is insured through [facility] .   Per test claim: PA required; PA submitted to above mentioned insurance via CoverMyMeds Key/confirmation #/EOC [id number] Status is pending

## 2023-07-27 NOTE — ED Provider Triage Note (Signed)
 Emergency Medicine Provider Triage Evaluation Note  Henry [name] , a 78 y.o. male  was evaluated in triage.  Pt complains of food poisoning.  Patient reports that he got food poisoning last Tuesday has been having diarrhea every day since then.  He also endorses pain in his right lower abdomen.  He feels generalized weakness and short of breath with ambulation.  Initial blood pressure in triage was 125/88, repeat BP was 70/55.  Review of Systems  Positive: Abdominal pain, weakness Negative: Chest pain  Physical Exam  BP 125/88 (BP Location: Right Arm)   Pulse 79   Temp (!) 97.5 F (36.4 C)   Resp 20   Ht 5\' 4"  (1.626 m)   Wt 81.2 kg   SpO2 100%   BMI 30.73 kg/m  Gen:   Awake, no distress   Resp:  Normal effort  MSK:   Moves extremities without difficulty  Other:    Medical Decision Making  Medically screening exam initiated at 6:38 PM.  Appropriate orders placed.  Henry [name] was informed that the remainder of the evaluation will be completed by another provider, this initial triage assessment does not replace that evaluation, and the importance of remaining in the ED until their evaluation is complete.    Henry Marion, PA-C 07/27/23 1845

## 2023-07-27 NOTE — ED Notes (Signed)
 ED TO INPATIENT HANDOFF REPORT  ED Nurse Name and Phone #: Hansel Starling [phone]  S Name/Age/Gender Henry [name] 78 y.o. male Room/Bed: 015C/015C  Code Status   Code Status: Prior  Home/SNF/Other Home Patient oriented to: self, place, time, and situation Is this baseline? Yes   Triage Complete: Triage complete  Chief Complaint AKI (acute kidney injury) (HCC) [N17.9]  Triage Note The pt reports that he has had food poisoning on Tuesday  he cannot or drink and he has had numerus surgeries  in the past and he is c/o weakness   Allergies Allergies  Allergen Reactions   Ciprofloxacin Itching and Rash    Level of Care/Admitting Diagnosis ED Disposition     ED Disposition  Admit   Condition  --   Comment  Hospital Area: [facility] [[id number]]  Level of Care: Telemetry Medical [104]  May place patient in observation at [facility] or [facility] if equivalent level of care is available:: Yes  Covid Evaluation: Asymptomatic - no recent exposure (last 10 days) testing not required  Diagnosis: AKI (acute kidney injury) 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   CRWON SCALP- [place] MOHS   Past Surgical History:  Procedure Laterality Date   CHOLECYSTECTOMY     COLON SURGERY  08/07/2006   colostomy & small bowel resection   FRACTURE SURGERY     left shoulder   SPINE SURGERY     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     A IV Location/Drains/Wounds Patient Lines/Drains/Airways Status     Active Line/Drains/Airways     Name Placement date Placement time Site Days   Peripheral IV 07/27/23 20 G Anterior;Right Forearm 07/27/23  1904  Forearm  less than 1             Intake/Output Last 24 hours  Intake/Output Summary (Last 24 hours) at 07/27/2023 2349 Last data filed at 07/27/2023 2245 Gross per 24 hour  Intake 3000 ml  Output --  Net 3000 ml    Labs/Imaging Results for orders placed or performed during the hospital encounter of 07/27/23 (from the past 48 hours)  Comprehensive metabolic panel     Status: Abnormal   Collection Time: 07/27/23  7:05 PM  Result Value Ref Range   Sodium 132 (L) 135 - 145 mmol/L   Potassium 3.2 (L) 3.5 - 5.1 mmol/L   Chloride 98 98 - 111 mmol/L   CO2 17 (L) 22 - 32 mmol/L   Glucose, Bld 117 (H) 70 - 99 mg/dL    Comment: Glucose reference range applies only to samples taken after fasting for at least 8 hours.   BUN 57 (H) 8 - 23 mg/dL   Creatinine, Ser 0.86 (H) 0.61 - 1.24 mg/dL   Calcium 9.3 8.9 - 57.8 mg/dL   Total Protein 7.1 6.5 - 8.1 g/dL   Albumin 3.6 3.5 - 5.0 g/dL   AST 14 (L) 15 - 41 U/L   ALT 11 0 - 44 U/L   Alkaline Phosphatase 52 38 - 126 U/L   Total Bilirubin 1.2 0.0 - 1.2 mg/dL   GFR, Estimated 7 (L) >60 mL/min    Comment: (NOTE) Calculated  using the CKD-EPI Creatinine Equation (2021)    Anion gap 17 (H) 5 - 15    Comment: Performed at [facility], [address], [place], [place] [address]  CBC with Differential     Status: Abnormal   Collection Time: 07/27/23  7:05 PM  Result Value Ref Range   WBC 6.6 4.0 - 10.5 K/uL   RBC 6.08 (H) 4.22 - 5.81 MIL/uL   Hemoglobin 16.7 13.0 - 17.0 g/dL   HCT 29.5 62.1 - 30.8 %   MCV 82.6 80.0 - 100.0 fL   MCH 27.5 26.0 - 34.0 pg   MCHC 33.3 30.0 - 36.0 g/dL   RDW 65.7 84.6 - 96.2 %   Platelets 255 150 - 400 K/uL   nRBC 0.0 0.0 - 0.2 %   Neutrophils Relative % 62 %   Neutro Abs 4.1 1.7 - 7.7 K/uL   Lymphocytes Relative 19 %   Lymphs Abs 1.2 0.7 - 4.0 K/uL   Monocytes Relative 17 %   Monocytes Absolute 1.1 (H) 0.1 - 1.0 K/uL   Eosinophils Relative 0 %   Eosinophils Absolute 0.0 0.0 - 0.5 K/uL   Basophils Relative 1 %    Basophils Absolute 0.0 0.0 - 0.1 K/uL   Immature Granulocytes 1 %   Abs Immature Granulocytes 0.03 0.00 - 0.07 K/uL    Comment: Performed at [facility], [address], [place], [place] [address]  Protime-INR     Status: None   Collection Time: 07/27/23  7:05 PM  Result Value Ref Range   Prothrombin Time 14.9 11.4 - 15.2 seconds   INR 1.1 0.8 - 1.2    Comment: (NOTE) INR goal varies based on device and disease states. Performed at [facility], [address], [place], [place] [address]   Troponin I (High Sensitivity)     Status: None   Collection Time: 07/27/23  7:13 PM  Result Value Ref Range   Troponin I (High Sensitivity) 15 <18 ng/L    Comment: (NOTE) Elevated high sensitivity troponin I (hsTnI) values and significant  changes across serial measurements may suggest ACS but many other  chronic and acute conditions are known to elevate hsTnI results.  Refer to the "Links" section for chest pain algorithms and additional  guidance. Performed at [facility], [address], [place], [place] [address]   Lipase, blood     Status: Abnormal   Collection Time: 07/27/23  7:13 PM  Result Value Ref Range   Lipase 54 (H) 11 - 51 U/L    Comment: Performed at [facility], [address] [place], [place] [address]  Magnesium     Status: None   Collection Time: 07/27/23  7:13 PM  Result Value Ref Range   Magnesium 2.3 1.7 - 2.4 mg/dL    Comment: Performed at [facility], [address] [place], [place] [address]  I-Stat Lactic Acid, ED     Status: Abnormal   Collection Time: 07/27/23  7:15 PM  Result Value Ref Range   Lactic Acid, Venous 2.1 (HH) 0.5 - 1.9 mmol/L   Comment NOTIFIED PHYSICIAN   Urinalysis, w/ Reflex to Culture (Infection Suspected) -Urine, Clean Catch     Status: Abnormal   Collection Time: 07/27/23  8:45 PM  Result Value Ref Range   Specimen Source URINE, CATHETERIZED    Color, Urine YELLOW YELLOW   APPearance CLOUDY (A) CLEAR    Specific Gravity, Urine >  1.030 (H) 1.005 - 1.030   pH 5.0 5.0 - 8.0   Glucose, UA 100 (A) NEGATIVE mg/dL   Hgb urine dipstick SMALL (A) NEGATIVE   Bilirubin Urine MODERATE (A) NEGATIVE   Ketones, ur NEGATIVE NEGATIVE mg/dL   Protein, ur 161 (A) NEGATIVE mg/dL   Nitrite POSITIVE (A) NEGATIVE   Leukocytes,Ua NEGATIVE NEGATIVE   Squamous Epithelial / HPF 0-5 0 - 5 /HPF   WBC, UA 6-10 0 - 5 WBC/hpf    Comment: Reflex urine culture not performed if WBC <=10, OR if Squamous epithelial cells >5. If Squamous epithelial cells >5, suggest recollection.   RBC / HPF 0-5 0 - 5 RBC/hpf   Bacteria, UA MANY (A) NONE SEEN   Mucus PRESENT    Hyaline Casts, UA PRESENT    Urine-Other LESS THAN 10 mL OF URINE SUBMITTED     Comment: MICROSCOPIC EXAM PERFORMED ON UNCONCENTRATED URINE Performed at [facility], [address] [place], [place] [address]   C Difficile Quick Screen w PCR reflex     Status: None   Collection Time: 07/27/23  8:45 PM   Specimen: STOOL  Result Value Ref Range   C Diff antigen NEGATIVE NEGATIVE   C Diff toxin NEGATIVE NEGATIVE   C Diff interpretation No C. difficile detected.     Comment: Performed at [facility], [address], [place], [place] [address]  Troponin I (High Sensitivity)     Status: None   Collection Time: 07/27/23  8:50 PM  Result Value Ref Range   Troponin I (High Sensitivity) 14 <18 ng/L    Comment: (NOTE) Elevated high sensitivity troponin I (hsTnI) values and significant  changes across serial measurements may suggest ACS but many other  chronic and acute conditions are known to elevate hsTnI results.  Refer to the "Links" section for chest pain algorithms and additional  guidance. Performed at [facility], [address], [place], [place] [address]   I-Stat Lactic Acid, ED     Status: Abnormal   Collection Time: 07/27/23  9:04 PM  Result Value Ref Range   Lactic Acid, Venous 2.2 (HH) 0.5 - 1.9 mmol/L   Comment NOTIFIED  PHYSICIAN    CT ABDOMEN PELVIS WO CONTRAST Result Date: 07/27/2023 CLINICAL DATA:  Diarrhea and abdominal pain EXAM: CT ABDOMEN AND PELVIS WITHOUT CONTRAST TECHNIQUE: Multidetector CT imaging of the abdomen and pelvis was performed following the standard protocol without IV contrast. RADIATION DOSE REDUCTION: This exam was performed according to the departmental dose-optimization program which includes automated exposure control, adjustment of the mA and/or kV according to patient size and/or use of iterative reconstruction technique. COMPARISON:  CT abdomen and pelvis 08/03/2021 FINDINGS: Lower chest: There is atelectasis in the lung bases. Hepatobiliary: There are 3 rounded hypodense areas in the liver which are unchanged from the prior examination, possibly cysts or hemangiomas. These measure up to 16 mm. Gallbladder surgically absent. There is no biliary ductal dilatation. Pancreas: Unremarkable. No pancreatic ductal dilatation or surrounding inflammatory changes. Spleen: Normal in size. Rounded hypodensity in the inferior spleen is unchanged from prior measuring 15 mm, possibly a cyst. Adrenals/Urinary Tract: The bladder is decompressed. No urinary tract calculi or hydronephrosis. Left renal cysts are unchanged from prior measuring up to 2 cm. Within the inferior pole the left kidney there is a 3.7 x 3.3 cm mildly hyperdense area which appears similar to prior. The adrenal glands are within normal limits. Stomach/Bowel: A partial colectomy changes are  present. Small bowel anastomosis is present in the left abdomen. There is no evidence for bowel obstruction. The appendix is within normal limits. In stomach is within normal limits. Vascular/Lymphatic: Aortic atherosclerosis. No enlarged abdominal or pelvic lymph nodes. Reproductive: Prostate gland is enlarged with nodular protrusion into the bladder base. This is similar to prior. Other: There is bulging of the anterior abdominal wall with rectus diastasis  containing nondilated small bowel loops. There also smaller supraumbilical fat containing ventral hernias. There is no ascites. Musculoskeletal: L5-S1 posterior fusion hardware is present. IMPRESSION: 1. No acute localizing process in the abdomen or pelvis. 2. Stable hypodense areas in the liver and spleen, possibly cysts. 3. Stable left renal cysts. Stable mildly hyperdense area in the inferior pole of the left kidney, possibly a hemorrhagic cyst. This can be further evaluated with renal ultrasound. 4. Stable rectus diastasis containing nondilated small bowel loops. 5. Stable enlarged prostate gland with nodular protrusion into the bladder base. 6. Aortic atherosclerosis. Electronically Signed   By: Darliss Cheney M.D.   On: 07/27/2023 22:34   DG Chest 2 View Result Date: 07/27/2023 CLINICAL DATA:  Suspected sepsis EXAM: CHEST - 2 VIEW COMPARISON:  08/03/2021 FINDINGS: Heart and mediastinal contours are within normal limits. No focal opacities or effusions. No acute bony abnormality. IMPRESSION: No active cardiopulmonary disease. Electronically Signed   By: Charlett Nose M.D.   On: 07/27/2023 20:19    Pending Labs Unresulted Labs (From admission, onward)     Start     Ordered   07/27/23 2000  Gastrointestinal Panel by PCR , Stool  (Gastrointestinal Panel by PCR, Stool                                                                                                                                                     **Does Not include CLOSTRIDIUM DIFFICILE testing. **If CDIFF testing is needed, place order from the "C Difficile Testing" order set.**)  Once,   URGENT        07/27/23 1959   07/27/23 1815  Culture, blood (Routine x 2)  BLOOD CULTURE X 2,   R      07/27/23 1815            Vitals/Pain Today's Vitals   07/27/23 2115 07/27/23 2145 07/27/23 2200 07/27/23 2245  BP: (!) 92/56 108/80 (!) 105/90 (!) 96/58  Pulse: 77 77 77 76  Resp:  16 19 19   Temp:  97.9 F (36.6 C)    TempSrc:  Oral     SpO2: 100% 100% 100% 100%  Weight:      Height:      PainSc:  4       Isolation Precautions Enteric precautions (UV disinfection)  Medications Medications  zolpidem (AMBIEN) tablet 10 mg (has no administration in time range)  sodium chloride 0.9 %  bolus 1,000 mL (0 mLs Intravenous Stopped 07/27/23 2141)  loperamide (IMODIUM) capsule 4 mg (4 mg Oral Given 07/27/23 2007)  fentaNYL (SUBLIMAZE) injection 50 mcg (50 mcg Intravenous Given 07/27/23 2007)  ondansetron (ZOFRAN) injection 4 mg (4 mg Intravenous Given 07/27/23 2007)  sodium chloride 0.9 % bolus 1,000 mL (0 mLs Intravenous Stopped 07/27/23 2245)  sodium chloride 0.9 % bolus 1,000 mL (0 mLs Intravenous Stopped 07/27/23 2245)    Mobility walks with device     Focused Assessments Cardiac Assessment Handoff:    Lab Results  Component Value Date   CKTOTAL 57 03/26/2014   No results found for: "DDIMER" Does the Patient currently have chest pain? No    R Recommendations: See Admitting Provider Note  Report given to:   Additional Notes:

## 2023-07-27 NOTE — ED Triage Notes (Signed)
 The pt reports that he has had food poisoning on Tuesday  he cannot or drink and he has had numerus surgeries  in the past and he is c/o weakness

## 2023-07-27 NOTE — ED Provider Notes (Addendum)
 [place] EMERGENCY DEPARTMENT AT [facility] Provider Note   CSN: [id number] Arrival date & time: 07/27/23  1755     History  Chief Complaint  Patient presents with   food poisoning    JEN EPPINGER is a 78 y.o. male history of cholecystectomy, previous bile leak and colon perforation status post multiple surgeries, here presenting with abdominal pain and diarrhea.  Patient states that he went out to eat 3 days ago.  Since then he has been having 10-15 episodes of diarrhea every day.  He took up to 5 Imodium's yesterday.  Patient states that he has been having severe cramps and diarrhea.  He states that he also has some subjective shortness of breath as well.  Patient's wife is sick with similar symptoms but her symptoms resolved within 24 hours.  Patient does have history of chronic pain and is on pain medicine.  Patient was noted to be hypotensive in triage and was brought back to the room.  The history is provided by the patient.       Home Medications Prior to Admission medications   Medication Sig Start Date End Date Taking? Authorizing Provider  acetaminophen (TYLENOL) 650 MG CR tablet Take 325-600 mg by mouth every 8 (eight) hours as needed for pain.   Yes [provider]  benazepril-hydrochlorthiazide (LOTENSIN HCT) 20-25 MG tablet TAKE 1 TABLET BY MOUTH EVERY DAY 03/18/23  Yes Sagardia, Eilleen Kempf, MD  bismuth subsalicylate (PEPTO BISMOL) 262 MG/15ML suspension Take 30 mLs by mouth every 6 (six) hours as needed for indigestion or diarrhea or loose stools.   Yes [provider]  cholecalciferol (VITAMIN D) 1000 UNITS tablet Take 1,000 Units by mouth daily.   Yes [provider]  fish oil-omega-3 fatty acids 1000 MG capsule Take 2 g by mouth daily.   Yes [provider]  ibuprofen (ADVIL) 200 MG tablet Take 200 mg by mouth every 6 (six) hours as needed for moderate pain (pain score 4-6), fever or mild pain (pain score 1-3).    Yes [provider]  levothyroxine (SYNTHROID) 125 MCG tablet TAKE 1 TABLET BY MOUTH EVERY DAY BEFORE BREAKFAST Patient taking differently: Take 125 mcg by mouth daily before breakfast. 03/29/22  Yes Sagardia, Eilleen Kempf, MD  Loperamide HCl (IMODIUM PO) Take 1-2 tablets by mouth as needed.   Yes [provider]  LORazepam (ATIVAN) 1 MG tablet TAKE 1 TABLET BY MOUTH EVERY DAY AS NEEDED FOR ANXIETY Patient taking differently: Take 1 mg by mouth 3 times/day as needed-between meals & bedtime for anxiety. 07/12/23  Yes Sagardia, Eilleen Kempf, MD  tamsulosin (FLOMAX) 0.4 MG CAPS capsule Take 0.4 mg by mouth daily after breakfast.   Yes [provider]  zolpidem (AMBIEN) 10 MG tablet Take 1 tablet (10 mg total) by mouth at bedtime as needed for sleep. Patient taking differently: Take 10 mg by mouth at bedtime. 02/14/23  Yes Sagardia, Eilleen Kempf, MD  DULoxetine (CYMBALTA) 30 MG capsule Take 1 capsule (30 mg total) by mouth daily. Patient not taking: Reported on 07/27/2023 07/17/23 08/16/23  Georgina Quint, MD  lidocaine (LIDODERM) 5 % Place 1 patch onto the skin daily. Remove & Discard patch within 12 hours or as directed by MD Patient not taking: Reported on 07/27/2023 07/27/23   Georgina Quint, MD      Allergies    Ciprofloxacin    Review of Systems   Review of Systems  Gastrointestinal:  Positive for abdominal pain  and vomiting.  All other systems reviewed and are negative.   Physical Exam Updated Vital Signs BP 108/80 (BP Location: Right Arm)   Pulse 77   Temp 97.9 F (36.6 C) (Oral)   Resp 16   Ht 5\' 4"  (1.626 m)   Wt 81.2 kg   SpO2 100%   BMI 30.73 kg/m  Physical Exam Vitals and nursing note reviewed.  Constitutional:      Comments: Uncomfortable and dehydrated  HENT:     Head: Normocephalic.     Nose: Nose normal.     Mouth/Throat:     Mouth: Mucous membranes are dry.  Eyes:     Extraocular Movements: Extraocular movements intact.      Pupils: Pupils are equal, round, and reactive to light.  Cardiovascular:     Rate and Rhythm: Normal rate and regular rhythm.     Pulses: Normal pulses.     Heart sounds: Normal heart sounds.  Pulmonary:     Effort: Pulmonary effort is normal.     Breath sounds: Normal breath sounds.  Abdominal:     Comments: Distended and patient has midline incision scar with incisional hernia that is easily reducible.  Patient has diffuse tenderness worse on the right upper quadrant  Musculoskeletal:        General: Normal range of motion.     Cervical back: Neck supple.  Skin:    General: Skin is warm.     Capillary Refill: Capillary refill takes less than 2 seconds.  Neurological:     General: No focal deficit present.  Psychiatric:        Mood and Affect: Mood normal.     ED Results / Procedures / Treatments   Labs (all labs ordered are listed, but only abnormal results are displayed) Labs Reviewed  COMPREHENSIVE METABOLIC PANEL WITH GFR - Abnormal; Notable for the following components:      Result Value   Sodium 132 (*)    Potassium 3.2 (*)    CO2 17 (*)    Glucose, Bld 117 (*)    BUN 57 (*)    Creatinine, Ser 7.45 (*)    AST 14 (*)    GFR, Estimated 7 (*)    Anion gap 17 (*)    All other components within normal limits  CBC WITH DIFFERENTIAL/PLATELET - Abnormal; Notable for the following components:   RBC 6.08 (*)    Monocytes Absolute 1.1 (*)    All other components within normal limits  URINALYSIS, W/ REFLEX TO CULTURE (INFECTION SUSPECTED) - Abnormal; Notable for the following components:   APPearance CLOUDY (*)    Specific Gravity, Urine >1.030 (*)    Glucose, UA 100 (*)    Hgb urine dipstick SMALL (*)    Bilirubin Urine MODERATE (*)    Protein, ur 100 (*)    Nitrite POSITIVE (*)    Bacteria, UA MANY (*)    All other components within normal limits  LIPASE, BLOOD - Abnormal; Notable for the following components:   Lipase 54 (*)    All other components within normal  limits  I-STAT CG4 LACTIC ACID, ED - Abnormal; Notable for the following components:   Lactic Acid, Venous 2.1 (*)    All other components within normal limits  I-STAT CG4 LACTIC ACID, ED - Abnormal; Notable for the following components:   Lactic Acid, Venous 2.2 (*)    All other components within normal limits  C DIFFICILE QUICK SCREEN W PCR REFLEX  CULTURE, BLOOD (ROUTINE X 2)  CULTURE, BLOOD (ROUTINE X 2)  GASTROINTESTINAL PANEL BY PCR, STOOL (REPLACES STOOL CULTURE)  PROTIME-INR  MAGNESIUM  TROPONIN I (HIGH SENSITIVITY)  TROPONIN I (HIGH SENSITIVITY)    EKG EKG Interpretation Date/Time:  Friday July 27 2023 18:14:49 EDT Ventricular Rate:  81 PR Interval:  150 QRS Duration:  82 QT Interval:  374 QTC Calculation: 434 R Axis:   38  Text Interpretation: Normal sinus rhythm Low voltage QRS Borderline ECG When compared with ECG of 03-Aug-2021 16:27, PREVIOUS ECG IS PRESENT Confirmed by Richardean Canal [phone]) on 07/27/2023 7:13:46 PM  Radiology CT ABDOMEN PELVIS WO CONTRAST Result Date: 07/27/2023 CLINICAL DATA:  Diarrhea and abdominal pain EXAM: CT ABDOMEN AND PELVIS WITHOUT CONTRAST TECHNIQUE: Multidetector CT imaging of the abdomen and pelvis was performed following the standard protocol without IV contrast. RADIATION DOSE REDUCTION: This exam was performed according to the departmental dose-optimization program which includes automated exposure control, adjustment of the mA and/or kV according to patient size and/or use of iterative reconstruction technique. COMPARISON:  CT abdomen and pelvis 08/03/2021 FINDINGS: Lower chest: There is atelectasis in the lung bases. Hepatobiliary: There are 3 rounded hypodense areas in the liver which are unchanged from the prior examination, possibly cysts or hemangiomas. These measure up to 16 mm. Gallbladder surgically absent. There is no biliary ductal dilatation. Pancreas: Unremarkable. No pancreatic ductal dilatation or surrounding inflammatory  changes. Spleen: Normal in size. Rounded hypodensity in the inferior spleen is unchanged from prior measuring 15 mm, possibly a cyst. Adrenals/Urinary Tract: The bladder is decompressed. No urinary tract calculi or hydronephrosis. Left renal cysts are unchanged from prior measuring up to 2 cm. Within the inferior pole the left kidney there is a 3.7 x 3.3 cm mildly hyperdense area which appears similar to prior. The adrenal glands are within normal limits. Stomach/Bowel: A partial colectomy changes are present. Small bowel anastomosis is present in the left abdomen. There is no evidence for bowel obstruction. The appendix is within normal limits. In stomach is within normal limits. Vascular/Lymphatic: Aortic atherosclerosis. No enlarged abdominal or pelvic lymph nodes. Reproductive: Prostate gland is enlarged with nodular protrusion into the bladder base. This is similar to prior. Other: There is bulging of the anterior abdominal wall with rectus diastasis containing nondilated small bowel loops. There also smaller supraumbilical fat containing ventral hernias. There is no ascites. Musculoskeletal: L5-S1 posterior fusion hardware is present. IMPRESSION: 1. No acute localizing process in the abdomen or pelvis. 2. Stable hypodense areas in the liver and spleen, possibly cysts. 3. Stable left renal cysts. Stable mildly hyperdense area in the inferior pole of the left kidney, possibly a hemorrhagic cyst. This can be further evaluated with renal ultrasound. 4. Stable rectus diastasis containing nondilated small bowel loops. 5. Stable enlarged prostate gland with nodular protrusion into the bladder base. 6. Aortic atherosclerosis. Electronically Signed   By: Darliss Cheney M.D.   On: 07/27/2023 22:34   DG Chest 2 View Result Date: 07/27/2023 CLINICAL DATA:  Suspected sepsis EXAM: CHEST - 2 VIEW COMPARISON:  08/03/2021 FINDINGS: Heart and mediastinal contours are within normal limits. No focal opacities or effusions. No  acute bony abnormality. IMPRESSION: No active cardiopulmonary disease. Electronically Signed   By: Charlett Nose M.D.   On: 07/27/2023 20:19    Procedures Procedures   CRITICAL CARE Performed by: Richardean Canal   Total critical care time: 39 minutes  Critical care time was exclusive of separately billable procedures and treating other patients.  Critical care was necessary to treat or prevent imminent or life-threatening deterioration.  Critical care was time spent personally by me on the following activities: development of treatment plan with patient and/or surrogate as well as nursing, discussions with consultants, evaluation of patient's response to treatment, examination of patient, obtaining history from patient or surrogate, ordering and performing treatments and interventions, ordering and review of laboratory studies, ordering and review of radiographic studies, pulse oximetry and re-evaluation of patient's condition.    Medications Ordered in ED Medications  sodium chloride 0.9 % bolus 1,000 mL (0 mLs Intravenous Stopped 07/27/23 2141)  loperamide (IMODIUM) capsule 4 mg (4 mg Oral Given 07/27/23 2007)  fentaNYL (SUBLIMAZE) injection 50 mcg (50 mcg Intravenous Given 07/27/23 2007)  ondansetron (ZOFRAN) injection 4 mg (4 mg Intravenous Given 07/27/23 2007)  sodium chloride 0.9 % bolus 1,000 mL (0 mLs Intravenous Stopped 07/27/23 2245)  sodium chloride 0.9 % bolus 1,000 mL (0 mLs Intravenous Stopped 07/27/23 2245)    ED Course/ Medical Decision Making/ A&P Clinical Course as of 07/27/23 2326  Fri Jul 27, 2023  2324 Handoff DY 78 yo male Here w/ diarrhea, x15 episodes daily w/ nausea Cr 7.45, prior wnl Cdiff neg, gi panel pending Got 3L NS Nephro recommending fluids, recheck tomorrow   [SG]    Clinical Course User Index [SG] Sloan Leiter, DO                                 Medical Decision Making TAIKI BUCKWALTER is a 78 y.o. male here presenting with abdominal pain and  diarrhea.  Patient ate at the restaurant 3 days ago and wife is sick with similar symptoms.  I think likely viral gastroenteritis.  Also consider small bowel obstruction given that he had multiple abdominal surgeries.  Patient was hypotensive so sepsis workup was initiated in triage.  Plan to get CBC and CMP and lactate and cultures and chest x-ray and CT abdomen pelvis.  Will hydrate patient and reassess   10:55 PM I reviewed patient's labs and patient's creatinine is 7.5 and anion gap is 17 and bicarb is 17.  Potassium is low at 3.2 likely from diarrhea.  Patient has negative C. difficile and GI pathogen panel is pending.  CT abdomen pelvis showed no obstruction.  Discussed case with Dr. Marisue Humble from nephrology.  He recommend IV fluids and hospitalist admission.  He states that hospitalist can consult in the morning if the kidney function does not improve.  Patient was briefly hypotensive and improved with IV fluids.  Problems Addressed: AKI (acute kidney injury) (HCC): acute illness or injury Diarrhea, unspecified type: acute illness or injury  Amount and/or Complexity of Data Reviewed Labs: ordered. Decision-making details documented in ED Course. Radiology: ordered and independent interpretation performed. Decision-making details documented in ED Course.  Risk Prescription drug management. Decision regarding hospitalization.    Final Clinical Impression(s) / ED Diagnoses Final diagnoses:  AKI (acute kidney injury) (HCC)  Diarrhea, unspecified type    Rx / DC Orders ED Discharge Orders     None         Charlynne Pander, MD 07/27/23 2301    Charlynne Pander, MD 07/27/23 2326

## 2023-07-27 NOTE — Telephone Encounter (Signed)
 Rx has been sent. And LVM informing patient

## 2023-07-27 NOTE — Telephone Encounter (Signed)
 Pharmacy Patient Advocate Encounter  Received notification from [facility] that Prior Authorization for Lidocaine 5% patches  has been DENIED.  Full denial letter will be uploaded to the media tab. See denial reason below.      PA #/Case ID/Reference #: [id number]

## 2023-07-27 NOTE — ED Notes (Signed)
 CCMD notified for continuous cardiac monitoring.

## 2023-07-28 DIAGNOSIS — E871 Hypo-osmolality and hyponatremia: Secondary | ICD-10-CM

## 2023-07-28 DIAGNOSIS — I959 Hypotension, unspecified: Secondary | ICD-10-CM | POA: Diagnosis not present

## 2023-07-28 DIAGNOSIS — N170 Acute kidney failure with tubular necrosis: Secondary | ICD-10-CM | POA: Diagnosis not present

## 2023-07-28 DIAGNOSIS — Z79899 Other long term (current) drug therapy: Secondary | ICD-10-CM | POA: Diagnosis not present

## 2023-07-28 DIAGNOSIS — E039 Hypothyroidism, unspecified: Secondary | ICD-10-CM | POA: Diagnosis not present

## 2023-07-28 DIAGNOSIS — R1084 Generalized abdominal pain: Secondary | ICD-10-CM | POA: Diagnosis not present

## 2023-07-28 DIAGNOSIS — N390 Urinary tract infection, site not specified: Secondary | ICD-10-CM | POA: Diagnosis not present

## 2023-07-28 DIAGNOSIS — E876 Hypokalemia: Secondary | ICD-10-CM | POA: Diagnosis not present

## 2023-07-28 DIAGNOSIS — Z881 Allergy status to other antibiotic agents status: Secondary | ICD-10-CM | POA: Diagnosis not present

## 2023-07-28 DIAGNOSIS — Z9049 Acquired absence of other specified parts of digestive tract: Secondary | ICD-10-CM | POA: Diagnosis not present

## 2023-07-28 DIAGNOSIS — N281 Cyst of kidney, acquired: Secondary | ICD-10-CM | POA: Diagnosis not present

## 2023-07-28 DIAGNOSIS — Z79890 Hormone replacement therapy (postmenopausal): Secondary | ICD-10-CM | POA: Diagnosis not present

## 2023-07-28 DIAGNOSIS — N179 Acute kidney failure, unspecified: Secondary | ICD-10-CM

## 2023-07-28 DIAGNOSIS — I10 Essential (primary) hypertension: Secondary | ICD-10-CM | POA: Diagnosis not present

## 2023-07-28 DIAGNOSIS — E872 Acidosis, unspecified: Secondary | ICD-10-CM | POA: Diagnosis not present

## 2023-07-28 DIAGNOSIS — F419 Anxiety disorder, unspecified: Secondary | ICD-10-CM | POA: Diagnosis not present

## 2023-07-28 DIAGNOSIS — Z96661 Presence of right artificial ankle joint: Secondary | ICD-10-CM | POA: Diagnosis not present

## 2023-07-28 DIAGNOSIS — F32A Depression, unspecified: Secondary | ICD-10-CM | POA: Diagnosis not present

## 2023-07-28 DIAGNOSIS — E860 Dehydration: Secondary | ICD-10-CM | POA: Diagnosis not present

## 2023-07-28 DIAGNOSIS — F5104 Psychophysiologic insomnia: Secondary | ICD-10-CM | POA: Diagnosis not present

## 2023-07-28 DIAGNOSIS — N400 Enlarged prostate without lower urinary tract symptoms: Secondary | ICD-10-CM | POA: Diagnosis not present

## 2023-07-28 DIAGNOSIS — A020 Salmonella enteritis: Secondary | ICD-10-CM | POA: Diagnosis not present

## 2023-07-28 DIAGNOSIS — R197 Diarrhea, unspecified: Secondary | ICD-10-CM | POA: Diagnosis not present

## 2023-07-28 DIAGNOSIS — G894 Chronic pain syndrome: Secondary | ICD-10-CM | POA: Diagnosis not present

## 2023-07-28 DIAGNOSIS — Z85820 Personal history of malignant melanoma of skin: Secondary | ICD-10-CM | POA: Diagnosis not present

## 2023-07-28 DIAGNOSIS — R0600 Dyspnea, unspecified: Secondary | ICD-10-CM | POA: Diagnosis not present

## 2023-07-28 LAB — BASIC METABOLIC PANEL WITH GFR
Anion gap: 14 (ref 5–15)
BUN: 51 mg/dL — ABNORMAL HIGH (ref 8–23)
CO2: 13 mmol/L — ABNORMAL LOW (ref 22–32)
Calcium: 8 mg/dL — ABNORMAL LOW (ref 8.9–10.3)
Chloride: 107 mmol/L (ref 98–111)
Creatinine, Ser: 4.47 mg/dL — ABNORMAL HIGH (ref 0.61–1.24)
GFR, Estimated: 13 mL/min — ABNORMAL LOW (ref >60)
Glucose, Bld: 82 mg/dL (ref 70–99)
Potassium: 3.2 mmol/L — ABNORMAL LOW (ref 3.5–5.1)
Sodium: 134 mmol/L — ABNORMAL LOW (ref 135–145)

## 2023-07-28 LAB — GASTROINTESTINAL PANEL BY PCR, STOOL (REPLACES STOOL CULTURE)

## 2023-07-28 LAB — LACTIC ACID, PLASMA: Lactic Acid, Venous: 1 mmol/L (ref 0.5–1.9)

## 2023-07-28 MED ORDER — POTASSIUM CHLORIDE CRYS ER 20 MEQ PO TBCR
40.0000 meq | EXTENDED_RELEASE_TABLET | Freq: Two times a day (BID) | ORAL | Status: DC
  Administered 2023-07-28 – 2023-07-29 (×4): 40 meq via ORAL
  Filled 2023-07-28 (×4): qty 2

## 2023-07-28 MED ORDER — LEVOTHYROXINE SODIUM 25 MCG PO TABS
125.0000 ug | ORAL_TABLET | Freq: Every day | ORAL | Status: DC
  Administered 2023-07-28 – 2023-07-30 (×3): 125 ug via ORAL
  Filled 2023-07-28 (×3): qty 1

## 2023-07-28 MED ORDER — LOPERAMIDE HCL 2 MG PO CAPS: 2.0000 mg | ORAL_CAPSULE | ORAL | Status: DC | PRN

## 2023-07-28 MED ORDER — ACETAMINOPHEN 650 MG RE SUPP: 650.0000 mg | Freq: Four times a day (QID) | RECTAL | Status: DC | PRN

## 2023-07-28 MED ORDER — SODIUM CHLORIDE 0.9 % IV SOLN: INTRAVENOUS | Status: DC

## 2023-07-28 MED ORDER — TAMSULOSIN HCL 0.4 MG PO CAPS
0.4000 mg | ORAL_CAPSULE | Freq: Every day | ORAL | Status: DC
  Administered 2023-07-28: 0.4 mg via ORAL
  Filled 2023-07-28: qty 1

## 2023-07-28 MED ORDER — LOPERAMIDE HCL 2 MG PO CAPS
2.0000 mg | ORAL_CAPSULE | ORAL | Status: DC | PRN
  Administered 2023-07-28: 2 mg via ORAL
  Filled 2023-07-28 (×2): qty 1

## 2023-07-28 MED ORDER — ZOLPIDEM TARTRATE 5 MG PO TABS
5.0000 mg | ORAL_TABLET | Freq: Once | ORAL | Status: AC
  Administered 2023-07-28: 5 mg via ORAL
  Filled 2023-07-28: qty 1

## 2023-07-28 MED ORDER — SODIUM CHLORIDE 0.9 % IV SOLN
1.0000 g | INTRAVENOUS | Status: DC
  Administered 2023-07-28 – 2023-07-29 (×2): 1 g via INTRAVENOUS
  Filled 2023-07-28 (×3): qty 10

## 2023-07-28 MED ORDER — LORAZEPAM 1 MG PO TABS: 1.0000 mg | ORAL_TABLET | Freq: Two times a day (BID) | ORAL | Status: DC | PRN

## 2023-07-28 MED ORDER — POTASSIUM CHLORIDE CRYS ER 20 MEQ PO TBCR
40.0000 meq | EXTENDED_RELEASE_TABLET | Freq: Once | ORAL | Status: AC
  Administered 2023-07-28: 40 meq via ORAL
  Filled 2023-07-28: qty 2

## 2023-07-28 MED ORDER — ACETAMINOPHEN 325 MG PO TABS: 650.0000 mg | ORAL_TABLET | Freq: Four times a day (QID) | ORAL | Status: DC | PRN

## 2023-07-28 MED ORDER — ZOLPIDEM TARTRATE 5 MG PO TABS
5.0000 mg | ORAL_TABLET | Freq: Every evening | ORAL | Status: DC | PRN
Start: 2023-07-28 — End: 2023-07-30
  Administered 2023-07-28 – 2023-07-29 (×2): 5 mg via ORAL
  Filled 2023-07-28 (×2): qty 1

## 2023-07-28 NOTE — Progress Notes (Signed)
 Patient seen and examined.  Very anxious due to 15 times a day loose stool and butt is sore.  Denies any nausea.  Appetite is poor.  Has some right upper quadrant abdominal pain.  Denies any bleeding into the stool. In brief, 78 year old with history of hypertension hypothyroidism and chronic pain syndrome and anxiety depression, history of cholecystectomy and previous bile leak and colon perforation, multiple abdominal wall hernias presented to the emergency room with loose watery stool 15 times a day, abdominal cramping and shortness of breath.  Blood pressure 70/55 on arrival.  Potassium 3.2.  Creatinine 7.4 with recent baseline creatinine normal.  Admitted with AKI, severe dehydration, diarrhea.  Wife also had transient diarrhea.  Urinalysis was abnormal, however patient urinating very low with diarrhea.   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  Salmonella positive on GI pathogen panel.  No evidence of systemic infection.  Plan: Continue isotonic fluid.  Monitor output.  Renal function already improving.  Holding diuretics and lisinopril. Encourage oral intake.  Replace potassium. Can use Imodium. No indication for antibiotics for noninvasive salmonellosis.  Anticipate conservative management. Recheck electrolytes and renal functions tomorrow morning.

## 2023-07-28 NOTE — Progress Notes (Signed)
 Pt GI panel came back positive for salmonella. MD notified

## 2023-07-28 NOTE — Plan of Care (Signed)
  Problem: Coping: Goal: Level of anxiety will decrease Outcome: Progressing   Problem: Elimination: Goal: Will not experience complications related to bowel motility Outcome: Progressing   Problem: Pain Managment: Goal: General experience of comfort will improve and/or be controlled Outcome: Progressing

## 2023-07-28 NOTE — H&P (Signed)
 History and Physical    BODEN STUCKY ZOX:[id number] DOB: 1945-05-13 DOA: 07/27/2023  PCP: Elvira Hammersmith, MD  Patient coming from: Home  Chief Complaint: Diarrhea  HPI: Henry [name] is a 78 y.o. male with medical history significant of hypertension, hypothyroidism, melanoma, lumbar degenerative disc disease, chronic pain syndrome, anxiety, depression, history of cholecystectomy, previous bile leak and colon perforation status post multiple surgeries presented to the ED with complaints of diarrhea, abdominal pain, and shortness of breath.  Found to be hypotensive on arrival with blood pressure 70/55 but remainder of vital signs stable.  Labs showing no leukocytosis or anemia, sodium 132, potassium 3.2, magnesium 2.3, bicarb 17, anion gap 17, glucose 117, BUN 57, creatinine 7.4, GFR 7 (baseline creatinine 1.0), lactic acid 2.1> 2.2, blood cultures collected, troponin negative x 2, lipase 54 and no elevation of LFTs, C. difficile antigen and toxin negative, GI pathogen panel pending.  UA with 100 protein, positive nitrite, and microscopy showing many bacteria, mucus, and hyaline casts.  Chest x-ray showing no active cardiopulmonary disease.  CT abdomen pelvis showing no acute abnormality in the abdomen or pelvis.  Showing stable hypodense areas in the liver and spleen, possibly cysts.  Stable left renal cysts and stable mildly hyperdense area in the inferior pole of the left kidney, possibly a hemorrhagic cyst.  No evidence of urinary tract calculi or hydronephrosis. ED physician discussed the case with Dr. Jearldine Mina from nephrology who recommended continuing IV fluid hydration and monitoring labs.  Requested hospitalist service to formally consult nephrology if renal function does not improve on repeat labs.  Patient was given fentanyl, Imodium, Zofran, and 3 L normal saline.  Hypotension resolved after IV fluids.  [facility] called to admit.  It was difficult to get history from the patient as he  was very upset about his chronic insomnia and repeatedly requesting Ambien.  He is reporting 4-day history of profuse watery diarrhea, nausea, and vomiting.  Symptoms started after he ate at a restaurant with his wife.  His wife was also sick with similar symptoms but has recovered now.  He is no longer having nausea or vomiting but continues to have profuse nonbloody diarrhea.  He is reporting chronic generalized abdominal pain due to history of multiple abdominal surgeries in the past.  Patient states he takes ibuprofen at home for his chronic pain and has been seen by pain management and did not want to be started on any opiates.  Also takes benazepril and hydrochlorothiazide for hypertension.  Reports subjective fevers.  He is endorsing dysuria.  Denies noticing blood in his urine.  He is reporting shortness of breath for the past day or two but denies chest pain.  Review of Systems:  Review of Systems  All other systems reviewed and are negative.   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 He has never been exposed to tobacco smoke. He has never used smokeless tobacco. He reports current  alcohol use. He reports that he does not use drugs.  Allergies  Allergen Reactions   Ciprofloxacin Itching and Rash    Family History  Problem Relation Age of Onset   Heart disease Mother        CAD and CHF    Prior to Admission medications   Medication Sig Start Date End Date Taking? Authorizing Provider  acetaminophen (TYLENOL) 650 MG CR tablet Take 325-600 mg  by mouth every 8 (eight) hours as needed for pain.   Yes [provider]  benazepril-hydrochlorthiazide (LOTENSIN HCT) 20-25 MG tablet TAKE 1 TABLET BY MOUTH EVERY DAY 03/18/23  Yes Sagardia, Isidro Margo, MD  bismuth subsalicylate (PEPTO BISMOL) 262 MG/15ML suspension Take 30 mLs by mouth every 6 (six) hours as needed for indigestion or diarrhea or loose stools.   Yes [provider]  cholecalciferol (VITAMIN D) 1000 UNITS tablet Take 1,000 Units by mouth daily.   Yes [provider]  fish oil-omega-3 fatty acids 1000 MG capsule Take 2 g by mouth daily.   Yes [provider]  ibuprofen (ADVIL) 200 MG tablet Take 200 mg by mouth every 6 (six) hours as needed for moderate pain (pain score 4-6), fever or mild pain (pain score 1-3).   Yes [provider]  levothyroxine (SYNTHROID) 125 MCG tablet TAKE 1 TABLET BY MOUTH EVERY DAY BEFORE BREAKFAST Patient taking differently: Take 125 mcg by mouth daily before breakfast. 03/29/22  Yes Sagardia, Miguel Jose, MD  Loperamide HCl (IMODIUM PO) Take 1-2 tablets by mouth as needed.   Yes [provider]  LORazepam (ATIVAN) 1 MG tablet TAKE 1 TABLET BY MOUTH EVERY DAY AS NEEDED FOR ANXIETY Patient taking differently: Take 1 mg by mouth 3 times/day as needed-between meals & bedtime for anxiety. 07/12/23  Yes Sagardia, Miguel Jose, MD  tamsulosin (FLOMAX) 0.4 MG CAPS capsule Take 0.4 mg by mouth daily after breakfast.   Yes [provider]  zolpidem (AMBIEN) 10 MG tablet Take 1 tablet (10 mg total) by mouth at bedtime as needed for sleep. Patient taking differently: Take 10 mg by mouth at bedtime. 02/14/23  Yes Sagardia, Isidro Margo, MD  DULoxetine (CYMBALTA) 30 MG capsule Take 1 capsule (30 mg total) by mouth daily. Patient not taking: Reported on 07/27/2023 07/17/23 08/16/23  Elvira Hammersmith, MD  lidocaine (LIDODERM) 5 % Place 1 patch onto the skin daily. Remove & Discard patch within 12 hours or as  directed by MD Patient not taking: Reported on 07/27/2023 07/27/23   Sagardia, Miguel Jose, MD    Physical Exam: Vitals:   07/27/23 2145 07/27/23 2200 07/27/23 2245 07/28/23 0034  BP: 108/80 (!) 105/90 (!) 96/58 (!) 110/56  Pulse: 77 77 76 78  Resp: 16 19 19    Temp: 97.9 F (36.6 C)   (!) 97.4 F (36.3 C)  TempSrc: Oral   Oral  SpO2: 100% 100% 100%   Weight:      Height:        Physical Exam Vitals reviewed.  Constitutional:      General: He is not in acute distress. HENT:     Head: Normocephalic and atraumatic.  Eyes:     Extraocular Movements: Extraocular movements intact.  Cardiovascular:     Rate and Rhythm: Normal rate and regular rhythm.     Pulses: Normal pulses.  Pulmonary:     Effort: Pulmonary effort is normal. No respiratory distress.     Breath sounds: Normal breath sounds. No wheezing or rales.  Abdominal:     General: Bowel sounds are normal. There is no distension.     Palpations: Abdomen is soft.     Tenderness: There is abdominal tenderness.     Comments: Generalized tenderness to palpation  Musculoskeletal:     Cervical back: Normal range of motion.     Right lower leg: No edema.     Left lower leg: No edema.  Skin:    General: Skin is warm and dry.  Neurological:     General: No focal deficit present.     Mental Status: He is alert and oriented to person, place, and time.     Labs on Admission: I have personally reviewed following labs and imaging studies  CBC: Recent Labs  Lab 07/27/23 1905  WBC 6.6  NEUTROABS 4.1  HGB 16.7  HCT 50.2  MCV 82.6  PLT 255   Basic Metabolic Panel: Recent Labs  Lab 07/27/23 1905 07/27/23 1913  NA 132*  --   K 3.2*  --   CL 98  --   CO2 17*  --   GLUCOSE 117*  --   BUN 57*  --   CREATININE 7.45*  --   CALCIUM 9.3  --   MG  --  2.3   GFR: Estimated Creatinine Clearance: 8.1 mL/min (A) (by C-G formula based on SCr of 7.45 mg/dL (H)). Liver Function Tests: Recent Labs  Lab 07/27/23 1905  AST  14*  ALT 11  ALKPHOS 52  BILITOT 1.2  PROT 7.1  ALBUMIN 3.6   Recent Labs  Lab 07/27/23 1913  LIPASE 54*   No results for input(s): "AMMONIA" in the last 168 hours. Coagulation Profile: Recent Labs  Lab 07/27/23 1905  INR 1.1   Cardiac Enzymes: No results for input(s): "CKTOTAL", "CKMB", "CKMBINDEX", "TROPONINI" in the last 168 hours. BNP (last 3 results) No results for input(s): "PROBNP" in the last 8760 hours. HbA1C: No results for input(s): "HGBA1C" in the last 72 hours. CBG: No results for input(s): "GLUCAP" in the last 168 hours. Lipid Profile: No results for input(s): "CHOL", "HDL", "LDLCALC", "TRIG", "CHOLHDL", "LDLDIRECT" in the last 72 hours. Thyroid Function Tests: No results for input(s): "TSH", "T4TOTAL", "FREET4", "T3FREE", "THYROIDAB" in the last 72 hours. Anemia Panel: No results for input(s): "VITAMINB12", "FOLATE", "FERRITIN", "TIBC", "IRON", "RETICCTPCT" in the last 72 hours. Urine analysis:    Component Value Date/Time   COLORURINE YELLOW 07/27/2023 2045   APPEARANCEUR CLOUDY (A) 07/27/2023 2045   LABSPEC >1.030 (H) 07/27/2023 2045   PHURINE 5.0 07/27/2023 2045   GLUCOSEU 100 (A) 07/27/2023 2045   HGBUR SMALL (A) 07/27/2023 2045   BILIRUBINUR MODERATE (A) 07/27/2023 2045   BILIRUBINUR negative 07/11/2017 1022   BILIRUBINUR neg 03/11/2014 1145   KETONESUR NEGATIVE 07/27/2023 2045   PROTEINUR 100 (A) 07/27/2023 2045   UROBILINOGEN 0.2 07/11/2017 1022   UROBILINOGEN 0.2 02/20/2014 1037   NITRITE POSITIVE (A) 07/27/2023 2045   LEUKOCYTESUR NEGATIVE 07/27/2023 2045    Radiological Exams on Admission: CT ABDOMEN PELVIS WO CONTRAST Result Date: 07/27/2023 CLINICAL DATA:  Diarrhea and abdominal pain EXAM: CT ABDOMEN AND PELVIS WITHOUT CONTRAST TECHNIQUE: Multidetector CT imaging of the abdomen and pelvis was performed following the standard protocol without IV contrast. RADIATION DOSE REDUCTION: This exam was performed according to the departmental  dose-optimization program which includes automated exposure control, adjustment of the mA and/or kV according to patient size and/or use of iterative reconstruction technique. COMPARISON:  CT abdomen and pelvis 08/03/2021 FINDINGS: Lower chest: There  is atelectasis in the lung bases. Hepatobiliary: There are 3 rounded hypodense areas in the liver which are unchanged from the prior examination, possibly cysts or hemangiomas. These measure up to 16 mm. Gallbladder surgically absent. There is no biliary ductal dilatation. Pancreas: Unremarkable. No pancreatic ductal dilatation or surrounding inflammatory changes. Spleen: Normal in size. Rounded hypodensity in the inferior spleen is unchanged from prior measuring 15 mm, possibly a cyst. Adrenals/Urinary Tract: The bladder is decompressed. No urinary tract calculi or hydronephrosis. Left renal cysts are unchanged from prior measuring up to 2 cm. Within the inferior pole the left kidney there is a 3.7 x 3.3 cm mildly hyperdense area which appears similar to prior. The adrenal glands are within normal limits. Stomach/Bowel: A partial colectomy changes are present. Small bowel anastomosis is present in the left abdomen. There is no evidence for bowel obstruction. The appendix is within normal limits. In stomach is within normal limits. Vascular/Lymphatic: Aortic atherosclerosis. No enlarged abdominal or pelvic lymph nodes. Reproductive: Prostate gland is enlarged with nodular protrusion into the bladder base. This is similar to prior. Other: There is bulging of the anterior abdominal wall with rectus diastasis containing nondilated small bowel loops. There also smaller supraumbilical fat containing ventral hernias. There is no ascites. Musculoskeletal: L5-S1 posterior fusion hardware is present. IMPRESSION: 1. No acute localizing process in the abdomen or pelvis. 2. Stable hypodense areas in the liver and spleen, possibly cysts. 3. Stable left renal cysts. Stable mildly  hyperdense area in the inferior pole of the left kidney, possibly a hemorrhagic cyst. This can be further evaluated with renal ultrasound. 4. Stable rectus diastasis containing nondilated small bowel loops. 5. Stable enlarged prostate gland with nodular protrusion into the bladder base. 6. Aortic atherosclerosis. Electronically Signed   By: Tyron Gallon M.D.   On: 07/27/2023 22:34   DG Chest 2 View Result Date: 07/27/2023 CLINICAL DATA:  Suspected sepsis EXAM: CHEST - 2 VIEW COMPARISON:  08/03/2021 FINDINGS: Heart and mediastinal contours are within normal limits. No focal opacities or effusions. No acute bony abnormality. IMPRESSION: No active cardiopulmonary disease. Electronically Signed   By: Janeece Mechanic M.D.   On: 07/27/2023 20:19    EKG: Independently reviewed.  Sinus rhythm, no acute changes.  Assessment and Plan  AKI Likely prerenal in etiology due to severe dehydration.  Blood pressure has now improved after IV fluids.  BUN 57, creatinine 7.4, GFR 7 (baseline creatinine 1.0).  CT abdomen pelvis showing no evidence of urinary tract calculi or hydronephrosis.  Showing possible hemorrhagic cyst in the inferior pole of the left kidney but patient is not having gross or microscopic hematuria at this time.  Nephrology recommended continuing IV fluid hydration and requested hospitalist service to formally consult their service if renal function does not improve on repeat labs. -Continue IV fluid hydration -Hold home benazepril and hydrochlorothiazide.  Hold home ibuprofen/no NSAIDs. Avoid nephrotoxic agents. -Continue to monitor renal function very closely  Diarrhea C. difficile antigen and toxin negative.  CT without evidence of colitis.  GI pathogen panel pending, follow enteric precautions.  Continue Imodium as needed.  UTI Patient is endorsing dysuria.  UA with positive nitrite and microscopy showing many bacteria.  No fever, tachycardia, or leukocytosis to suggest sepsis.  Start  ceftriaxone and follow-up urine culture.  Hypotension Mild lactic acidosis Likely due to severe volume depletion/dehydration rather than sepsis.  Hypotension has resolved after IV fluids.  Trend lactate and follow-up blood cultures.  Mild hyponatremia Continue IV fluid hydration monitor  labs.  Mild hypokalemia Replace potassium and monitor labs.  Magnesium is within normal range.  High anion gap metabolic acidosis In setting of diarrhea and AKI.  Continue IV fluid hydration and monitor labs.  Replace bicarb as needed.  Dyspnea Chest x-ray showing no active cardiopulmonary disease.  ACS less likely as troponin negative x 2 and patient is not endorsing chest pain.  PE less likely given no tachycardia, tachypnea, or hypoxia.  Hypothyroidism Continue Synthroid.  BPH Continue Flomax.  Chronic insomnia Continue home Ambien.  DVT prophylaxis: SCDs Code Status: Full Code (discussed with the patient) Family Communication: No family available at this time. Level of care: Telemetry bed  Juliette Oh MD [facility]  If 7PM-7AM, please contact night-coverage [email or web address]  07/28/2023, 3:03 AM

## 2023-07-28 NOTE — Plan of Care (Signed)

## 2023-07-28 NOTE — Care Management Obs Status (Signed)
 MEDICARE OBSERVATION STATUS NOTIFICATION   Patient Details  Name: Henry [name] MRN: [id number] Date of Birth: Feb 17, 1946   Medicare Observation Status Notification Given:  Yes    Jannine Meo, RN 07/28/2023, 3:03 PM

## 2023-07-28 NOTE — Plan of Care (Signed)
  Problem: Education: Goal: Knowledge of General Education information will improve Description: Including pain rating scale, medication(s)/side effects and non-pharmacologic comfort measures Outcome: Progressing   Problem: Health Behavior/Discharge Planning: Goal: Ability to manage health-related needs will improve Outcome: Progressing   Problem: Clinical Measurements: Goal: Ability to maintain clinical measurements within normal limits will improve Outcome: Progressing Goal: Will remain free from infection Outcome: Progressing Goal: Diagnostic test results will improve Outcome: Progressing   Problem: Activity: Goal: Risk for activity intolerance will decrease Outcome: Progressing   Problem: Nutrition: Goal: Adequate nutrition will be maintained Outcome: Progressing   Problem: Coping: Goal: Level of anxiety will decrease Outcome: Progressing   Problem: Elimination: Goal: Will not experience complications related to bowel motility Outcome: Progressing Goal: Will not experience complications related to urinary retention Outcome: Progressing   Problem: Pain Managment: Goal: General experience of comfort will improve and/or be controlled Outcome: Progressing

## 2023-07-28 NOTE — Progress Notes (Signed)
 Pt refusing telemetry; MD notified and order to discontinue telemetry received. CCMD notified. P. Amo Athol Bolds RN

## 2023-07-29 DIAGNOSIS — N179 Acute kidney failure, unspecified: Secondary | ICD-10-CM | POA: Diagnosis not present

## 2023-07-29 LAB — COMPREHENSIVE METABOLIC PANEL WITH GFR
ALT: 11 U/L (ref 0–44)
AST: 13 U/L — ABNORMAL LOW (ref 15–41)
Albumin: 3 g/dL — ABNORMAL LOW (ref 3.5–5.0)
Alkaline Phosphatase: 44 U/L (ref 38–126)
Anion gap: 8 (ref 5–15)
BUN: 26 mg/dL — ABNORMAL HIGH (ref 8–23)
CO2: 16 mmol/L — ABNORMAL LOW (ref 22–32)
Calcium: 8.6 mg/dL — ABNORMAL LOW (ref 8.9–10.3)
Chloride: 112 mmol/L — ABNORMAL HIGH (ref 98–111)
Creatinine, Ser: 1.59 mg/dL — ABNORMAL HIGH (ref 0.61–1.24)
GFR, Estimated: 45 mL/min — ABNORMAL LOW (ref >60)
Glucose, Bld: 112 mg/dL — ABNORMAL HIGH (ref 70–99)
Potassium: 3.5 mmol/L (ref 3.5–5.1)
Sodium: 136 mmol/L (ref 135–145)
Total Bilirubin: 0.9 mg/dL (ref 0.0–1.2)
Total Protein: 6.1 g/dL — ABNORMAL LOW (ref 6.5–8.1)

## 2023-07-29 LAB — URINE CULTURE: Culture: <10000 — AB

## 2023-07-29 LAB — CBC WITH DIFFERENTIAL/PLATELET
Abs Immature Granulocytes: 0.03 10*3/uL (ref 0.00–0.07)
Basophils Absolute: 0 10*3/uL (ref 0.0–0.1)
Basophils Relative: 1 %
Eosinophils Absolute: 0.1 10*3/uL (ref 0.0–0.5)
Eosinophils Relative: 1 %
HCT: 41.6 % (ref 39.0–52.0)
Hemoglobin: 14.1 g/dL (ref 13.0–17.0)
Immature Granulocytes: 1 %
Lymphocytes Relative: 24 %
Lymphs Abs: 1.2 10*3/uL (ref 0.7–4.0)
MCH: 27.5 pg (ref 26.0–34.0)
MCHC: 33.9 g/dL (ref 30.0–36.0)
MCV: 81.3 fL (ref 80.0–100.0)
Monocytes Absolute: 0.9 10*3/uL (ref 0.1–1.0)
Monocytes Relative: 17 %
Neutro Abs: 2.8 10*3/uL (ref 1.7–7.7)
Neutrophils Relative %: 56 %
Platelets: 256 10*3/uL (ref 150–400)
RBC: 5.12 MIL/uL (ref 4.22–5.81)
RDW: 14.1 % (ref 11.5–15.5)
WBC: 5 10*3/uL (ref 4.0–10.5)
nRBC: 0 % (ref 0.0–0.2)

## 2023-07-29 LAB — MAGNESIUM: Magnesium: 2 mg/dL (ref 1.7–2.4)

## 2023-07-29 MED ORDER — TAMSULOSIN HCL 0.4 MG PO CAPS
0.8000 mg | ORAL_CAPSULE | Freq: Every day | ORAL | Status: DC
  Administered 2023-07-29 – 2023-07-30 (×2): 0.8 mg via ORAL
  Filled 2023-07-29 (×2): qty 2

## 2023-07-29 MED ORDER — TRAMADOL HCL 50 MG PO TABS
50.0000 mg | ORAL_TABLET | Freq: Two times a day (BID) | ORAL | Status: DC | PRN
  Administered 2023-07-29 – 2023-07-30 (×3): 50 mg via ORAL
  Filled 2023-07-29 (×3): qty 1

## 2023-07-29 NOTE — Progress Notes (Signed)
 PROGRESS NOTE    Henry [name]  ZOX:[id number] DOB: [birth year] DOA: 07/27/2023 PCP: Elvira Hammersmith, MD    Brief Narrative:  78 year old with history of hypertension, hypothyroidism and chronic pain syndrome and anxiety depression, history of cholecystectomy and previous bile leak and colon perforation, multiple abdominal wall hernias presented to the emergency room with loose watery stool 15 times a day, abdominal cramping and shortness of breath. Blood pressure 70/55 on arrival. Potassium 3.2. Creatinine 7.4 with recent baseline creatinine normal. Admitted with AKI, severe dehydration, diarrhea. Wife also had transient diarrhea. Urinalysis was abnormal, however patient urinating very low with diarrhea.   Subjective: Patient seen and examined.  He had some urinary spasms today but able to urinate.  Uses Flomax 0.8 mg daily.  Denies any nausea vomiting.  He was very excited not to go to the bathroom all night.  He only had 2 loose bowel movements since last 12 hours.  Feels overall better.  Very grateful.  Able to walk around without orthostatics. 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  Can use Imodium.  Maintenance fluid.  Already improving.  No indication for antibiotics.  Acute renal failure with ATN, hypokalemia: Presented with creatinine of 7.4 with recent normal creatinine.  Abdominal scan without evidence of hydronephrosis or outlet obstruction.  Excellent clinical response on maintenance fluid.  Urine output is adequate.  Creatinine 1.59 today.  Continue fluid today.  Encourage oral intake.  High risk of decompensation without IV fluids. Holding diuretics and lisinopril. Potassium is replaced and adequate.  Hypertension: As above.  Holding antihypertensives.  Chronic pain syndrome, anxiety and depression: Patient on tramadol, Ativan that he will continue.  Suspected UTI: Urine culture with less than 10,000  colonies.  Discontinue antibiotics.   DVT prophylaxis: SCDs Start: 07/28/23 0429   Code Status: Full code Family Communication: None at the bedside Disposition Plan: Status is: Inpatient Remains inpatient appropriate because: IV fluids     Consultants:  None  Procedures:  None  Antimicrobials:  Rocephin4/12-4/13     Objective: Vitals:   07/28/23 2304 07/29/23 0012 07/29/23 0515 07/29/23 0731  BP:  102/81 124/73 123/67  Pulse:  71 70 73  Resp:  18 18 16   Temp: 99 F (37.2 C) 98.3 F (36.8 C) 97.6 F (36.4 C) 98 F (36.7 C)  TempSrc:  Oral Oral Oral  SpO2:  97% 100% 97%  Weight:      Height:        Intake/Output Summary (Last 24 hours) at 07/29/2023 1300 Last data filed at 07/29/2023 1100 Gross per 24 hour  Intake 1913.86 ml  Output 725 ml  Net 1188.86 ml   Filed Weights   07/27/23 1810  Weight: 81.2 kg    Examination:  General exam: Appears calm and comfortable  Respiratory system: Clear to auscultation. Respiratory effort normal. Cardiovascular system: S1 & S2 heard, RRR. No JVD, murmurs, rubs, gallops or clicks. No pedal edema. Gastrointestinal system: Soft.  Nontender.  Bowel sound present.  Multiple abdominal wall incisions and hernias uncomplicated. Central nervous system: Alert and oriented. No focal neurological deficits. Extremities: Symmetric 5 x 5 power. Skin: No rashes, lesions or ulcers Psychiatry: Judgement and insight appear normal. Mood & affect appropriate.     Data Reviewed: I have personally reviewed following labs and imaging studies  CBC: Recent Labs  Lab 07/27/23 1905 07/29/23 0839  WBC 6.6 5.0  NEUTROABS 4.1 2.8  HGB 16.7 14.1  HCT 50.2 41.6  MCV 82.6 81.3  PLT 255 256   Basic Metabolic Panel: Recent Labs  Lab 07/27/23 1905 07/27/23 1913 07/28/23 0735 07/29/23 0839  NA 132*  --  134* 136  K 3.2*  --  3.2* 3.5  CL 98  --  107 112*  CO2 17*  --  13* 16*  GLUCOSE 117*  --  82 112*  BUN 57*  --  51* 26*   CREATININE 7.45*  --  4.47* 1.59*  CALCIUM 9.3  --  8.0* 8.6*  MG  --  2.3  --  2.0   GFR: Estimated Creatinine Clearance: 38 mL/min (A) (by C-G formula based on SCr of 1.59 mg/dL (H)). Liver Function Tests: Recent Labs  Lab 07/27/23 1905 07/29/23 0839  AST 14* 13*  ALT 11 11  ALKPHOS 52 44  BILITOT 1.2 0.9  PROT 7.1 6.1*  ALBUMIN 3.6 3.0*   Recent Labs  Lab 07/27/23 1913  LIPASE 54*   No results for input(s): "AMMONIA" in the last 168 hours. Coagulation Profile: Recent Labs  Lab 07/27/23 1905  INR 1.1   Cardiac Enzymes: No results for input(s): "CKTOTAL", "CKMB", "CKMBINDEX", "TROPONINI" in the last 168 hours. BNP (last 3 results) No results for input(s): "PROBNP" in the last 8760 hours. HbA1C: No results for input(s): "HGBA1C" in the last 72 hours. CBG: No results for input(s): "GLUCAP" in the last 168 hours. Lipid Profile: No results for input(s): "CHOL", "HDL", "LDLCALC", "TRIG", "CHOLHDL", "LDLDIRECT" in the last 72 hours. Thyroid Function Tests: No results for input(s): "TSH", "T4TOTAL", "FREET4", "T3FREE", "THYROIDAB" in the last 72 hours. Anemia Panel: No results for input(s): "VITAMINB12", "FOLATE", "FERRITIN", "TIBC", "IRON", "RETICCTPCT" in the last 72 hours. Sepsis Labs: Recent Labs  Lab 07/27/23 1915 07/27/23 2104 07/28/23 0735  LATICACIDVEN 2.1* 2.2* 1.0    Recent Results (from the past 240 hours)  Culture, blood (Routine x 2)     Status: None (Preliminary result)   Collection Time: 07/27/23  6:15 PM   Specimen: BLOOD  Result Value Ref Range Status   Specimen Description BLOOD SITE NOT SPECIFIED  Final   Special Requests   Final    AEROBIC BOTTLE ONLY Blood Culture results may not be optimal due to an inadequate volume of blood received in culture bottles   Culture   Final    NO GROWTH 2 DAYS Performed at [facility], [address], [place], [place] [address]    Report Status PENDING  Incomplete  Culture, blood (Routine x 2)      Status: None (Preliminary result)   Collection Time: 07/27/23  6:20 PM   Specimen: BLOOD  Result Value Ref Range Status   Specimen Description BLOOD BLOOD RIGHT FOREARM  Final   Special Requests   Final    BOTTLES DRAWN AEROBIC AND ANAEROBIC Blood Culture results may not be optimal due to an inadequate volume of blood received in culture bottles   Culture   Final    NO GROWTH 2 DAYS Performed at [facility], [address] [place], [place] [address]    Report Status PENDING  Incomplete  C Difficile Quick Screen w PCR reflex     Status: None   Collection Time: 07/27/23  8:45 PM   Specimen: STOOL  Result Value Ref Range Status   C Diff antigen NEGATIVE NEGATIVE Final   C Diff toxin NEGATIVE NEGATIVE Final   C Diff interpretation No C. difficile detected.  Final    Comment: Performed at  [facility], [address] [place], [place] [address]  Gastrointestinal Panel by PCR , Stool     Status: Abnormal   Collection Time: 07/27/23  8:45 PM   Specimen: STOOL  Result Value Ref Range Status   Campylobacter species NOT DETECTED NOT DETECTED Final   Plesimonas shigelloides NOT DETECTED NOT DETECTED Final   Salmonella species DETECTED (A) NOT DETECTED Final    Comment: RESULT CALLED TO, READ BACK BY AND VERIFIED WITH: AYANA ROSS/NURSE @ 1346 07/28/23 BY SKY    Yersinia enterocolitica NOT DETECTED NOT DETECTED Final   Vibrio species NOT DETECTED NOT DETECTED Final   Vibrio cholerae NOT DETECTED NOT DETECTED Final   Enteroaggregative E coli (EAEC) NOT DETECTED NOT DETECTED Final   Enteropathogenic E coli (EPEC) NOT DETECTED NOT DETECTED Final   Enterotoxigenic E coli (ETEC) NOT DETECTED NOT DETECTED Final   Shiga like toxin producing E coli (STEC) NOT DETECTED NOT DETECTED Final   Shigella/Enteroinvasive E coli (EIEC) NOT DETECTED NOT DETECTED Final   Cryptosporidium NOT DETECTED NOT DETECTED Final   Cyclospora cayetanensis NOT DETECTED NOT DETECTED Final   Entamoeba  histolytica NOT DETECTED NOT DETECTED Final   Giardia lamblia NOT DETECTED NOT DETECTED Final   Adenovirus F40/41 NOT DETECTED NOT DETECTED Final   Astrovirus NOT DETECTED NOT DETECTED Final   Norovirus GI/GII NOT DETECTED NOT DETECTED Final   Rotavirus A NOT DETECTED NOT DETECTED Final   Sapovirus (I, II, IV, and V) NOT DETECTED NOT DETECTED Final    Comment: Performed at [facility], [address] [place], [place] [address]  Urine Culture (for pregnant, neutropenic or urologic patients or patients with an indwelling urinary catheter)     Status: Abnormal   Collection Time: 07/28/23  4:31 AM   Specimen: Urine, Clean Catch  Result Value Ref Range Status   Specimen Description URINE, CLEAN CATCH  Final   Special Requests NONE  Final   Culture (A)  Final    <10,000 COLONIES/mL INSIGNIFICANT GROWTH Performed at [facility], [address] [place], [place] [address]    Report Status 07/29/2023 FINAL  Final         Radiology Studies: CT ABDOMEN PELVIS WO CONTRAST Result Date: 07/27/2023 CLINICAL DATA:  Diarrhea and abdominal pain EXAM: CT ABDOMEN AND PELVIS WITHOUT CONTRAST TECHNIQUE: Multidetector CT imaging of the abdomen and pelvis was performed following the standard protocol without IV contrast. RADIATION DOSE REDUCTION: This exam was performed according to the departmental dose-optimization program which includes automated exposure control, adjustment of the mA and/or kV according to patient size and/or use of iterative reconstruction technique. COMPARISON:  CT abdomen and pelvis 08/03/2021 FINDINGS: Lower chest: There is atelectasis in the lung bases. Hepatobiliary: There are 3 rounded hypodense areas in the liver which are unchanged from the prior examination, possibly cysts or hemangiomas. These measure up to 16 mm. Gallbladder surgically absent. There is no biliary ductal dilatation. Pancreas: Unremarkable. No pancreatic ductal dilatation or surrounding  inflammatory changes. Spleen: Normal in size. Rounded hypodensity in the inferior spleen is unchanged from prior measuring 15 mm, possibly a cyst. Adrenals/Urinary Tract: The bladder is decompressed. No urinary tract calculi or hydronephrosis. Left renal cysts are unchanged from prior measuring up to 2 cm. Within the inferior pole the left kidney there is a 3.7 x 3.3 cm mildly hyperdense area which appears similar to prior. The adrenal glands are within normal limits. Stomach/Bowel: A partial colectomy changes are present. Small bowel anastomosis is  present in the left abdomen. There is no evidence for bowel obstruction. The appendix is within normal limits. In stomach is within normal limits. Vascular/Lymphatic: Aortic atherosclerosis. No enlarged abdominal or pelvic lymph nodes. Reproductive: Prostate gland is enlarged with nodular protrusion into the bladder base. This is similar to prior. Other: There is bulging of the anterior abdominal wall with rectus diastasis containing nondilated small bowel loops. There also smaller supraumbilical fat containing ventral hernias. There is no ascites. Musculoskeletal: L5-S1 posterior fusion hardware is present. IMPRESSION: 1. No acute localizing process in the abdomen or pelvis. 2. Stable hypodense areas in the liver and spleen, possibly cysts. 3. Stable left renal cysts. Stable mildly hyperdense area in the inferior pole of the left kidney, possibly a hemorrhagic cyst. This can be further evaluated with renal ultrasound. 4. Stable rectus diastasis containing nondilated small bowel loops. 5. Stable enlarged prostate gland with nodular protrusion into the bladder base. 6. Aortic atherosclerosis. Electronically Signed   By: Tyron Gallon M.D.   On: 07/27/2023 22:34   DG Chest 2 View Result Date: 07/27/2023 CLINICAL DATA:  Suspected sepsis EXAM: CHEST - 2 VIEW COMPARISON:  08/03/2021 FINDINGS: Heart and mediastinal contours are within normal limits. No focal opacities or  effusions. No acute bony abnormality. IMPRESSION: No active cardiopulmonary disease. Electronically Signed   By: Janeece Mechanic M.D.   On: 07/27/2023 20:19        Scheduled Meds:  levothyroxine  125 mcg Oral QAC breakfast   potassium chloride  40 mEq Oral BID   tamsulosin  0.8 mg Oral QPC breakfast   Continuous Infusions:  sodium chloride 150 mL/hr at 07/28/23 2131     LOS: 1 day    Time spent: 52 minutes    Vada Garibaldi, MD [facility]

## 2023-07-29 NOTE — Plan of Care (Signed)
  Problem: Clinical Measurements: Goal: Diagnostic test results will improve Outcome: Progressing   Problem: Activity: Goal: Risk for activity intolerance will decrease Outcome: Progressing   Problem: Nutrition: Goal: Adequate nutrition will be maintained Outcome: Progressing   Problem: Coping: Goal: Level of anxiety will decrease Outcome: Progressing   Problem: Elimination: Goal: Will not experience complications related to bowel motility Outcome: Progressing Goal: Will not experience complications related to urinary retention Outcome: Progressing

## 2023-07-30 DIAGNOSIS — N179 Acute kidney failure, unspecified: Secondary | ICD-10-CM | POA: Diagnosis not present

## 2023-07-30 LAB — BASIC METABOLIC PANEL WITH GFR
Anion gap: 4 — ABNORMAL LOW (ref 5–15)
BUN: 19 mg/dL (ref 8–23)
CO2: 18 mmol/L — ABNORMAL LOW (ref 22–32)
Calcium: 8.5 mg/dL — ABNORMAL LOW (ref 8.9–10.3)
Chloride: 115 mmol/L — ABNORMAL HIGH (ref 98–111)
Creatinine, Ser: 1.29 mg/dL — ABNORMAL HIGH (ref 0.61–1.24)
GFR, Estimated: 57 mL/min — ABNORMAL LOW (ref >60)
Glucose, Bld: 80 mg/dL (ref 70–99)
Potassium: 4.4 mmol/L (ref 3.5–5.1)
Sodium: 137 mmol/L (ref 135–145)

## 2023-07-30 NOTE — Progress Notes (Signed)
 After loss of IV access, patient requests that IV not be restarted and states that he is going home tomorrow. Notified J.Sharion Davidson NP regarding loss of IV access and refusal to restart.

## 2023-07-30 NOTE — Progress Notes (Signed)
 Explained discharge instructions to patient. Reviewed follow up appointment and next medication administration times. Also reviewed education. Patient verbalized having an understanding for instructions given. All belongings are in the patient's possession. No other needs verbalized. Will transport downstairs to the discharge lounge to await his ride.

## 2023-07-30 NOTE — Discharge Summary (Signed)
 Physician Discharge Summary  CAILEB RHUE ZOX:[id number] DOB: Aug 18, 1945 DOA: 07/27/2023  PCP: Georgina Quint, MD  Admit date: 07/27/2023 Discharge date: 07/30/2023  Admitted From: Home Disposition: Home  Recommendations for Outpatient Follow-up:  Follow up with PCP in 1-2 weeks Please obtain BMP/CBC in one week   Home Health: N/A Equipment/Devices: N/A  Discharge Condition: Stable CODE STATUS: Full code Diet recommendation: Low-salt diet, plenty of hydration  Discharge summary: 78 year old with history of hypertension, hypothyroidism and chronic pain syndrome and anxiety depression, history of cholecystectomy and previous bile leak and colon perforation, multiple abdominal wall hernias presented to the emergency room with loose watery stool 15 times a day, abdominal cramping and shortness of breath. Blood pressure 70/55 on arrival. Potassium 3.2. Creatinine 7.4 with recent baseline creatinine normal. Admitted with AKI, severe dehydration, diarrhea. Wife also had transient diarrhea. Urinalysis was abnormal, however patient urinating very low with diarrhea.  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  Treated with IV fluids, oral hydration with almost complete resolution of symptoms.  Renal functions normalized.  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  Can use Imodium.  Treated with maintenance IV fluids.  Already improving.  No indication for antibiotics.    Acute renal failure with ATN, hypokalemia: Presented with creatinine of 7.4 with recent normal creatinine.  Abdominal scan without evidence of hydronephrosis or outlet obstruction.  Excellent clinical response on fluid resuscitation.  Urine output is adequate.  Creatinine 1.3 today.  Encourage oral intake.  Patient was also on benazepril hydrochlorothiazide at home.   Potassium is replaced and adequate.   Hypertension: As above.  Blood pressures not picking up.  With clinical  improvement, encouraged to resume benazepril hydrochlorothiazide on 4/17.  Will need repeat renal function test in about 1 week.   Chronic pain syndrome, anxiety and depression: Patient on tramadol, Ativan that he will continue.   Suspected UTI: Urine culture with less than 10,000 colonies.  No indication for antibiotics.  His urinary symptoms improved after resuming his Flomax.   Stable for discharge.  Discharge Diagnoses:  Principal Problem:   AKI (acute kidney injury) (HCC) Active Problems:   Diarrhea   Hypothyroidism   UTI (urinary tract infection)   Hypotension   Hyponatremia   Hypokalemia   Intractable diarrhea    Discharge Instructions  Discharge Instructions     Diet - low sodium heart healthy   Complete by: As directed    Increase activity slowly   Complete by: As directed       Allergies as of 07/30/2023       Reactions   Ciprofloxacin Itching, Rash        Medication List     PAUSE taking these medications    benazepril-hydrochlorthiazide 20-25 MG tablet Wait to take this until: August 02, 2023 Commonly known as: LOTENSIN HCT TAKE 1 TABLET BY MOUTH EVERY DAY       STOP taking these medications    bismuth subsalicylate 262 MG/15ML suspension Commonly known as: PEPTO BISMOL   DULoxetine 30 MG capsule Commonly known as: Cymbalta   lidocaine 5 % Commonly known as: Lidoderm       TAKE these medications    acetaminophen 650 MG CR tablet Commonly known as: TYLENOL Take 325-600 mg by mouth every 8 (eight) hours as needed for pain.   cholecalciferol 1000 units tablet Commonly known as: VITAMIN D Take 1,000 Units by mouth daily.   fish oil-omega-3 fatty acids 1000 MG capsule Take 2 g  by mouth daily.   ibuprofen 200 MG tablet Commonly known as: ADVIL Take 200 mg by mouth every 6 (six) hours as needed for moderate pain (pain score 4-6), fever or mild pain (pain score 1-3).   IMODIUM PO Take 1-2 tablets by mouth as needed.    levothyroxine 125 MCG tablet Commonly known as: SYNTHROID TAKE 1 TABLET BY MOUTH EVERY DAY BEFORE BREAKFAST What changed: See the new instructions.   LORazepam 1 MG tablet Commonly known as: ATIVAN TAKE 1 TABLET BY MOUTH EVERY DAY AS NEEDED FOR ANXIETY What changed: See the new instructions.   tamsulosin 0.4 MG Caps capsule Commonly known as: FLOMAX Take 0.4 mg by mouth daily after breakfast.   zolpidem 10 MG tablet Commonly known as: AMBIEN Take 1 tablet (10 mg total) by mouth at bedtime as needed for sleep. What changed: when to take this        Allergies  Allergen Reactions   Ciprofloxacin Itching and Rash    Consultations: None   Procedures/Studies: CT ABDOMEN PELVIS WO CONTRAST Result Date: 07/27/2023 CLINICAL DATA:  Diarrhea and abdominal pain EXAM: CT ABDOMEN AND PELVIS WITHOUT CONTRAST TECHNIQUE: Multidetector CT imaging of the abdomen and pelvis was performed following the standard protocol without IV contrast. RADIATION DOSE REDUCTION: This exam was performed according to the departmental dose-optimization program which includes automated exposure control, adjustment of the mA and/or kV according to patient size and/or use of iterative reconstruction technique. COMPARISON:  CT abdomen and pelvis 08/03/2021 FINDINGS: Lower chest: There is atelectasis in the lung bases. Hepatobiliary: There are 3 rounded hypodense areas in the liver which are unchanged from the prior examination, possibly cysts or hemangiomas. These measure up to 16 mm. Gallbladder surgically absent. There is no biliary ductal dilatation. Pancreas: Unremarkable. No pancreatic ductal dilatation or surrounding inflammatory changes. Spleen: Normal in size. Rounded hypodensity in the inferior spleen is unchanged from prior measuring 15 mm, possibly a cyst. Adrenals/Urinary Tract: The bladder is decompressed. No urinary tract calculi or hydronephrosis. Left renal cysts are unchanged from prior measuring up to 2  cm. Within the inferior pole the left kidney there is a 3.7 x 3.3 cm mildly hyperdense area which appears similar to prior. The adrenal glands are within normal limits. Stomach/Bowel: A partial colectomy changes are present. Small bowel anastomosis is present in the left abdomen. There is no evidence for bowel obstruction. The appendix is within normal limits. In stomach is within normal limits. Vascular/Lymphatic: Aortic atherosclerosis. No enlarged abdominal or pelvic lymph nodes. Reproductive: Prostate gland is enlarged with nodular protrusion into the bladder base. This is similar to prior. Other: There is bulging of the anterior abdominal wall with rectus diastasis containing nondilated small bowel loops. There also smaller supraumbilical fat containing ventral hernias. There is no ascites. Musculoskeletal: L5-S1 posterior fusion hardware is present. IMPRESSION: 1. No acute localizing process in the abdomen or pelvis. 2. Stable hypodense areas in the liver and spleen, possibly cysts. 3. Stable left renal cysts. Stable mildly hyperdense area in the inferior pole of the left kidney, possibly a hemorrhagic cyst. This can be further evaluated with renal ultrasound. 4. Stable rectus diastasis containing nondilated small bowel loops. 5. Stable enlarged prostate gland with nodular protrusion into the bladder base. 6. Aortic atherosclerosis. Electronically Signed   By: Tyron Gallon M.D.   On: 07/27/2023 22:34   DG Chest 2 View Result Date: 07/27/2023 CLINICAL DATA:  Suspected sepsis EXAM: CHEST - 2 VIEW COMPARISON:  08/03/2021 FINDINGS: Heart and mediastinal contours  are within normal limits. No focal opacities or effusions. No acute bony abnormality. IMPRESSION: No active cardiopulmonary disease. Electronically Signed   By: Charlett Nose M.D.   On: 07/27/2023 20:19   (Echo, Carotid, EGD, Colonoscopy, ERCP)    Subjective: Patient seen and examined.  He is pacing in the hallway waiting for me to around so he can  be discharged.  He went only once to the bathroom last night and it was semisolid.  Denies any nausea vomiting.  He has some abdominal discomfort but that is mostly because of his hernias.  Eager to go home.  Voiding without difficulties.   Discharge Exam: Vitals:   07/30/23 0521 07/30/23 0824  BP: (!) 122/91 137/80  Pulse: 75 64  Resp: 18 18  Temp: 97.7 F (36.5 C) (!) 97.4 F (36.3 C)  SpO2: 97% 98%   Vitals:   07/29/23 1550 07/29/23 2018 07/30/23 0521 07/30/23 0824  BP: 112/75 126/72 (!) 122/91 137/80  Pulse: 71 69 75 64  Resp: 18 18 18 18   Temp: (!) 97.5 F (36.4 C) 97.9 F (36.6 C) 97.7 F (36.5 C) (!) 97.4 F (36.3 C)  TempSrc: Oral Oral Oral Oral  SpO2: 98% 98% 97% 98%  Weight:      Height:        General: Pt is alert, awake, not in acute distress Walking in the hallway. Cardiovascular: RRR, S1/S2 +, no rubs, no gallops Respiratory: CTA bilaterally, no wheezing, no rhonchi Abdominal: Soft, NT, ND, bowel sounds +, multiple abdominal wall hernias reducible and nontender. Extremities: no edema, no cyanosis    The results of significant diagnostics from this hospitalization (including imaging, microbiology, ancillary and laboratory) are listed below for reference.     Microbiology: Recent Results (from the past 240 hours)  Culture, blood (Routine x 2)     Status: None (Preliminary result)   Collection Time: 07/27/23  6:15 PM   Specimen: BLOOD  Result Value Ref Range Status   Specimen Description BLOOD SITE NOT SPECIFIED  Final   Special Requests   Final    AEROBIC BOTTLE ONLY Blood Culture results may not be optimal due to an inadequate volume of blood received in culture bottles   Culture   Final    NO GROWTH 3 DAYS Performed at [facility], [address], [place], [place] [address]    Report Status PENDING  Incomplete  Culture, blood (Routine x 2)     Status: None (Preliminary result)   Collection Time: 07/27/23  6:20 PM   Specimen: BLOOD   Result Value Ref Range Status   Specimen Description BLOOD BLOOD RIGHT FOREARM  Final   Special Requests   Final    BOTTLES DRAWN AEROBIC AND ANAEROBIC Blood Culture results may not be optimal due to an inadequate volume of blood received in culture bottles   Culture   Final    NO GROWTH 3 DAYS Performed at [facility], [address], [place], [place] [address]    Report Status PENDING  Incomplete  C Difficile Quick Screen w PCR reflex     Status: None   Collection Time: 07/27/23  8:45 PM   Specimen: STOOL  Result Value Ref Range Status   C Diff antigen NEGATIVE NEGATIVE Final   C Diff toxin NEGATIVE NEGATIVE Final   C Diff interpretation No C. difficile detected.  Final    Comment: Performed at [facility], [address] [place], [place] [address]  Gastrointestinal Panel by PCR , Stool     Status: Abnormal   Collection Time: 07/27/23  8:45 PM   Specimen: STOOL  Result Value Ref Range Status   Campylobacter species NOT DETECTED NOT DETECTED Final   Plesimonas shigelloides NOT DETECTED NOT DETECTED Final   Salmonella species DETECTED (A) NOT DETECTED Final    Comment: RESULT CALLED TO, READ BACK BY AND VERIFIED WITH: AYANA ROSS/NURSE @ 1346 07/28/23 BY SKY    Yersinia enterocolitica NOT DETECTED NOT DETECTED Final   Vibrio species NOT DETECTED NOT DETECTED Final   Vibrio cholerae NOT DETECTED NOT DETECTED Final   Enteroaggregative E coli (EAEC) NOT DETECTED NOT DETECTED Final   Enteropathogenic E coli (EPEC) NOT DETECTED NOT DETECTED Final   Enterotoxigenic E coli (ETEC) NOT DETECTED NOT DETECTED Final   Shiga like toxin producing E coli (STEC) NOT DETECTED NOT DETECTED Final   Shigella/Enteroinvasive E coli (EIEC) NOT DETECTED NOT DETECTED Final   Cryptosporidium NOT DETECTED NOT DETECTED Final   Cyclospora cayetanensis NOT DETECTED NOT DETECTED Final   Entamoeba histolytica NOT DETECTED NOT DETECTED Final   Giardia lamblia NOT DETECTED NOT DETECTED Final    Adenovirus F40/41 NOT DETECTED NOT DETECTED Final   Astrovirus NOT DETECTED NOT DETECTED Final   Norovirus GI/GII NOT DETECTED NOT DETECTED Final   Rotavirus A NOT DETECTED NOT DETECTED Final   Sapovirus (I, II, IV, and V) NOT DETECTED NOT DETECTED Final    Comment: Performed at [facility], [address] [place], [place] [address]  Urine Culture (for pregnant, neutropenic or urologic patients or patients with an indwelling urinary catheter)     Status: Abnormal   Collection Time: 07/28/23  4:31 AM   Specimen: Urine, Clean Catch  Result Value Ref Range Status   Specimen Description URINE, CLEAN CATCH  Final   Special Requests NONE  Final   Culture (A)  Final    <10,000 COLONIES/mL INSIGNIFICANT GROWTH Performed at [facility], [address], [place], [place] [address]    Report Status 07/29/2023 FINAL  Final     Labs: BNP (last 3 results) No results for input(s): "BNP" in the last 8760 hours. Basic Metabolic Panel: Recent Labs  Lab 07/27/23 1905 07/27/23 1913 07/28/23 0735 07/29/23 0839 07/30/23 0249  NA 132*  --  134* 136 137  K 3.2*  --  3.2* 3.5 4.4  CL 98  --  107 112* 115*  CO2 17*  --  13* 16* 18*  GLUCOSE 117*  --  82 112* 80  BUN 57*  --  51* 26* 19  CREATININE 7.45*  --  4.47* 1.59* 1.29*  CALCIUM 9.3  --  8.0* 8.6* 8.5*  MG  --  2.3  --  2.0  --    Liver Function Tests: Recent Labs  Lab 07/27/23 1905 07/29/23 0839  AST 14* 13*  ALT 11 11  ALKPHOS 52 44  BILITOT 1.2 0.9  PROT 7.1 6.1*  ALBUMIN 3.6 3.0*   Recent Labs  Lab 07/27/23 1913  LIPASE 54*   No results for input(s): "AMMONIA" in the last 168 hours. CBC: Recent Labs  Lab 07/27/23 1905 07/29/23 0839  WBC 6.6 5.0  NEUTROABS 4.1 2.8  HGB 16.7 14.1  HCT 50.2 41.6  MCV 82.6 81.3  PLT 255 256   Cardiac Enzymes: No results for input(s): "CKTOTAL", "CKMB", "CKMBINDEX", "TROPONINI" in the last 168 hours. BNP: Invalid input(s): "POCBNP" CBG: No results for input(s):  "GLUCAP" in the  last 168 hours. D-Dimer No results for input(s): "DDIMER" in the last 72 hours. Hgb A1c No results for input(s): "HGBA1C" in the last 72 hours. Lipid Profile No results for input(s): "CHOL", "HDL", "LDLCALC", "TRIG", "CHOLHDL", "LDLDIRECT" in the last 72 hours. Thyroid function studies No results for input(s): "TSH", "T4TOTAL", "T3FREE", "THYROIDAB" in the last 72 hours.  Invalid input(s): "FREET3" Anemia work up No results for input(s): "VITAMINB12", "FOLATE", "FERRITIN", "TIBC", "IRON", "RETICCTPCT" in the last 72 hours. Urinalysis    Component Value Date/Time   COLORURINE YELLOW 07/27/2023 2045   APPEARANCEUR CLOUDY (A) 07/27/2023 2045   LABSPEC >1.030 (H) 07/27/2023 2045   PHURINE 5.0 07/27/2023 2045   GLUCOSEU 100 (A) 07/27/2023 2045   HGBUR SMALL (A) 07/27/2023 2045   BILIRUBINUR MODERATE (A) 07/27/2023 2045   BILIRUBINUR negative 07/11/2017 1022   BILIRUBINUR neg 03/11/2014 1145   KETONESUR NEGATIVE 07/27/2023 2045   PROTEINUR 100 (A) 07/27/2023 2045   UROBILINOGEN 0.2 07/11/2017 1022   UROBILINOGEN 0.2 02/20/2014 1037   NITRITE POSITIVE (A) 07/27/2023 2045   LEUKOCYTESUR NEGATIVE 07/27/2023 2045   Sepsis Labs Recent Labs  Lab 07/27/23 1905 07/29/23 0839  WBC 6.6 5.0   Microbiology Recent Results (from the past 240 hours)  Culture, blood (Routine x 2)     Status: None (Preliminary result)   Collection Time: 07/27/23  6:15 PM   Specimen: BLOOD  Result Value Ref Range Status   Specimen Description BLOOD SITE NOT SPECIFIED  Final   Special Requests   Final    AEROBIC BOTTLE ONLY Blood Culture results may not be optimal due to an inadequate volume of blood received in culture bottles   Culture   Final    NO GROWTH 3 DAYS Performed at [facility], [address] [place], [place] [address]    Report Status PENDING  Incomplete  Culture, blood (Routine x 2)     Status: None (Preliminary result)   Collection Time: 07/27/23  6:20 PM    Specimen: BLOOD  Result Value Ref Range Status   Specimen Description BLOOD BLOOD RIGHT FOREARM  Final   Special Requests   Final    BOTTLES DRAWN AEROBIC AND ANAEROBIC Blood Culture results may not be optimal due to an inadequate volume of blood received in culture bottles   Culture   Final    NO GROWTH 3 DAYS Performed at [facility], [address], [place], [place] [address]    Report Status PENDING  Incomplete  C Difficile Quick Screen w PCR reflex     Status: None   Collection Time: 07/27/23  8:45 PM   Specimen: STOOL  Result Value Ref Range Status   C Diff antigen NEGATIVE NEGATIVE Final   C Diff toxin NEGATIVE NEGATIVE Final   C Diff interpretation No C. difficile detected.  Final    Comment: Performed at [facility], [address] [place], [place] [address]  Gastrointestinal Panel by PCR , Stool     Status: Abnormal   Collection Time: 07/27/23  8:45 PM   Specimen: STOOL  Result Value Ref Range Status   Campylobacter species NOT DETECTED NOT DETECTED Final   Plesimonas shigelloides NOT DETECTED NOT DETECTED Final   Salmonella species DETECTED (A) NOT DETECTED Final    Comment: RESULT CALLED TO, READ BACK BY AND VERIFIED WITH: AYANA ROSS/NURSE @ 1346 07/28/23 BY SKY    Yersinia enterocolitica NOT DETECTED NOT DETECTED Final   Vibrio species NOT DETECTED NOT DETECTED  Final   Vibrio cholerae NOT DETECTED NOT DETECTED Final   Enteroaggregative E coli (EAEC) NOT DETECTED NOT DETECTED Final   Enteropathogenic E coli (EPEC) NOT DETECTED NOT DETECTED Final   Enterotoxigenic E coli (ETEC) NOT DETECTED NOT DETECTED Final   Shiga like toxin producing E coli (STEC) NOT DETECTED NOT DETECTED Final   Shigella/Enteroinvasive E coli (EIEC) NOT DETECTED NOT DETECTED Final   Cryptosporidium NOT DETECTED NOT DETECTED Final   Cyclospora cayetanensis NOT DETECTED NOT DETECTED Final   Entamoeba histolytica NOT DETECTED NOT DETECTED Final   Giardia lamblia NOT DETECTED NOT  DETECTED Final   Adenovirus F40/41 NOT DETECTED NOT DETECTED Final   Astrovirus NOT DETECTED NOT DETECTED Final   Norovirus GI/GII NOT DETECTED NOT DETECTED Final   Rotavirus A NOT DETECTED NOT DETECTED Final   Sapovirus (I, II, IV, and V) NOT DETECTED NOT DETECTED Final    Comment: Performed at [facility], [address], [place], [place] [address]  Urine Culture (for pregnant, neutropenic or urologic patients or patients with an indwelling urinary catheter)     Status: Abnormal   Collection Time: 07/28/23  4:31 AM   Specimen: Urine, Clean Catch  Result Value Ref Range Status   Specimen Description URINE, CLEAN CATCH  Final   Special Requests NONE  Final   Culture (A)  Final    <10,000 COLONIES/mL INSIGNIFICANT GROWTH Performed at [facility], [address] [place], [place] [address]    Report Status 07/29/2023 FINAL  Final     Time coordinating discharge:  35 minutes  SIGNED:   Vada Garibaldi, MD  [facility] 07/30/2023, 10:24 AM

## 2023-07-31 ENCOUNTER — Telehealth: Payer: Self-pay

## 2023-07-31 NOTE — Transitions of Care (Post Inpatient/ED Visit) (Signed)
 07/31/2023  Name: Henry [name] MRN: [id number] DOB: [birth year]  Today's TOC FU Call Status: Today's TOC FU Call Status:: Successful TOC FU Call Completed Unsuccessful Call (1st Attempt) Date: 07/31/23 [facility] FU Call Complete Date: 07/31/23 Patient's Name and Date of Birth confirmed.  Transition Care Management Follow-up Telephone Call Date of Discharge: 07/30/23 Discharge Facility: [facility]) Type of Discharge: Inpatient Admission Primary Inpatient Discharge Diagnosis:: diarrhea How have you been since you were released from the hospital?: Better Any questions or concerns?: No  Items Reviewed: Did you receive and understand the discharge instructions provided?: Yes Medications obtained,verified, and reconciled?: Yes (Medications Reviewed) Any new allergies since your discharge?: No Dietary orders reviewed?: Yes Do you have support at home?: Yes People in Home [RPT]: spouse  Medications Reviewed Today: Medications Reviewed Today     Reviewed by Karena Addison, LPN (Licensed Practical Nurse) on 07/31/23 at 1014  Med List Status: <None>   Medication Order Taking? Sig Documenting Provider Last Dose Status Informant  acetaminophen (TYLENOL) 650 MG CR tablet [id number] No Take 325-600 mg by mouth every 8 (eight) hours as needed for pain. [provider] 07/27/2023 Active Self, Spouse/Significant Other, Pharmacy Records  benazepril-hydrochlorthiazide (LOTENSIN HCT) 20-25 MG tablet [id number] No TAKE 1 TABLET BY MOUTH EVERY DAY Georgina Quint, MD 07/27/2023 Active Self, Spouse/Significant Other, Pharmacy Records           Med Note [facility], TYELISHA L   Fri Jul 27, 2023  8:33 PM) LF: 07/20/23 for a 90ds  cholecalciferol (VITAMIN D) 1000 UNITS tablet [id number] No Take 1,000 Units by mouth daily. [provider] 07/27/2023 Active Self, Spouse/Significant Other, Pharmacy Records  fish oil-omega-3 fatty acids 1000 MG capsule [id number] No Take 2 g by mouth daily.  [provider] 07/27/2023 Active Self, Spouse/Significant Other, Pharmacy Records  ibuprofen (ADVIL) 200 MG tablet [id number] No Take 200 mg by mouth every 6 (six) hours as needed for moderate pain (pain score 4-6), fever or mild pain (pain score 1-3). [provider] Unknown Active Self, Spouse/Significant Other, Pharmacy Records  levothyroxine (SYNTHROID) 125 MCG tablet [id number] No TAKE 1 TABLET BY MOUTH EVERY DAY BEFORE BREAKFAST  Patient taking differently: Take 125 mcg by mouth daily before breakfast.   Georgina Quint, MD 07/27/2023 Active Self, Spouse/Significant Other, Pharmacy Records           Med Note [facility], TYELISHA L   Fri Jul 27, 2023  8:34 PM) LF: 02/18/23 for a 90ds  Loperamide HCl (IMODIUM PO) [id number] No Take 1-2 tablets by mouth as needed. [provider] Past Week Active Self, Spouse/Significant Other, Pharmacy Records  LORazepam (ATIVAN) 1 MG tablet [id number] No TAKE 1 TABLET BY MOUTH EVERY DAY AS NEEDED FOR ANXIETY  Patient taking differently: Take 1 mg by mouth 3 times/day as needed-between meals & bedtime for anxiety.   Georgina Quint, MD Unknown Active Self, Spouse/Significant Other, Pharmacy Records           Med Note Jory Ee, Garnet Koyanagi   Fri Jul 27, 2023  9:47 PM) LF: 07/12/23 for a 20ds  tamsulosin (FLOMAX) 0.4 MG CAPS capsule [id number] No Take 0.4 mg by mouth daily after breakfast. [provider] 07/27/2023 Active Self, Spouse/Significant Other, Pharmacy Records  zolpidem (AMBIEN) 10 MG tablet [id number] No Take 1 tablet (10 mg total) by mouth at bedtime as needed for sleep.  Patient taking differently: Take 10 mg by mouth at bedtime.   Georgina Quint, MD 07/26/2023 Active Self, Spouse/Significant Other,  Pharmacy Records           Med Note Silvestre Drum, Myrtle Atta   Fri Jul 27, 2023  9:45 PM) LF: 07/11/23 for a 30ds            Home Care and Equipment/Supplies: Were Home Health Services Ordered?: NA Any  new equipment or medical supplies ordered?: NA  Functional Questionnaire: Do you need assistance with bathing/showering or dressing?: No Do you need assistance with meal preparation?: No Do you need assistance with eating?: No Do you have difficulty maintaining continence: No Do you need assistance with getting out of bed/getting out of a chair/moving?: No Do you have difficulty managing or taking your medications?: No  Follow up appointments reviewed: PCP Follow-up appointment confirmed?: Yes Date of PCP follow-up appointment?: 08/07/23 Follow-up Provider: [facility] Follow-up appointment confirmed?: NA Do you need transportation to your follow-up appointment?: No Do you understand care options if your condition(s) worsen?: Yes-patient verbalized understanding    SIGNATURE Darrall Ellison, LPN [facility] Nurse Health Advisor Direct Dial [phone]

## 2023-07-31 NOTE — Transitions of Care (Post Inpatient/ED Visit) (Signed)
   07/31/2023  Name: TAOS TAPP MRN: [id number] DOB: 1945-09-04  Today's TOC FU Call Status: Today's TOC FU Call Status:: Unsuccessful Call (1st Attempt) Unsuccessful Call (1st Attempt) Date: 07/31/23  Attempted to reach the patient regarding the most recent Inpatient/ED visit.  Follow Up Plan: Additional outreach attempts will be made to reach the patient to complete the Transitions of Care (Post Inpatient/ED visit) call.  Darrall Ellison, LPN [facility] Nurse Health Advisor Direct Dial [phone]  Signature Darrall Ellison, LPN [facility] Nurse Health Advisor Direct Dial [phone]

## 2023-08-01 LAB — CULTURE, BLOOD (ROUTINE X 2)
Culture: NO GROWTH
Culture: NO GROWTH

## 2023-08-07 ENCOUNTER — Encounter: Payer: Self-pay | Admitting: Emergency Medicine

## 2023-08-07 ENCOUNTER — Ambulatory Visit (INDEPENDENT_AMBULATORY_CARE_PROVIDER_SITE_OTHER): Admitting: Emergency Medicine

## 2023-08-07 ENCOUNTER — Other Ambulatory Visit: Payer: Self-pay | Admitting: Emergency Medicine

## 2023-08-07 VITALS — BP 130/78 | HR 82 | Temp 98.9°F | Ht 64.0 in | Wt 177.0 lb

## 2023-08-07 DIAGNOSIS — N179 Acute kidney failure, unspecified: Secondary | ICD-10-CM | POA: Diagnosis not present

## 2023-08-07 DIAGNOSIS — A020 Salmonella enteritis: Secondary | ICD-10-CM | POA: Insufficient documentation

## 2023-08-07 DIAGNOSIS — G894 Chronic pain syndrome: Secondary | ICD-10-CM

## 2023-08-07 DIAGNOSIS — Z09 Encounter for follow-up examination after completed treatment for conditions other than malignant neoplasm: Secondary | ICD-10-CM | POA: Diagnosis not present

## 2023-08-07 DIAGNOSIS — E039 Hypothyroidism, unspecified: Secondary | ICD-10-CM | POA: Diagnosis not present

## 2023-08-07 DIAGNOSIS — R197 Diarrhea, unspecified: Secondary | ICD-10-CM | POA: Diagnosis not present

## 2023-08-07 DIAGNOSIS — I10 Essential (primary) hypertension: Secondary | ICD-10-CM

## 2023-08-07 LAB — URINALYSIS, ROUTINE W REFLEX MICROSCOPIC
Bilirubin Urine: NEGATIVE
Ketones, ur: NEGATIVE
Leukocytes,Ua: NEGATIVE
Nitrite: NEGATIVE
Specific Gravity, Urine: 1.01 (ref 1.000–1.030)
Urine Glucose: NEGATIVE
Urobilinogen, UA: 0.2 (ref 0.0–1.0)
WBC, UA: NONE SEEN (ref 0–?)
pH: 6 (ref 5.0–8.0)

## 2023-08-07 LAB — CBC WITH DIFFERENTIAL/PLATELET
Basophils Absolute: 0 10*3/uL (ref 0.0–0.1)
Basophils Relative: 0.5 % (ref 0.0–3.0)
Eosinophils Absolute: 0.1 10*3/uL (ref 0.0–0.7)
Eosinophils Relative: 1 % (ref 0.0–5.0)
HCT: 40.7 % (ref 39.0–52.0)
Hemoglobin: 13.4 g/dL (ref 13.0–17.0)
Lymphocytes Relative: 14.6 % (ref 12.0–46.0)
Lymphs Abs: 1.2 10*3/uL (ref 0.7–4.0)
MCHC: 33 g/dL (ref 30.0–36.0)
MCV: 83.4 fl (ref 78.0–100.0)
Monocytes Absolute: 0.7 10*3/uL (ref 0.1–1.0)
Monocytes Relative: 8.5 % (ref 3.0–12.0)
Neutro Abs: 6.1 10*3/uL (ref 1.4–7.7)
Neutrophils Relative %: 75.4 % (ref 43.0–77.0)
Platelets: 347 10*3/uL (ref 150.0–400.0)
RBC: 4.88 Mil/uL (ref 4.22–5.81)
RDW: 14.5 % (ref 11.5–15.5)
WBC: 8.1 10*3/uL (ref 4.0–10.5)

## 2023-08-07 LAB — COMPREHENSIVE METABOLIC PANEL WITH GFR
ALT: 16 U/L (ref 0–53)
AST: 14 U/L (ref 0–37)
Albumin: 3.6 g/dL (ref 3.5–5.2)
Alkaline Phosphatase: 85 U/L (ref 39–117)
BUN: 14 mg/dL (ref 6–23)
CO2: 25 meq/L (ref 19–32)
Calcium: 9.4 mg/dL (ref 8.4–10.5)
Chloride: 102 meq/L (ref 96–112)
Creatinine, Ser: 1.05 mg/dL (ref 0.40–1.50)
GFR: 69.06 mL/min (ref >60.00)
Glucose, Bld: 90 mg/dL (ref 70–99)
Potassium: 3.8 meq/L (ref 3.5–5.1)
Sodium: 135 meq/L (ref 135–145)
Total Bilirubin: 1.6 mg/dL — ABNORMAL HIGH (ref 0.2–1.2)
Total Protein: 6.9 g/dL (ref 6.0–8.3)

## 2023-08-07 NOTE — Assessment & Plan Note (Signed)
 Clinically euthyroid. Continue Synthroid 125 mcg daily.

## 2023-08-07 NOTE — Assessment & Plan Note (Signed)
 Clinically stable.  Well-hydrated. No red flag signs or symptoms. Continues to have low-grade fever and generalized achiness at home Blood work done today Diet and nutrition discussed Advised to contact the office if no better or worsening in the next several days Symptom management discussed

## 2023-08-07 NOTE — Assessment & Plan Note (Signed)
 BP Readings from Last 3 Encounters:  08/07/23 130/78  07/30/23 137/80  07/17/23 (!) 142/80  Well-controlled hypertension, off medication Prior to hospital admission patient was taking Lotensin  HCT but due to hypotension it was discontinued.  Started medication on 08/02/2023 as instructed but felt bad and did not take it again. Continues to be normotensive off medication. Advised to continue monitoring blood pressure readings at home daily for the next several days and keep a log.  Advised to contact the office if numbers persistently abnormal.

## 2023-08-07 NOTE — Assessment & Plan Note (Signed)
 Secondary to salmonellosis.  Much improved today. Advised to stay well-hydrated

## 2023-08-07 NOTE — Assessment & Plan Note (Signed)
 Was able to establish care with local pain management doctor recently. However treatment with opiates failed.  Told not much else they can do for him.  Was advised to follow-up with his PCP.

## 2023-08-07 NOTE — Patient Instructions (Signed)
 Health Maintenance After Age 76 After age 4, you are at a higher risk for certain long-term diseases and infections as well as injuries from falls. Falls are a major cause of broken bones and head injuries in people who are older than age 47. Getting regular preventive care can help to keep you healthy and well. Preventive care includes getting regular testing and making lifestyle changes as recommended by your health care provider. Talk with your health care provider about: Which screenings and tests you should have. A screening is a test that checks for a disease when you have no symptoms. A diet and exercise plan that is right for you. What should I know about screenings and tests to prevent falls? Screening and testing are the best ways to find a health problem early. Early diagnosis and treatment give you the best chance of managing medical conditions that are common after age 37. Certain conditions and lifestyle choices may make you more likely to have a fall. Your health care provider may recommend: Regular vision checks. Poor vision and conditions such as cataracts can make you more likely to have a fall. If you wear glasses, make sure to get your prescription updated if your vision changes. Medicine review. Work with your health care provider to regularly review all of the medicines you are taking, including over-the-counter medicines. Ask your health care provider about any side effects that may make you more likely to have a fall. Tell your health care provider if any medicines that you take make you feel dizzy or sleepy. Strength and balance checks. Your health care provider may recommend certain tests to check your strength and balance while standing, walking, or changing positions. Foot health exam. Foot pain and numbness, as well as not wearing proper footwear, can make you more likely to have a fall. Screenings, including: Osteoporosis screening. Osteoporosis is a condition that causes  the bones to get weaker and break more easily. Blood pressure screening. Blood pressure changes and medicines to control blood pressure can make you feel dizzy. Depression screening. You may be more likely to have a fall if you have a fear of falling, feel depressed, or feel unable to do activities that you used to do. Alcohol use screening. Using too much alcohol can affect your balance and may make you more likely to have a fall. Follow these instructions at home: Lifestyle Do not drink alcohol if: Your health care provider tells you not to drink. If you drink alcohol: Limit how much you have to: 0-1 drink a day for women. 0-2 drinks a day for men. Know how much alcohol is in your drink. In the [place] one drink equals one 12 oz bottle of beer (355 mL), one 5 oz glass of wine (148 mL), or one 1 oz glass of hard liquor (44 mL). Do not use any products that contain nicotine or tobacco. These products include cigarettes, chewing tobacco, and vaping devices, such as e-cigarettes. If you need help quitting, ask your health care provider. Activity  Follow a regular exercise program to stay fit. This will help you maintain your balance. Ask your health care provider what types of exercise are appropriate for you. If you need a cane or walker, use it as recommended by your health care provider. Wear supportive shoes that have nonskid soles. Safety  Remove any tripping hazards, such as rugs, cords, and clutter. Install safety equipment such as grab bars in bathrooms and safety rails on stairs. Keep rooms and walkways  well-lit. General instructions Talk with your health care provider about your risks for falling. Tell your health care provider if: You fall. Be sure to tell your health care provider about all falls, even ones that seem minor. You feel dizzy, tiredness (fatigue), or off-balance. Take over-the-counter and prescription medicines only as told by your health care provider. These include  supplements. Eat a healthy diet and maintain a healthy weight. A healthy diet includes low-fat dairy products, low-fat (lean) meats, and fiber from whole grains, beans, and lots of fruits and vegetables. Stay current with your vaccines. Schedule regular health, dental, and eye exams. Summary Having a healthy lifestyle and getting preventive care can help to protect your health and wellness after age 11. Screening and testing are the best way to find a health problem early and help you avoid having a fall. Early diagnosis and treatment give you the best chance for managing medical conditions that are more common for people who are older than age 28. Falls are a major cause of broken bones and head injuries in people who are older than age 48. Take precautions to prevent a fall at home. Work with your health care provider to learn what changes you can make to improve your health and wellness and to prevent falls. This information is not intended to replace advice given to you by your health care provider. Make sure you discuss any questions you have with your health care provider. Document Revised: 08/23/2020 Document Reviewed: 08/23/2020 Elsevier Patient Education  2024 [employer].

## 2023-08-07 NOTE — Progress Notes (Signed)
 Henry [name] 78 y.o.   Chief Complaint  Patient presents with   Hospitalization Follow-up    Patient here for HFU. Its been week since he left the Hospital for salmonella. he says he feels worse than how he was in the hospital. No more diarrhea, he c/o of all his joints ache and swell. Has a fever that comes and goes     HISTORY OF PRESENT ILLNESS: This is a 78 y.o. male here for hospital discharge follow-up. Still having some achiness to his joints and low-grade fever but no more diarrhea  Physician Discharge Summary  OLUWADAMILARE TOBLER ZOX:[id number] DOB: 1945-12-24 DOA: 07/27/2023   PCP: Elvira Hammersmith, MD   Admit date: 07/27/2023 Discharge date: 07/30/2023   Admitted From: Home Disposition: Home   Recommendations for Outpatient Follow-up:  Follow up with PCP in 1-2 weeks Please obtain BMP/CBC in one week     Home Health: N/A Equipment/Devices: N/A   Discharge Condition: Stable CODE STATUS: Full code Diet recommendation: Low-salt diet, plenty of hydration   Discharge summary: 78 year old with history of hypertension, hypothyroidism and chronic pain syndrome and anxiety depression, history of cholecystectomy and previous bile leak and colon perforation, multiple abdominal wall hernias presented to the emergency room with loose watery stool 15 times a day, abdominal cramping and shortness of breath. Blood pressure 70/55 on arrival. Potassium 3.2. Creatinine 7.4 with recent baseline creatinine normal. Admitted with AKI, severe dehydration, diarrhea. Wife also had transient diarrhea. Urinalysis was abnormal, however patient urinating very low with diarrhea.  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  Treated with IV fluids, oral hydration with almost complete resolution of symptoms.  Renal functions normalized.   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  Can use Imodium .  Treated with maintenance IV fluids.  Already  improving.  No indication for antibiotics.    Acute renal failure with ATN, hypokalemia: Presented with creatinine of 7.4 with recent normal creatinine.  Abdominal scan without evidence of hydronephrosis or outlet obstruction.  Excellent clinical response on fluid resuscitation.  Urine output is adequate.  Creatinine 1.3 today.  Encourage oral intake.  Patient was also on benazepril  hydrochlorothiazide  at home.   Potassium is replaced and adequate.   Hypertension: As above.  Blood pressures not picking up.  With clinical improvement, encouraged to resume benazepril  hydrochlorothiazide  on 4/17.  Will need repeat renal function test in about 1 week.   Chronic pain syndrome, anxiety and depression: Patient on tramadol , Ativan  that he will continue.   Suspected UTI: Urine culture with less than 10,000 colonies.  No indication for antibiotics.  His urinary symptoms improved after resuming his Flomax .     Stable for discharge.   Discharge Diagnoses:  Principal Problem:   AKI (acute kidney injury) (HCC) Active Problems:   Diarrhea   Hypothyroidism   UTI (urinary tract infection)   Hypotension   Hyponatremia   Hypokalemia   Intractable diarrhea      HPI   Prior to Admission medications   Medication Sig Start Date End Date Taking? Authorizing Provider  acetaminophen  (TYLENOL ) 650 MG CR tablet Take 325-600 mg by mouth every 8 (eight) hours as needed for pain.   Yes [provider]  benazepril -hydrochlorthiazide (LOTENSIN  HCT) 20-25 MG tablet TAKE 1 TABLET BY MOUTH EVERY DAY 03/18/23  Yes Jermeka Schlotterbeck, Isidro Margo, MD  cholecalciferol (VITAMIN D) 1000 UNITS tablet Take 1,000 Units by mouth daily.   Yes [provider]  fish oil-omega-3 fatty acids 1000 MG  capsule Take 2 g by mouth daily.   Yes [provider]  ibuprofen (ADVIL) 200 MG tablet Take 200 mg by mouth every 6 (six) hours as needed for moderate pain (pain score 4-6), fever or mild pain (pain score 1-3).    Yes [provider]  levothyroxine  (SYNTHROID ) 125 MCG tablet TAKE 1 TABLET BY MOUTH EVERY DAY BEFORE BREAKFAST Patient taking differently: Take 125 mcg by mouth daily before breakfast. 03/29/22  Yes Elvin Banker, Isidro Margo, MD  Loperamide  HCl (IMODIUM  PO) Take 1-2 tablets by mouth as needed.   Yes [provider]  LORazepam  (ATIVAN ) 1 MG tablet TAKE 1 TABLET BY MOUTH EVERY DAY AS NEEDED FOR ANXIETY Patient taking differently: Take 1 mg by mouth 3 times/day as needed-between meals & bedtime for anxiety. 07/12/23  Yes Antanisha Mohs, Isidro Margo, MD  tamsulosin  (FLOMAX ) 0.4 MG CAPS capsule Take 0.4 mg by mouth daily after breakfast.   Yes [provider]  zolpidem  (AMBIEN ) 10 MG tablet Take 1 tablet (10 mg total) by mouth at bedtime as needed for sleep. Patient taking differently: Take 10 mg by mouth at bedtime. 02/14/23  Yes Elvira Hammersmith, MD    Allergies  Allergen Reactions   Ciprofloxacin Itching and Rash    Patient Active Problem List   Diagnosis Date Noted   UTI (urinary tract infection) 07/28/2023   Hypotension 07/28/2023   Hyponatremia 07/28/2023   Hypokalemia 07/28/2023   Intractable diarrhea 07/28/2023   AKI (acute kidney injury) (HCC) 07/27/2023   Adjustment reaction with anxiety and depression 07/17/2023   Current moderate episode of major depressive disorder without prior episode (HCC) 09/07/2021   Diarrhea 09/07/2016   Iron deficiency anemia due to chronic blood loss 09/07/2016   Hypothyroidism due to acquired atrophy of thyroid  08/15/2016   Degenerative disc disease, lumbar 03/16/2014   Insomnia 03/16/2014   Chronic pain syndrome 03/16/2014   Hypothyroidism 05/02/2012   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   Essential hypertension 07/19/2007    Past Medical History:  Diagnosis Date   Anxiety    Arthritis    Degenerative disc disease, lumbar    Hypertension     Hypothyroidism    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   CRWON SCALP- [place] MOHS    Past Surgical History:  Procedure Laterality Date   CHOLECYSTECTOMY     COLON SURGERY  08/07/2006   colostomy & small bowel resection   FRACTURE SURGERY     left shoulder   SPINE SURGERY     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    Social History   Socioeconomic History   Marital status: Married    Spouse name: Not on file   Number of children: Not on file   Years of education: Not on file   Highest education level: Not on file  Occupational History   Not on file  Tobacco Use   Smoking status: Never    Passive exposure: Never   Smokeless tobacco: Never  Vaping Use   Vaping status: Not on file  Substance and Sexual Activity   Alcohol use: Yes   Drug use: Never   Sexual activity: Yes  Other Topics Concern   Not on file  Social History Narrative   Not on file   Social Drivers of Health   Financial Resource Strain: Low Risk  (07/17/2023)   Overall Financial Resource Strain (CARDIA)    Difficulty of Paying Living Expenses: Not hard  at all  Food Insecurity: No Food Insecurity (07/29/2023)   Hunger Vital Sign    Worried About Running Out of Food in the Last Year: Never true    Ran Out of Food in the Last Year: Never true  Transportation Needs: No Transportation Needs (07/29/2023)   PRAPARE - Administrator, Civil Service (Medical): No    Lack of Transportation (Non-Medical): No  Physical Activity: Inactive (07/17/2023)   Exercise Vital Sign    Days of Exercise per Week: 0 days    Minutes of Exercise per Session: 0 min  Stress: Stress Concern Present (07/17/2023)   [employer] of Occupational Health - Occupational Stress Questionnaire    Feeling of Stress : Very much  Social Connections: Socially Integrated (07/29/2023)   Social Connection and Isolation Panel [NHANES]    Frequency of Communication with Friends and Family: More than three times a week    Frequency  of Social Gatherings with Friends and Family: More than three times a week    Attends Religious Services: More than 4 times per year    Active Member of [employer] or Organizations: Yes    Attends Banker Meetings: 1 to 4 times per year    Marital Status: Married  Catering manager Violence: Not At Risk (07/29/2023)   Humiliation, Afraid, Rape, and Kick questionnaire    Fear of Current or Ex-Partner: No    Emotionally Abused: No    Physically Abused: No    Sexually Abused: No    Family History  Problem Relation Age of Onset   Heart disease Mother        CAD and CHF     Review of Systems  Constitutional:  Positive for fever.  HENT: Negative.  Negative for congestion and sore throat.   Respiratory: Negative.  Negative for cough and shortness of breath.   Cardiovascular: Negative.  Negative for chest pain and palpitations.  Gastrointestinal:  Negative for abdominal pain, diarrhea, nausea and vomiting.  Musculoskeletal:  Positive for joint pain.  Skin: Negative.  Negative for rash.  Neurological: Negative.  Negative for dizziness and headaches.    Vitals:   08/07/23 0840  BP: 130/78  Pulse: 82  Temp: 98.9 F (37.2 C)  SpO2: 98%    Physical Exam Vitals reviewed.  Constitutional:      Appearance: Normal appearance.  HENT:     Head: Normocephalic.     Mouth/Throat:     Mouth: Mucous membranes are moist.     Pharynx: Oropharynx is clear.  Eyes:     Extraocular Movements: Extraocular movements intact.     Pupils: Pupils are equal, round, and reactive to light.  Cardiovascular:     Rate and Rhythm: Normal rate and regular rhythm.     Pulses: Normal pulses.     Heart sounds: Normal heart sounds.  Pulmonary:     Effort: Pulmonary effort is normal.     Breath sounds: Normal breath sounds.  Abdominal:     Palpations: Abdomen is soft.     Tenderness: There is no abdominal tenderness.  Musculoskeletal:     Cervical back: No tenderness.     Right lower leg: No  edema.     Left lower leg: No edema.  Lymphadenopathy:     Cervical: No cervical adenopathy.  Skin:    General: Skin is warm and dry.     Capillary Refill: Capillary refill takes less than 2 seconds.  Neurological:     General: No  focal deficit present.     Mental Status: He is alert and oriented to person, place, and time.  Psychiatric:        Mood and Affect: Mood normal.        Behavior: Behavior normal.     ASSESSMENT & PLAN: A total of 45 minutes was spent with the patient and counseling/coordination of care regarding preparing for this visit, review of most recent office visit notes, review of hospital discharge summary, review of multiple chronic medical conditions and their management, review of all medications, review of most recent bloodwork results, review of health maintenance items, education on nutrition, prognosis, documentation, and need for follow up.  Problem List Items Addressed This Visit       Cardiovascular and Mediastinum   Essential hypertension   BP Readings from Last 3 Encounters:  08/07/23 130/78  07/30/23 137/80  07/17/23 (!) 142/80  Well-controlled hypertension, off medication Prior to hospital admission patient was taking Lotensin  HCT but due to hypotension it was discontinued.  Started medication on 08/02/2023 as instructed but felt bad and did not take it again. Continues to be normotensive off medication. Advised to continue monitoring blood pressure readings at home daily for the next several days and keep a log.  Advised to contact the office if numbers persistently abnormal.         Digestive   Intractable diarrhea   Secondary to salmonellosis.  Much improved today. Advised to stay well-hydrated        Endocrine   Hypothyroidism   Clinically euthyroid. Continue Synthroid  125 mcg daily.         Genitourinary   AKI (acute kidney injury) (HCC)   Much improved after hospital treatment Will repeat CMP today Advised to stay  well-hydrated and avoid NSAIDs        Other   Chronic pain syndrome   Was able to establish care with local pain management doctor recently. However treatment with opiates failed.  Told not much else they can do for him.  Was advised to follow-up with his PCP.      Salmonellosis - Primary   Clinically stable.  Well-hydrated. No red flag signs or symptoms. Continues to have low-grade fever and generalized achiness at home Blood work done today Diet and nutrition discussed Advised to contact the office if no better or worsening in the next several days Symptom management discussed      Relevant Orders   Comprehensive metabolic panel with GFR   CBC with Differential/Platelet   Other Visit Diagnoses       Hospital discharge follow-up          Patient Instructions  Health Maintenance After Age 74 After age 38, you are at a higher risk for certain long-term diseases and infections as well as injuries from falls. Falls are a major cause of broken bones and head injuries in people who are older than age 69. Getting regular preventive care can help to keep you healthy and well. Preventive care includes getting regular testing and making lifestyle changes as recommended by your health care provider. Talk with your health care provider about: Which screenings and tests you should have. A screening is a test that checks for a disease when you have no symptoms. A diet and exercise plan that is right for you. What should I know about screenings and tests to prevent falls? Screening and testing are the best ways to find a health problem early. Early diagnosis and treatment give you the  best chance of managing medical conditions that are common after age 9. Certain conditions and lifestyle choices may make you more likely to have a fall. Your health care provider may recommend: Regular vision checks. Poor vision and conditions such as cataracts can make you more likely to have a fall. If you  wear glasses, make sure to get your prescription updated if your vision changes. Medicine review. Work with your health care provider to regularly review all of the medicines you are taking, including over-the-counter medicines. Ask your health care provider about any side effects that may make you more likely to have a fall. Tell your health care provider if any medicines that you take make you feel dizzy or sleepy. Strength and balance checks. Your health care provider may recommend certain tests to check your strength and balance while standing, walking, or changing positions. Foot health exam. Foot pain and numbness, as well as not wearing proper footwear, can make you more likely to have a fall. Screenings, including: Osteoporosis screening. Osteoporosis is a condition that causes the bones to get weaker and break more easily. Blood pressure screening. Blood pressure changes and medicines to control blood pressure can make you feel dizzy. Depression screening. You may be more likely to have a fall if you have a fear of falling, feel depressed, or feel unable to do activities that you used to do. Alcohol use screening. Using too much alcohol can affect your balance and may make you more likely to have a fall. Follow these instructions at home: Lifestyle Do not drink alcohol if: Your health care provider tells you not to drink. If you drink alcohol: Limit how much you have to: 0-1 drink a day for women. 0-2 drinks a day for men. Know how much alcohol is in your drink. In the [place] one drink equals one 12 oz bottle of beer (355 mL), one 5 oz glass of wine (148 mL), or one 1 oz glass of hard liquor (44 mL). Do not use any products that contain nicotine or tobacco. These products include cigarettes, chewing tobacco, and vaping devices, such as e-cigarettes. If you need help quitting, ask your health care provider. Activity  Follow a regular exercise program to stay fit. This will help you  maintain your balance. Ask your health care provider what types of exercise are appropriate for you. If you need a cane or walker, use it as recommended by your health care provider. Wear supportive shoes that have nonskid soles. Safety  Remove any tripping hazards, such as rugs, cords, and clutter. Install safety equipment such as grab bars in bathrooms and safety rails on stairs. Keep rooms and walkways well-lit. General instructions Talk with your health care provider about your risks for falling. Tell your health care provider if: You fall. Be sure to tell your health care provider about all falls, even ones that seem minor. You feel dizzy, tiredness (fatigue), or off-balance. Take over-the-counter and prescription medicines only as told by your health care provider. These include supplements. Eat a healthy diet and maintain a healthy weight. A healthy diet includes low-fat dairy products, low-fat (lean) meats, and fiber from whole grains, beans, and lots of fruits and vegetables. Stay current with your vaccines. Schedule regular health, dental, and eye exams. Summary Having a healthy lifestyle and getting preventive care can help to protect your health and wellness after age 44. Screening and testing are the best way to find a health problem early and help you avoid having a  fall. Early diagnosis and treatment give you the best chance for managing medical conditions that are more common for people who are older than age 20. Falls are a major cause of broken bones and head injuries in people who are older than age 43. Take precautions to prevent a fall at home. Work with your health care provider to learn what changes you can make to improve your health and wellness and to prevent falls. This information is not intended to replace advice given to you by your health care provider. Make sure you discuss any questions you have with your health care provider. Document Revised: 08/23/2020  Document Reviewed: 08/23/2020 Elsevier Patient Education  2024 Elsevier Inc.    Maryagnes Small, MD [facility]

## 2023-08-07 NOTE — Assessment & Plan Note (Signed)
 Much improved after hospital treatment Will repeat CMP today Advised to stay well-hydrated and avoid NSAIDs

## 2023-08-08 ENCOUNTER — Telehealth: Payer: Self-pay

## 2023-08-08 NOTE — Telephone Encounter (Signed)
 Forms are being worked on. Will call inform patient to allow up to 5-10 days to have this completed.

## 2023-08-08 NOTE — Telephone Encounter (Signed)
 Copied from CRM [id number]. Topic: Clinical - Medication Question >> Aug 08, 2023  1:49 PM [name] wrote: Reason for CRM: Patient states he was at the clinic yesterday and dropped off some [employer] paperwork for Dr. Vedia Geralds to sign off on for him to get his lidocaine  patches. Patient is calling to find the update on that paper work, please contact patient.

## 2023-08-10 ENCOUNTER — Encounter: Payer: Self-pay | Admitting: Internal Medicine

## 2023-08-12 ENCOUNTER — Other Ambulatory Visit: Payer: Self-pay | Admitting: Emergency Medicine

## 2023-08-12 DIAGNOSIS — F5104 Psychophysiologic insomnia: Secondary | ICD-10-CM

## 2023-08-17 ENCOUNTER — Telehealth: Payer: Self-pay

## 2023-08-17 ENCOUNTER — Other Ambulatory Visit: Payer: Self-pay | Admitting: Emergency Medicine

## 2023-08-17 DIAGNOSIS — F5104 Psychophysiologic insomnia: Secondary | ICD-10-CM

## 2023-08-17 MED ORDER — ZOLPIDEM TARTRATE 10 MG PO TABS: 10.0000 mg | ORAL_TABLET | Freq: Every evening | ORAL | 1 refills | Status: DC | PRN

## 2023-08-17 NOTE — Telephone Encounter (Signed)
 LVM for patient regarding the medication Ambien  has been sent to his pharmacy. Also informed patient the form for the lidocaine  patches will be faxed today

## 2023-08-17 NOTE — Telephone Encounter (Signed)
 Copied from CRM [id number]. Topic: Clinical - Prescription Issue >> Aug 16, 2023  3:32 PM Jenice Mitts wrote: Reason for CRM: Patient is calling in because [employer] cut his medication and now [facility] needs to a new prescription to fill the medication. zolpidem  (AMBIEN ) >> Aug 16, 2023  3:37 PM Adrionna Y wrote: He's been without the medication for five days now

## 2023-08-17 NOTE — Telephone Encounter (Signed)
 New Ambien  prescription sent to pharmacy requested today.  Thanks.

## 2023-08-21 LAB — MISCELLANEOUS TEST

## 2023-08-24 ENCOUNTER — Ambulatory Visit: Payer: Self-pay

## 2023-08-24 ENCOUNTER — Encounter: Payer: Self-pay | Admitting: Radiology

## 2023-08-24 NOTE — Telephone Encounter (Signed)
  Chief Complaint: joint swelling and pain Symptoms: joint swelling and pain, decreased appetite Frequency: x 1 month Pertinent Negatives: Patient denies chest pain, nausea, vomiting, diarrhea Disposition: [] ED /[] Urgent Care (no appt availability in office) / [x] Appointment(In office/virtual)/ []  [facility]/ [] Home Care/ [] Refused Recommended Disposition /[] [facility]/ []  Follow-up with PCP Additional Notes: Patient is on the phone for triage and states he is calling for himself and his wife. He states he has had a "fever" 98-99.9 degrees. Patient states he is drinking a lot of fluids. Advised patient to call back for new or worsening symptoms. Advised go to urgent care or ED if symptoms worsen over the weekend. Patient would also like to follow up regarding his pain patches and [employer], he states pain management needed to provide a prescription. He needs a copy of the form he provided the office so that he can give it to his pain management.  Copied from CRM [id number]. Topic: Clinical - Red Word Triage >> Aug 24, 2023  9:22 AM Martinique E wrote: [facility] that prompted transfer to Nurse Triage: Lingering effects from Norovirus. Patient stated he is having pain in his joints, rated it a 10 out of 10 for pain, and he is running a low-grade fever. Symptoms have been going on for 3 weeks. Reason for Disposition  [1] SEVERE pain (e.g., excruciating, unable to do any normal activities) AND [2] not improved 2 hours after pain medicine  Answer Assessment - Initial Assessment Questions 1. ONSET: "When did the muscle aches or body pains start?"      1 month ago.  2. LOCATION: "What part of your body is hurting?" (e.g., entire body, arms, legs)      Bilateral ankles, knees, hips, neck, back, shoulders.  3. SEVERITY: "How bad is the pain?" (Scale 1-10; or mild, moderate, severe)   - MILD (1-3): doesn't interfere with normal activities    - MODERATE (4-7): interferes with normal  activities or awakens from sleep    - SEVERE (8-10):  excruciating pain, unable to do any normal activities      10/10, he states every 8 hours he is taking 650mg  acetaminophen .  4. CAUSE: "What do you think is causing the pains?"     Patient was admitted to the hospital a month ago with salmonella food poisoning. He states he thinks this is related.  5. FEVER: "Have you been having fever?"     No, patient reports he has had a fever but reports the highest is 99.  6. OTHER SYMPTOMS: "Do you have any other symptoms?" (e.g., chest pain, weakness, rash, cold or flu symptoms, weight loss)     Weakness and states he is having to walk with a cane, weight loss (8 lbs over a month), SOB with exertion.  7. PREGNANCY: "Is there any chance you are pregnant?" "When was your last menstrual period?"     N/A.  8. TRAVEL: "Have you traveled out of the country in the last month?" (e.g., travel history, exposures)     No. Patient had salmonella a month ago.  Protocols used: Muscle Aches and Body Pain-A-AH

## 2023-08-27 ENCOUNTER — Encounter: Payer: Self-pay | Admitting: Radiology

## 2023-08-27 ENCOUNTER — Ambulatory Visit: Admitting: Emergency Medicine

## 2023-08-27 ENCOUNTER — Telehealth: Payer: Self-pay

## 2023-08-27 ENCOUNTER — Other Ambulatory Visit (HOSPITAL_COMMUNITY): Payer: Self-pay

## 2023-08-27 NOTE — Telephone Encounter (Signed)
 Pharmacy Patient Advocate Encounter  Received notification from [employer] that Prior Authorization for LIDOCAINE  PATCHES has been APPROVED from 04/18/2023 to 04/16/2024  PLEASE BE ADVISED APPROVAL LETTER HAS BEEN SCANNED IN MEDIA OF CHART

## 2023-08-28 ENCOUNTER — Ambulatory Visit: Admitting: Emergency Medicine

## 2023-08-28 ENCOUNTER — Encounter: Payer: Self-pay | Admitting: Emergency Medicine

## 2023-08-28 VITALS — BP 140/70 | HR 77 | Temp 98.2°F | Ht 64.0 in | Wt 173.0 lb

## 2023-08-28 DIAGNOSIS — M2550 Pain in unspecified joint: Secondary | ICD-10-CM | POA: Diagnosis not present

## 2023-08-28 DIAGNOSIS — G894 Chronic pain syndrome: Secondary | ICD-10-CM | POA: Diagnosis not present

## 2023-08-28 DIAGNOSIS — I10 Essential (primary) hypertension: Secondary | ICD-10-CM | POA: Diagnosis not present

## 2023-08-28 DIAGNOSIS — F5104 Psychophysiologic insomnia: Secondary | ICD-10-CM

## 2023-08-28 DIAGNOSIS — E039 Hypothyroidism, unspecified: Secondary | ICD-10-CM

## 2023-08-28 MED ORDER — DICLOFENAC SODIUM 75 MG PO TBEC: 75.0000 mg | DELAYED_RELEASE_TABLET | Freq: Two times a day (BID) | ORAL | 3 refills | Status: DC

## 2023-08-28 MED ORDER — DICLOFENAC SODIUM 1 % EX GEL
2.0000 g | Freq: Four times a day (QID) | CUTANEOUS | 3 refills | Status: DC
Start: 2023-08-28 — End: 2023-12-05

## 2023-08-28 NOTE — Progress Notes (Signed)
 Henry [name] 78 y.o.   Chief Complaint  Patient presents with   Joint Swelling    Patient states he is in a lot of pain at his Right ankle, knee, hip, Left knee, hip, shoulder, and back pain. he is having inflammation through all his joints. Pt hs been having low grade fever on and off throughout the day since he left the hospital. He mentions having bowel issues, losing ability to walk. He is taking 8hr Arthritis acetaminophen  650mg  which only helps a little. Patient states the same day he had the really bad swell he had some sore/bumps on his hands    HISTORY OF PRESENT ILLNESS: This is a 78 y.o. male complaining of pain and inflammation to multiple joints including hands, wrists, knees, and ankles. Presently taking combination of ibuprofen, naproxen, and Tylenol . Had some bumps in his hands which are now healing feeling better.  Uses walker.  No recent falls. Recent Salmonella infection which has now resolved. No other complaints or medical concerns today.  HPI   Prior to Admission medications   Medication Sig Start Date End Date Taking? Authorizing Provider  acetaminophen  (TYLENOL ) 650 MG CR tablet Take 325-600 mg by mouth every 8 (eight) hours as needed for pain.   Yes [provider]  benazepril -hydrochlorthiazide (LOTENSIN  HCT) 20-25 MG tablet TAKE 1 TABLET BY MOUTH EVERY DAY 03/18/23  Yes Dyamond Tolosa, Isidro Margo, MD  cholecalciferol (VITAMIN D) 1000 UNITS tablet Take 1,000 Units by mouth daily.   Yes [provider]  fish oil-omega-3 fatty acids 1000 MG capsule Take 2 g by mouth daily.   Yes [provider]  ibuprofen (ADVIL) 200 MG tablet Take 200 mg by mouth every 6 (six) hours as needed for moderate pain (pain score 4-6), fever or mild pain (pain score 1-3).   Yes [provider]  levothyroxine  (SYNTHROID ) 125 MCG tablet TAKE 1 TABLET BY MOUTH EVERY DAY BEFORE BREAKFAST Patient taking differently: Take 125 mcg by mouth daily before  breakfast. 03/29/22  Yes Burdell Peed, Isidro Margo, MD  Loperamide  HCl (IMODIUM  PO) Take 1-2 tablets by mouth as needed.   Yes [provider]  LORazepam  (ATIVAN ) 1 MG tablet TAKE 1 TABLET BY MOUTH EVERY DAY AS NEEDED FOR ANXIETY Patient taking differently: Take 1 mg by mouth 3 times/day as needed-between meals & bedtime for anxiety. 07/12/23  Yes Syanne Looney, Isidro Margo, MD  tamsulosin  (FLOMAX ) 0.4 MG CAPS capsule Take 0.4 mg by mouth daily after breakfast.   Yes [provider]  zolpidem  (AMBIEN ) 10 MG tablet Take 1 tablet (10 mg total) by mouth at bedtime as needed. for sleep 08/17/23  Yes Teresina Bugaj, Isidro Margo, MD  diclofenac  (VOLTAREN ) 75 MG EC tablet Take 1 tablet (75 mg total) by mouth 2 (two) times daily. 08/28/23  Yes Pernella Ackerley, Isidro Margo, MD  diclofenac  Sodium (VOLTAREN ) 1 % GEL Apply 2 g topically 4 (four) times daily. 08/28/23  Yes Elvira Hammersmith, MD    Allergies  Allergen Reactions   Ciprofloxacin Itching and Rash    Patient Active Problem List   Diagnosis Date Noted   UTI (urinary tract infection) 07/28/2023   AKI (acute kidney injury) (HCC) 07/27/2023   Adjustment reaction with anxiety and depression 07/17/2023   Current moderate episode of major depressive disorder without prior episode (HCC) 09/07/2021   Iron deficiency anemia due to chronic blood loss 09/07/2016   Hypothyroidism due to acquired atrophy of thyroid  08/15/2016   Degenerative disc disease, lumbar 03/16/2014   Insomnia 03/16/2014  Chronic pain syndrome 03/16/2014   Hypothyroidism 05/02/2012   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   Essential hypertension 07/19/2007    Past Medical History:  Diagnosis Date   Anxiety    Arthritis    Degenerative disc disease, lumbar    Hypertension    Hypothyroidism    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   CRW[place]AL[place]- [place] MOHS    Past Surgical History:  Procedure  Laterality Date   CHOLECYSTECTOMY     COLON SURGERY  08/07/2006   colostomy & small bowel resection   FRACTURE SURGERY     left shoulder   SPINE SURGERY     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    Social History   Socioeconomic History   Marital status: Married    Spouse name: Not on file   Number of children: Not on file   Years of education: Not on file   Highest education level: Not on file  Occupational History   Not on file  Tobacco Use   Smoking status: Never    Passive exposure: Never   Smokeless tobacco: Never  Vaping Use   Vaping status: Not on file  Substance and Sexual Activity   Alcohol use: Yes   Drug use: Never   Sexual activity: Yes  Other Topics Concern   Not on file  Social History Narrative   Not on file   Social Drivers of Health   Financial Resource Strain: Low Risk  (07/17/2023)   Overall Financial Resource Strain (CARDIA)    Difficulty of Paying Living Expenses: Not hard at all  Food Insecurity: No Food Insecurity (07/29/2023)   Hunger Vital Sign    Worried About Running Out of Food in the Last Year: Never true    Ran Out of Food in the Last Year: Never true  Transportation Needs: No Transportation Needs (07/29/2023)   PRAPARE - Administrator, Civil Service (Medical): No    Lack of Transportation (Non-Medical): No  Physical Activity: Inactive (07/17/2023)   Exercise Vital Sign    Days of Exercise per Week: 0 days    Minutes of Exercise per Session: 0 min  Stress: Stress Concern Present (07/17/2023)   [employer] of Occupational Health - Occupational Stress Questionnaire    Feeling of Stress : Very much  Social Connections: Socially Integrated (07/29/2023)   Social Connection and Isolation Panel [NHANES]    Frequency of Communication with Friends and Family: More than three times a week    Frequency of Social Gatherings with Friends and Family: More than three times a week    Attends Religious Services: More than 4 times per  year    Active Member of [employer] or Organizations: Yes    Attends Banker Meetings: 1 to 4 times per year    Marital Status: Married  Catering manager Violence: Not At Risk (07/29/2023)   Humiliation, Afraid, Rape, and Kick questionnaire    Fear of Current or Ex-Partner: No    Emotionally Abused: No    Physically Abused: No    Sexually Abused: No    Family History  Problem Relation Age of Onset   Heart disease Mother        CAD and CHF     Review of Systems  Constitutional: Negative.  Negative for chills and fever.  HENT: Negative.  Negative for congestion and sore throat.   Respiratory: Negative.  Negative for  cough and shortness of breath.   Cardiovascular: Negative.  Negative for chest pain and palpitations.  Gastrointestinal:  Negative for abdominal pain, diarrhea, nausea and vomiting.  Genitourinary: Negative.  Negative for dysuria and hematuria.  Musculoskeletal:  Positive for joint pain.  Skin: Negative.  Negative for rash.  Neurological:  Negative for dizziness and headaches.  Psychiatric/Behavioral:  The patient has insomnia.   All other systems reviewed and are negative.   Vitals:   08/28/23 1326  BP: (!) 140/70  Pulse: 77  Temp: 98.2 F (36.8 C)  SpO2: 97%    Physical Exam Vitals reviewed.  Constitutional:      Appearance: Normal appearance.  HENT:     Head: Normocephalic.     Mouth/Throat:     Mouth: Mucous membranes are moist.     Pharynx: Oropharynx is clear.  Eyes:     Extraocular Movements: Extraocular movements intact.     Pupils: Pupils are equal, round, and reactive to light.  Cardiovascular:     Rate and Rhythm: Normal rate and regular rhythm.     Pulses: Normal pulses.     Heart sounds: Normal heart sounds.  Pulmonary:     Effort: Pulmonary effort is normal.     Breath sounds: Normal breath sounds.  Abdominal:     Palpations: Abdomen is soft.     Tenderness: There is no abdominal tenderness.  Musculoskeletal:         General: Swelling and tenderness present.     Cervical back: No tenderness.     Right lower leg: Edema present.     Left lower leg: Edema present.  Lymphadenopathy:     Cervical: No cervical adenopathy.  Skin:    General: Skin is warm and dry.     Capillary Refill: Capillary refill takes less than 2 seconds.  Neurological:     General: No focal deficit present.     Mental Status: He is alert and oriented to person, place, and time.  Psychiatric:        Mood and Affect: Mood normal.        Behavior: Behavior normal.     ASSESSMENT & PLAN: A total of 46 minutes was spent with the patient and counseling/coordination of care regarding preparing for this visit, review of most recent office visit notes, review of multiple chronic medical conditions and their management, review of all medications, pain management, review of most recent bloodwork results, review of health maintenance items, education on nutrition, prognosis, documentation, and need for follow up.   Problem List Items Addressed This Visit       Cardiovascular and Mediastinum   Essential hypertension   BP Readings from Last 3 Encounters:  08/28/23 (!) 140/70  08/07/23 130/78  07/30/23 137/80  Well-controlled hypertension, off medication Prior to hospital admission patient was taking Lotensin  HCT but due to hypotension it was discontinued.  Started medication on 08/02/2023 as instructed but felt bad and did not take it again. Continues to be normotensive off medication. Advised to continue monitoring blood pressure readings at home daily for the next several days and keep a log.  Advised to contact the office if numbers persistently abnormal.       Relevant Orders   AMB Referral [facility] Care Management     Endocrine   Hypothyroidism   Clinically euthyroid. Continue Synthroid  125 mcg daily       Relevant Orders   AMB Referral [facility] Care Management     Other   Insomnia  Well-controlled on Ambien . Has been taking  Ambien  for many years Recommend to continue New prescription for Ambien  10 mg sent to pharmacy of record today.      Relevant Orders   AMB Referral [facility] Care Management   Chronic pain syndrome   Was able to establish care with local pain management doctor recently. However treatment with opiates failed.  Told not much else they can do for him.  Was advised to follow-up with his PCP.      Relevant Medications   diclofenac  (VOLTAREN ) 75 MG EC tablet   Other Relevant Orders   AMB Referral [facility] Care Management   Arthralgia of multiple joints - Primary   Active and affecting quality of life Pain management discussed Has ongoing inflammation on physical exam Patient does not like or tolerate opiates Advised to continue Tylenol  Recommend to start diclofenac  75 mg twice a day along with Voltaren  gel as needed      Relevant Medications   diclofenac  (VOLTAREN ) 75 MG EC tablet   diclofenac  Sodium (VOLTAREN ) 1 % GEL   Other Relevant Orders   AMB Referral [facility] Care Management   Patient Instructions  Health Maintenance After Age 50 After age 44, you are at a higher risk for certain long-term diseases and infections as well as injuries from falls. Falls are a major cause of broken bones and head injuries in people who are older than age 19. Getting regular preventive care can help to keep you healthy and well. Preventive care includes getting regular testing and making lifestyle changes as recommended by your health care provider. Talk with your health care provider about: Which screenings and tests you should have. A screening is a test that checks for a disease when you have no symptoms. A diet and exercise plan that is right for you. What should I know about screenings and tests to prevent falls? Screening and testing are the best ways to find a health problem early. Early diagnosis and treatment give you the best chance of managing medical conditions that are common after age 80. Certain  conditions and lifestyle choices may make you more likely to have a fall. Your health care provider may recommend: Regular vision checks. Poor vision and conditions such as cataracts can make you more likely to have a fall. If you wear glasses, make sure to get your prescription updated if your vision changes. Medicine review. Work with your health care provider to regularly review all of the medicines you are taking, including over-the-counter medicines. Ask your health care provider about any side effects that may make you more likely to have a fall. Tell your health care provider if any medicines that you take make you feel dizzy or sleepy. Strength and balance checks. Your health care provider may recommend certain tests to check your strength and balance while standing, walking, or changing positions. Foot health exam. Foot pain and numbness, as well as not wearing proper footwear, can make you more likely to have a fall. Screenings, including: Osteoporosis screening. Osteoporosis is a condition that causes the bones to get weaker and break more easily. Blood pressure screening. Blood pressure changes and medicines to control blood pressure can make you feel dizzy. Depression screening. You may be more likely to have a fall if you have a fear of falling, feel depressed, or feel unable to do activities that you used to do. Alcohol use screening. Using too much alcohol can affect your balance and may make you more likely to have  a fall. Follow these instructions at home: Lifestyle Do not drink alcohol if: Your health care provider tells you not to drink. If you drink alcohol: Limit how much you have to: 0-1 drink a day for women. 0-2 drinks a day for men. Know how much alcohol is in your drink. In the [place] one drink equals one 12 oz bottle of beer (355 mL), one 5 oz glass of wine (148 mL), or one 1 oz glass of hard liquor (44 mL). Do not use any products that contain nicotine or tobacco.  These products include cigarettes, chewing tobacco, and vaping devices, such as e-cigarettes. If you need help quitting, ask your health care provider. Activity  Follow a regular exercise program to stay fit. This will help you maintain your balance. Ask your health care provider what types of exercise are appropriate for you. If you need a cane or walker, use it as recommended by your health care provider. Wear supportive shoes that have nonskid soles. Safety  Remove any tripping hazards, such as rugs, cords, and clutter. Install safety equipment such as grab bars in bathrooms and safety rails on stairs. Keep rooms and walkways well-lit. General instructions Talk with your health care provider about your risks for falling. Tell your health care provider if: You fall. Be sure to tell your health care provider about all falls, even ones that seem minor. You feel dizzy, tiredness (fatigue), or off-balance. Take over-the-counter and prescription medicines only as told by your health care provider. These include supplements. Eat a healthy diet and maintain a healthy weight. A healthy diet includes low-fat dairy products, low-fat (lean) meats, and fiber from whole grains, beans, and lots of fruits and vegetables. Stay current with your vaccines. Schedule regular health, dental, and eye exams. Summary Having a healthy lifestyle and getting preventive care can help to protect your health and wellness after age 42. Screening and testing are the best way to find a health problem early and help you avoid having a fall. Early diagnosis and treatment give you the best chance for managing medical conditions that are more common for people who are older than age 96. Falls are a major cause of broken bones and head injuries in people who are older than age 15. Take precautions to prevent a fall at home. Work with your health care provider to learn what changes you can make to improve your health and wellness  and to prevent falls. This information is not intended to replace advice given to you by your health care provider. Make sure you discuss any questions you have with your health care provider. Document Revised: 08/23/2020 Document Reviewed: 08/23/2020 Elsevier Patient Education  2024 Elsevier Inc.     Maryagnes Small, MD [facility]

## 2023-08-28 NOTE — Assessment & Plan Note (Signed)
 Was able to establish care with local pain management doctor recently. However treatment with opiates failed.  Told not much else they can do for him.  Was advised to follow-up with his PCP.

## 2023-08-28 NOTE — Assessment & Plan Note (Signed)
 Clinically euthyroid. Continue Synthroid 125 mcg daily.

## 2023-08-28 NOTE — Assessment & Plan Note (Signed)
 Active and affecting quality of life Pain management discussed Has ongoing inflammation on physical exam Patient does not like or tolerate opiates Advised to continue Tylenol  Recommend to start diclofenac  75 mg twice a day along with Voltaren  gel as needed

## 2023-08-28 NOTE — Assessment & Plan Note (Signed)
 BP Readings from Last 3 Encounters:  08/28/23 (!) 140/70  08/07/23 130/78  07/30/23 137/80  Well-controlled hypertension, off medication Prior to hospital admission patient was taking Lotensin  HCT but due to hypotension it was discontinued.  Started medication on 08/02/2023 as instructed but felt bad and did not take it again. Continues to be normotensive off medication. Advised to continue monitoring blood pressure readings at home daily for the next several days and keep a log.  Advised to contact the office if numbers persistently abnormal.

## 2023-08-28 NOTE — Patient Instructions (Signed)
 Health Maintenance After Age 76 After age 4, you are at a higher risk for certain long-term diseases and infections as well as injuries from falls. Falls are a major cause of broken bones and head injuries in people who are older than age 47. Getting regular preventive care can help to keep you healthy and well. Preventive care includes getting regular testing and making lifestyle changes as recommended by your health care provider. Talk with your health care provider about: Which screenings and tests you should have. A screening is a test that checks for a disease when you have no symptoms. A diet and exercise plan that is right for you. What should I know about screenings and tests to prevent falls? Screening and testing are the best ways to find a health problem early. Early diagnosis and treatment give you the best chance of managing medical conditions that are common after age 37. Certain conditions and lifestyle choices may make you more likely to have a fall. Your health care provider may recommend: Regular vision checks. Poor vision and conditions such as cataracts can make you more likely to have a fall. If you wear glasses, make sure to get your prescription updated if your vision changes. Medicine review. Work with your health care provider to regularly review all of the medicines you are taking, including over-the-counter medicines. Ask your health care provider about any side effects that may make you more likely to have a fall. Tell your health care provider if any medicines that you take make you feel dizzy or sleepy. Strength and balance checks. Your health care provider may recommend certain tests to check your strength and balance while standing, walking, or changing positions. Foot health exam. Foot pain and numbness, as well as not wearing proper footwear, can make you more likely to have a fall. Screenings, including: Osteoporosis screening. Osteoporosis is a condition that causes  the bones to get weaker and break more easily. Blood pressure screening. Blood pressure changes and medicines to control blood pressure can make you feel dizzy. Depression screening. You may be more likely to have a fall if you have a fear of falling, feel depressed, or feel unable to do activities that you used to do. Alcohol use screening. Using too much alcohol can affect your balance and may make you more likely to have a fall. Follow these instructions at home: Lifestyle Do not drink alcohol if: Your health care provider tells you not to drink. If you drink alcohol: Limit how much you have to: 0-1 drink a day for women. 0-2 drinks a day for men. Know how much alcohol is in your drink. In the [place] one drink equals one 12 oz bottle of beer (355 mL), one 5 oz glass of wine (148 mL), or one 1 oz glass of hard liquor (44 mL). Do not use any products that contain nicotine or tobacco. These products include cigarettes, chewing tobacco, and vaping devices, such as e-cigarettes. If you need help quitting, ask your health care provider. Activity  Follow a regular exercise program to stay fit. This will help you maintain your balance. Ask your health care provider what types of exercise are appropriate for you. If you need a cane or walker, use it as recommended by your health care provider. Wear supportive shoes that have nonskid soles. Safety  Remove any tripping hazards, such as rugs, cords, and clutter. Install safety equipment such as grab bars in bathrooms and safety rails on stairs. Keep rooms and walkways  well-lit. General instructions Talk with your health care provider about your risks for falling. Tell your health care provider if: You fall. Be sure to tell your health care provider about all falls, even ones that seem minor. You feel dizzy, tiredness (fatigue), or off-balance. Take over-the-counter and prescription medicines only as told by your health care provider. These include  supplements. Eat a healthy diet and maintain a healthy weight. A healthy diet includes low-fat dairy products, low-fat (lean) meats, and fiber from whole grains, beans, and lots of fruits and vegetables. Stay current with your vaccines. Schedule regular health, dental, and eye exams. Summary Having a healthy lifestyle and getting preventive care can help to protect your health and wellness after age 11. Screening and testing are the best way to find a health problem early and help you avoid having a fall. Early diagnosis and treatment give you the best chance for managing medical conditions that are more common for people who are older than age 28. Falls are a major cause of broken bones and head injuries in people who are older than age 48. Take precautions to prevent a fall at home. Work with your health care provider to learn what changes you can make to improve your health and wellness and to prevent falls. This information is not intended to replace advice given to you by your health care provider. Make sure you discuss any questions you have with your health care provider. Document Revised: 08/23/2020 Document Reviewed: 08/23/2020 Elsevier Patient Education  2024 [employer].

## 2023-08-28 NOTE — Assessment & Plan Note (Signed)
Well-controlled on Ambien. Has been taking Ambien for many years Recommend to continue New prescription for Ambien 10 mg sent to pharmacy of record today.

## 2023-08-29 ENCOUNTER — Telehealth: Payer: Self-pay | Admitting: *Deleted

## 2023-08-29 NOTE — Progress Notes (Unsigned)
 Complex Care Management Note Care Guide Note  08/29/2023 Name: Henry [name] MRN: [id number] DOB: 04-15-1946   Complex Care Management Outreach Attempts: An unsuccessful telephone outreach was attempted today to offer the patient information about available complex care management services.  Follow Up Plan:  Additional outreach attempts will be made to offer the patient complex care management information and services.   Encounter Outcome:  No Answer  Kandis Ormond, CMA [place]  [facility], [facility] Guide Direct Dial: [phone]  Fax: [phone] Website: [email or web address]

## 2023-08-30 NOTE — Progress Notes (Signed)
 Complex Care Management Note  Care Guide Note 08/30/2023 Name: Henry [name] MRN: [id number] DOB: 04/28/1945  Henry [name] is a 78 y.o. year old male who sees Sagardia, Isidro Margo, MD for primary care. I reached out to Henry [name] by phone today to offer complex care management services.  Henry [name] was given information about Complex Care Management services today including:   The Complex Care Management services include support from the care team which includes your Nurse Care Manager, Clinical Social Worker, or Pharmacist.  The Complex Care Management team is here to help remove barriers to the health concerns and goals most important to you. Complex Care Management services are voluntary, and the patient may decline or stop services at any time by request to their care team member.   Complex Care Management Consent Status: Patient agreed to services and verbal consent obtained.   Follow up plan:  Telephone appointment with complex care management team member scheduled for:  09/05/2023  Encounter Outcome:  Patient Scheduled  Kandis Ormond, CMA [place]  [facility], [facility] Guide Direct Dial: [phone]  Fax: [phone] Website: [email or web address]

## 2023-09-04 DIAGNOSIS — M1711 Unilateral primary osteoarthritis, right knee: Secondary | ICD-10-CM | POA: Diagnosis not present

## 2023-09-04 DIAGNOSIS — M25572 Pain in left ankle and joints of left foot: Secondary | ICD-10-CM | POA: Diagnosis not present

## 2023-09-04 DIAGNOSIS — M25571 Pain in right ankle and joints of right foot: Secondary | ICD-10-CM | POA: Diagnosis not present

## 2023-09-04 DIAGNOSIS — M170 Bilateral primary osteoarthritis of knee: Secondary | ICD-10-CM | POA: Diagnosis not present

## 2023-09-04 DIAGNOSIS — M1712 Unilateral primary osteoarthritis, left knee: Secondary | ICD-10-CM | POA: Diagnosis not present

## 2023-09-05 ENCOUNTER — Other Ambulatory Visit: Payer: Self-pay

## 2023-09-05 NOTE — Patient Outreach (Signed)
 Complex Care Management   Visit Note  09/05/2023  Name:  Henry [name] MRN: [id number] DOB: 28-Jan-1946  Situation: RNCM called and discussed Complex Care Management Program. Henry [name] reports he thought the program was specifically for home visits and declines to continue with assessment. RNCM discussed if unable to make it to provider visits, there are home based provider practice options. Patient states he is not at that point. And will continue to follow up with his providers. Henry [name] reports that if he needs care management services in the future he will call RNCM(contact number provided) and or Primary care provider.   Follow Up Plan:   No further outreach at this time.  Lindi Revering, RN, MSN, BSN, CCM [address]  [facility], Population Health Case Manager Phone: [phone]

## 2023-09-11 DIAGNOSIS — M1712 Unilateral primary osteoarthritis, left knee: Secondary | ICD-10-CM | POA: Diagnosis not present

## 2023-09-13 ENCOUNTER — Ambulatory Visit: Admitting: Emergency Medicine

## 2023-09-13 ENCOUNTER — Encounter: Payer: Self-pay | Admitting: Emergency Medicine

## 2023-09-13 VITALS — BP 114/78 | HR 85 | Temp 98.2°F | Ht 64.0 in | Wt 165.0 lb

## 2023-09-13 DIAGNOSIS — R053 Chronic cough: Secondary | ICD-10-CM | POA: Insufficient documentation

## 2023-09-13 DIAGNOSIS — J22 Unspecified acute lower respiratory infection: Secondary | ICD-10-CM | POA: Insufficient documentation

## 2023-09-13 MED ORDER — AZITHROMYCIN 250 MG PO TABS: ORAL_TABLET | ORAL | 0 refills | Status: DC

## 2023-09-13 NOTE — Patient Instructions (Signed)

## 2023-09-13 NOTE — Progress Notes (Signed)
 Henry [name] 78 y.o.   Chief Complaint  Patient presents with   Cough    Patient states this started 3 weeks ago.  Pt states fever went away, runny nose, fatigue, weakness, slight cough, he feels it in his chest. He states his mucus is white. Patient has not tested for flu or covid     HISTORY OF PRESENT ILLNESS: This is a 78 y.o. male complaining of flulike symptoms that started about 3 weeks ago. Fever gone but still having some runny nose and congestion.  Still coughing No other complaints or medical concerns today.  Cough Pertinent negatives include no chest pain, chills, fever, headaches, rash or sore throat.     Prior to Admission medications   Medication Sig Start Date End Date Taking? Authorizing Provider  acetaminophen  (TYLENOL ) 650 MG CR tablet Take 325-600 mg by mouth every 8 (eight) hours as needed for pain.   Yes [provider]  benazepril -hydrochlorthiazide (LOTENSIN  HCT) 20-25 MG tablet TAKE 1 TABLET BY MOUTH EVERY DAY 03/18/23  Yes Tawan Degroote, Isidro Margo, MD  cholecalciferol (VITAMIN D) 1000 UNITS tablet Take 1,000 Units by mouth daily.   Yes [provider]  fish oil-omega-3 fatty acids 1000 MG capsule Take 2 g by mouth daily.   Yes [provider]  ibuprofen (ADVIL) 200 MG tablet Take 200 mg by mouth every 6 (six) hours as needed for moderate pain (pain score 4-6), fever or mild pain (pain score 1-3).   Yes [provider]  levothyroxine  (SYNTHROID ) 125 MCG tablet TAKE 1 TABLET BY MOUTH EVERY DAY BEFORE BREAKFAST Patient taking differently: Take 125 mcg by mouth daily before breakfast. 03/29/22  Yes Caswell Alvillar, Isidro Margo, MD  Loperamide  HCl (IMODIUM  PO) Take 1-2 tablets by mouth as needed.   Yes [provider]  LORazepam  (ATIVAN ) 1 MG tablet TAKE 1 TABLET BY MOUTH EVERY DAY AS NEEDED FOR ANXIETY Patient taking differently: Take 1 mg by mouth 3 times/day as needed-between meals & bedtime for anxiety. 07/12/23  Yes  Zoee Heeney, Isidro Margo, MD  tamsulosin  (FLOMAX ) 0.4 MG CAPS capsule Take 0.4 mg by mouth daily after breakfast.   Yes [provider]  zolpidem  (AMBIEN ) 10 MG tablet Take 1 tablet (10 mg total) by mouth at bedtime as needed. for sleep 08/17/23  Yes Caira Poche, Isidro Margo, MD  diclofenac  (VOLTAREN ) 75 MG EC tablet Take 1 tablet (75 mg total) by mouth 2 (two) times daily. Patient not taking: Reported on 09/13/2023 08/28/23   Benigno Check Jose, MD  diclofenac  Sodium (VOLTAREN ) 1 % GEL Apply 2 g topically 4 (four) times daily. Patient not taking: Reported on 09/13/2023 08/28/23   Elvira Hammersmith, MD    Allergies  Allergen Reactions   Ciprofloxacin Itching and Rash    Patient Active Problem List   Diagnosis Date Noted   Arthralgia of multiple joints 08/28/2023   UTI (urinary tract infection) 07/28/2023   AKI (acute kidney injury) (HCC) 07/27/2023   Adjustment reaction with anxiety and depression 07/17/2023   Current moderate episode of major depressive disorder without prior episode (HCC) 09/07/2021   Iron deficiency anemia due to chronic blood loss 09/07/2016   Hypothyroidism due to acquired atrophy of thyroid  08/15/2016   Degenerative disc disease, lumbar 03/16/2014   Insomnia 03/16/2014   Chronic pain syndrome 03/16/2014   Hypothyroidism 05/02/2012   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   Essential hypertension 07/19/2007    Past Medical  History:  Diagnosis Date   Anxiety    Arthritis    Degenerative disc disease, lumbar    Hypertension    Hypothyroidism    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   [facility]AL[place]- [place] MOHS    Past Surgical History:  Procedure Laterality Date   CHOLECYSTECTOMY     COLON SURGERY  08/07/2006   colostomy & small bowel resection   FRACTURE SURGERY     left shoulder   SPINE SURGERY     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    Social History    Socioeconomic History   Marital status: Married    Spouse name: Not on file   Number of children: Not on file   Years of education: Not on file   Highest education level: Not on file  Occupational History   Not on file  Tobacco Use   Smoking status: Never    Passive exposure: Never   Smokeless tobacco: Never  Vaping Use   Vaping status: Not on file  Substance and Sexual Activity   Alcohol use: Yes   Drug use: Never   Sexual activity: Yes  Other Topics Concern   Not on file  Social History Narrative   Not on file   Social Drivers of Health   Financial Resource Strain: High Risk (09/11/2023)   Received from [facility]   Overall Financial Resource Strain (CARDIA)    Difficulty of Paying Living Expenses: Hard  Food Insecurity: Unknown (09/11/2023)   Received from [facility]   Hunger Vital Sign    Worried About Running Out of Food in the Last Year: Patient declined    Ran Out of Food in the Last Year: Never true  Transportation Needs: No Transportation Needs (09/11/2023)   Received from [facility] - Transportation    In the past 12 months, has lack of transportation kept you from medical appointments or from getting medications?: No    Lack of Transportation (Non-Medical): No  Physical Activity: Inactive (07/17/2023)   Exercise Vital Sign    Days of Exercise per Week: 0 days    Minutes of Exercise per Session: 0 min  Stress: Stress Concern Present (07/17/2023)   [employer] of Occupational Health - Occupational Stress Questionnaire    Feeling of Stress : Very much  Social Connections: Socially Integrated (07/29/2023)   Social Connection and Isolation Panel [NHANES]    Frequency of Communication with Friends and Family: More than three times a week    Frequency of Social Gatherings with Friends and Family: More than three times a week    Attends Religious Services: More than 4 times per year    Active  Member of [employer] or Organizations: Yes    Attends Banker Meetings: 1 to 4 times per year    Marital Status: Married  Catering manager Violence: Not At Risk (07/29/2023)   Humiliation, Afraid, Rape, and Kick questionnaire    Fear of Current or Ex-Partner: No    Emotionally Abused: No    Physically Abused: No    Sexually Abused: No    Family History  Problem Relation Age of Onset   Heart disease Mother        CAD and CHF     Review of Systems  Constitutional: Negative.  Negative for chills and fever.  HENT:  Positive for congestion. Negative for sore throat.   Respiratory:  Positive for cough.   Cardiovascular: Negative.  Negative for chest pain and palpitations.  Gastrointestinal:  Negative for abdominal pain, nausea and vomiting.  Genitourinary: Negative.  Negative for dysuria and hematuria.  Skin: Negative.  Negative for rash.  Neurological:  Negative for dizziness and headaches.  All other systems reviewed and are negative.   Vitals:   09/13/23 1323  BP: 114/78  Pulse: 85  Temp: 98.2 F (36.8 C)  SpO2: 98%    Physical Exam Vitals reviewed.  Constitutional:      Appearance: Normal appearance.  HENT:     Head: Normocephalic.     Right Ear: Tympanic membrane, ear canal and external ear normal.     Left Ear: Tympanic membrane, ear canal and external ear normal.     Mouth/Throat:     Mouth: Mucous membranes are moist.     Pharynx: Oropharynx is clear.  Eyes:     Extraocular Movements: Extraocular movements intact.     Conjunctiva/sclera: Conjunctivae normal.     Pupils: Pupils are equal, round, and reactive to light.  Cardiovascular:     Rate and Rhythm: Normal rate and regular rhythm.     Pulses: Normal pulses.     Heart sounds: Normal heart sounds.  Pulmonary:     Effort: Pulmonary effort is normal.     Breath sounds: Normal breath sounds.  Musculoskeletal:     Cervical back: No tenderness.  Lymphadenopathy:     Cervical: No cervical  adenopathy.  Skin:    General: Skin is warm and dry.     Capillary Refill: Capillary refill takes less than 2 seconds.  Neurological:     General: No focal deficit present.     Mental Status: He is alert and oriented to person, place, and time.  Psychiatric:        Mood and Affect: Mood normal.        Behavior: Behavior normal.      ASSESSMENT & PLAN: A total of 33 minutes was spent with the patient and counseling/coordination of care regarding preparing for this visit, review of most recent office visit notes, review of multiple chronic medical conditions and their management, diagnosis of lower respiratory infection and need for antibiotics, symptom management, review of all medications, review of most recent bloodwork results, review of health maintenance items, education on nutrition, prognosis, documentation, and need for follow up.   Problem List Items Addressed This Visit       Respiratory   Lower respiratory infection - Primary   Upper viral respiratory infection now with secondary bacterial infection.  Recommend daily azithromycin for 5 days. Advised to rest and stay well-hydrated Symptom management discussed Clinically stable.  No red flag signs or symptoms.  No findings of pneumonia. Advised to contact the office if no better or worse during the next several days.      Relevant Medications   azithromycin (ZITHROMAX) 250 MG tablet     Other   Persistent cough   Cough management discussed Recommend over-the-counter Mucinex  DM and cough drops Advised to rest and stay well-hydrated Symptom management discussed Advised to contact the office if no better or worse during the next several days.      Patient Instructions  Cough, Adult A cough helps to clear your throat and lungs. It may be a sign of an illness or another condition. A short-term (acute) cough may last 2-3 weeks. A long-term (chronic) cough may last 8 or more weeks. Many things can cause a cough. They  include: Illnesses such as: An infection  in your throat or lungs. Asthma or other heart or lung problems. Gastroesophageal reflux. This is when acid comes back up from your stomach. Breathing in things that bother (irritate) your lungs. Allergies. Postnasal drip. This is when mucus runs down the back of your throat. Smoking. Some medicines. Follow these instructions at home: Medicines Take over-the-counter and prescription medicines only as told by your doctor. Talk with your doctor before you take cough medicine (cough suppressants). Eating and drinking Do not drink alcohol. Do not drink caffeine. Drink enough fluid to keep your pee (urine) pale yellow. Lifestyle Stay away from cigarette smoke. Do not smoke or use any products that contain nicotine or tobacco. If you need help quitting, ask your doctor. Stay away from things that make you cough. These may include perfume, candles, cleaning products, or campfire smoke. General instructions  Watch for any changes to your cough. Tell your doctor about them. Always cover your mouth when you cough. If the air is dry in your home, use a cool mist vaporizer or humidifier. If your cough is worse at night, try using extra pillows to raise your head up higher while you sleep. Rest as needed. Contact a doctor if: You have new symptoms. Your symptoms get worse. You cough up pus. You have a fever that does not go away. Your cough does not get better after 2-3 weeks. Cough medicine does not help, and you are not sleeping well. You have pain that gets worse or is not helped with medicine. You are losing weight and do not know why. You have night sweats. Get help right away if: You cough up blood. You have trouble breathing. Your heart is beating very fast. These symptoms may be an emergency. Get help right away. Call 911. Do not wait to see if the symptoms will go away. Do not drive yourself to the hospital. This information is not  intended to replace advice given to you by your health care provider. Make sure you discuss any questions you have with your health care provider. Document Revised: 12/02/2021 Document Reviewed: 12/02/2021 Elsevier Patient Education  2024 Elsevier Inc.     Maryagnes Small, MD [facility]

## 2023-09-13 NOTE — Assessment & Plan Note (Signed)
 Upper viral respiratory infection now with secondary bacterial infection.  Recommend daily azithromycin for 5 days. Advised to rest and stay well-hydrated Symptom management discussed Clinically stable.  No red flag signs or symptoms.  No findings of pneumonia. Advised to contact the office if no better or worse during the next several days.

## 2023-09-13 NOTE — Assessment & Plan Note (Signed)
 Cough management discussed Recommend over-the-counter Mucinex  DM and cough drops Advised to rest and stay well-hydrated Symptom management discussed Advised to contact the office if no better or worse during the next several days.

## 2023-09-18 ENCOUNTER — Encounter: Payer: Self-pay | Admitting: Emergency Medicine

## 2023-09-18 ENCOUNTER — Ambulatory Visit (INDEPENDENT_AMBULATORY_CARE_PROVIDER_SITE_OTHER): Admitting: Emergency Medicine

## 2023-09-18 ENCOUNTER — Ambulatory Visit: Payer: Self-pay | Admitting: Emergency Medicine

## 2023-09-18 VITALS — BP 128/74 | HR 90 | Temp 97.6°F | Ht 64.0 in | Wt 167.4 lb

## 2023-09-18 DIAGNOSIS — R972 Elevated prostate specific antigen [PSA]: Secondary | ICD-10-CM

## 2023-09-18 DIAGNOSIS — R5383 Other fatigue: Secondary | ICD-10-CM | POA: Diagnosis not present

## 2023-09-18 DIAGNOSIS — E039 Hypothyroidism, unspecified: Secondary | ICD-10-CM

## 2023-09-18 DIAGNOSIS — N400 Enlarged prostate without lower urinary tract symptoms: Secondary | ICD-10-CM | POA: Diagnosis not present

## 2023-09-18 DIAGNOSIS — I10 Essential (primary) hypertension: Secondary | ICD-10-CM | POA: Diagnosis not present

## 2023-09-18 DIAGNOSIS — F4323 Adjustment disorder with mixed anxiety and depressed mood: Secondary | ICD-10-CM

## 2023-09-18 DIAGNOSIS — Z125 Encounter for screening for malignant neoplasm of prostate: Secondary | ICD-10-CM | POA: Diagnosis not present

## 2023-09-18 DIAGNOSIS — D500 Iron deficiency anemia secondary to blood loss (chronic): Secondary | ICD-10-CM | POA: Diagnosis not present

## 2023-09-18 DIAGNOSIS — M2550 Pain in unspecified joint: Secondary | ICD-10-CM | POA: Diagnosis not present

## 2023-09-18 LAB — COMPREHENSIVE METABOLIC PANEL WITH GFR
ALT: 11 U/L (ref 0–53)
AST: 10 U/L (ref 0–37)
Albumin: 4 g/dL (ref 3.5–5.2)
Alkaline Phosphatase: 72 U/L (ref 39–117)
BUN: 18 mg/dL (ref 6–23)
CO2: 25 meq/L (ref 19–32)
Calcium: 9.7 mg/dL (ref 8.4–10.5)
Chloride: 100 meq/L (ref 96–112)
Creatinine, Ser: 1.02 mg/dL (ref 0.40–1.50)
GFR: 71.45 mL/min (ref >60.00)
Glucose, Bld: 95 mg/dL (ref 70–99)
Potassium: 3.9 meq/L (ref 3.5–5.1)
Sodium: 135 meq/L (ref 135–145)
Total Bilirubin: 1.3 mg/dL — ABNORMAL HIGH (ref 0.2–1.2)
Total Protein: 6.6 g/dL (ref 6.0–8.3)

## 2023-09-18 LAB — CBC WITH DIFFERENTIAL/PLATELET
Basophils Absolute: 0.1 10*3/uL (ref 0.0–0.1)
Basophils Relative: 0.7 % (ref 0.0–3.0)
Eosinophils Absolute: 0.1 10*3/uL (ref 0.0–0.7)
Eosinophils Relative: 1.2 % (ref 0.0–5.0)
HCT: 41.2 % (ref 39.0–52.0)
Hemoglobin: 13.6 g/dL (ref 13.0–17.0)
Lymphocytes Relative: 17.2 % (ref 12.0–46.0)
Lymphs Abs: 1.6 10*3/uL (ref 0.7–4.0)
MCHC: 33 g/dL (ref 30.0–36.0)
MCV: 82 fl (ref 78.0–100.0)
Monocytes Absolute: 0.8 10*3/uL (ref 0.1–1.0)
Monocytes Relative: 9.2 % (ref 3.0–12.0)
Neutro Abs: 6.6 10*3/uL (ref 1.4–7.7)
Neutrophils Relative %: 71.7 % (ref 43.0–77.0)
Platelets: 366 10*3/uL (ref 150.0–400.0)
RBC: 5.03 Mil/uL (ref 4.22–5.81)
RDW: 16 % — ABNORMAL HIGH (ref 11.5–15.5)
WBC: 9.2 10*3/uL (ref 4.0–10.5)

## 2023-09-18 LAB — FERRITIN: Ferritin: 98.3 ng/mL (ref 22.0–322.0)

## 2023-09-18 LAB — PSA: PSA: 4.19 ng/mL — ABNORMAL HIGH (ref 0.10–4.00)

## 2023-09-18 LAB — IRON, TOTAL/TOTAL IRON BINDING CAP
%SAT: 26 % (ref 20–48)
Iron: 74 ug/dL (ref 50–180)
TIBC: 289 ug/dL (ref 250–425)

## 2023-09-18 LAB — FOLATE: Folate: 9.6 ng/mL (ref >5.9)

## 2023-09-18 LAB — TSH: TSH: 1.79 u[IU]/mL (ref 0.35–5.50)

## 2023-09-18 LAB — VITAMIN B12: Vitamin B-12: >1500 pg/mL — ABNORMAL HIGH (ref 211–911)

## 2023-09-18 NOTE — Assessment & Plan Note (Signed)
 Active and affecting quality of life Pain management discussed Has ongoing inflammation on physical exam Patient does not like or tolerate opiates Advised to continue Tylenol  Recommend to start diclofenac  75 mg twice a day along with Voltaren  gel as needed

## 2023-09-18 NOTE — Assessment & Plan Note (Signed)
 BP Readings from Last 3 Encounters:  09/18/23 128/74  09/13/23 114/78  08/28/23 (!) 140/70  Well-controlled hypertension, off medication Prior to hospital admission patient was taking Lotensin  HCT but due to hypotension it was discontinued.  Started medication on 08/02/2023 as instructed but felt bad and did not take it again. Continues to be normotensive off medication. Advised to continue monitoring blood pressure readings at home daily for the next several days and keep a log.  Advised to contact the office if numbers persistently abnormal.

## 2023-09-18 NOTE — Patient Instructions (Signed)
 Health Maintenance After Age 76 After age 4, you are at a higher risk for certain long-term diseases and infections as well as injuries from falls. Falls are a major cause of broken bones and head injuries in people who are older than age 47. Getting regular preventive care can help to keep you healthy and well. Preventive care includes getting regular testing and making lifestyle changes as recommended by your health care provider. Talk with your health care provider about: Which screenings and tests you should have. A screening is a test that checks for a disease when you have no symptoms. A diet and exercise plan that is right for you. What should I know about screenings and tests to prevent falls? Screening and testing are the best ways to find a health problem early. Early diagnosis and treatment give you the best chance of managing medical conditions that are common after age 37. Certain conditions and lifestyle choices may make you more likely to have a fall. Your health care provider may recommend: Regular vision checks. Poor vision and conditions such as cataracts can make you more likely to have a fall. If you wear glasses, make sure to get your prescription updated if your vision changes. Medicine review. Work with your health care provider to regularly review all of the medicines you are taking, including over-the-counter medicines. Ask your health care provider about any side effects that may make you more likely to have a fall. Tell your health care provider if any medicines that you take make you feel dizzy or sleepy. Strength and balance checks. Your health care provider may recommend certain tests to check your strength and balance while standing, walking, or changing positions. Foot health exam. Foot pain and numbness, as well as not wearing proper footwear, can make you more likely to have a fall. Screenings, including: Osteoporosis screening. Osteoporosis is a condition that causes  the bones to get weaker and break more easily. Blood pressure screening. Blood pressure changes and medicines to control blood pressure can make you feel dizzy. Depression screening. You may be more likely to have a fall if you have a fear of falling, feel depressed, or feel unable to do activities that you used to do. Alcohol use screening. Using too much alcohol can affect your balance and may make you more likely to have a fall. Follow these instructions at home: Lifestyle Do not drink alcohol if: Your health care provider tells you not to drink. If you drink alcohol: Limit how much you have to: 0-1 drink a day for women. 0-2 drinks a day for men. Know how much alcohol is in your drink. In the [place] one drink equals one 12 oz bottle of beer (355 mL), one 5 oz glass of wine (148 mL), or one 1 oz glass of hard liquor (44 mL). Do not use any products that contain nicotine or tobacco. These products include cigarettes, chewing tobacco, and vaping devices, such as e-cigarettes. If you need help quitting, ask your health care provider. Activity  Follow a regular exercise program to stay fit. This will help you maintain your balance. Ask your health care provider what types of exercise are appropriate for you. If you need a cane or walker, use it as recommended by your health care provider. Wear supportive shoes that have nonskid soles. Safety  Remove any tripping hazards, such as rugs, cords, and clutter. Install safety equipment such as grab bars in bathrooms and safety rails on stairs. Keep rooms and walkways  well-lit. General instructions Talk with your health care provider about your risks for falling. Tell your health care provider if: You fall. Be sure to tell your health care provider about all falls, even ones that seem minor. You feel dizzy, tiredness (fatigue), or off-balance. Take over-the-counter and prescription medicines only as told by your health care provider. These include  supplements. Eat a healthy diet and maintain a healthy weight. A healthy diet includes low-fat dairy products, low-fat (lean) meats, and fiber from whole grains, beans, and lots of fruits and vegetables. Stay current with your vaccines. Schedule regular health, dental, and eye exams. Summary Having a healthy lifestyle and getting preventive care can help to protect your health and wellness after age 11. Screening and testing are the best way to find a health problem early and help you avoid having a fall. Early diagnosis and treatment give you the best chance for managing medical conditions that are more common for people who are older than age 28. Falls are a major cause of broken bones and head injuries in people who are older than age 48. Take precautions to prevent a fall at home. Work with your health care provider to learn what changes you can make to improve your health and wellness and to prevent falls. This information is not intended to replace advice given to you by your health care provider. Make sure you discuss any questions you have with your health care provider. Document Revised: 08/23/2020 Document Reviewed: 08/23/2020 Elsevier Patient Education  2024 [employer].

## 2023-09-18 NOTE — Assessment & Plan Note (Signed)
 CBC done today along with iron and ferritin levels Clinically stable.  No red flag signs or symptoms May need hematology evaluation and possible iron infusion therapy

## 2023-09-18 NOTE — Assessment & Plan Note (Addendum)
 Lab Results  Component Value Date   TSH 0.61 10/31/2022  Clinically euthyroid. Continue Synthroid  125 mcg daily  TSH repeated today

## 2023-09-18 NOTE — Assessment & Plan Note (Signed)
 Chronic anxiety and depression affecting quality of life Recommend to start BuSpar 7.5 mg twice a day May benefit from behavioral health evaluation

## 2023-09-18 NOTE — Assessment & Plan Note (Signed)
 Multifactorial.  Clinical stable however.  No red flag signs or symptoms. Differential diagnosis discussed. Has history of iron deficiency anemia We will check levels today May need hematology evaluation and possible iron infusion therapy. ED precautions given Advised to contact the office if no better or worse during the next several days.

## 2023-09-18 NOTE — Progress Notes (Signed)
 Henry [name] 78 y.o.   Chief Complaint  Patient presents with   food poising    Has been dealing with food poisoning for 2 months. Has visited the hospital in the past and Dr.Higinio Grow is aware of issue. Ongoing inflammation in joints, weight loss, fatigue. Noted that past set of antibiotics did help a bit    HISTORY OF PRESENT ILLNESS: This is a 78 y.o. male complaining of feeling tired and fatigue Has history of iron deficiency anemia.  Concerned about low iron levels. Had trouble in the past with oral intake of iron Good appetite but losing some weight. Recent chest x-ray normal.  Recent CT abdomen and pelvis unremarkable Recent PSA normal Good appetite but losing weight.  Denies diarrhea. Seen by me last 09/13/2023 with lower respiratory infection.  Took full course of azithromycin . Last April had bout of Salmonella gastroenteritis for which he was admitted to the hospital.  No other complaints or medical concerns today. Wt Readings from Last 3 Encounters:  09/13/23 165 lb (74.8 kg)  08/28/23 173 lb (78.5 kg)  08/07/23 177 lb (80.3 kg)     HPI   Prior to Admission medications   Medication Sig Start Date End Date Taking? Authorizing Provider  acetaminophen  (TYLENOL ) 650 MG CR tablet Take 325-600 mg by mouth every 8 (eight) hours as needed for pain.   Yes [provider]  azithromycin  (ZITHROMAX ) 250 MG tablet Sig as indicated 09/13/23  Yes Dontarius Sheley, Isidro Margo, MD  benazepril -hydrochlorthiazide (LOTENSIN  HCT) 20-25 MG tablet TAKE 1 TABLET BY MOUTH EVERY DAY 03/18/23  Yes Azizah Lisle Jose, MD  cholecalciferol (VITAMIN D) 1000 UNITS tablet Take 1,000 Units by mouth daily.   Yes [provider]  diclofenac  (VOLTAREN ) 75 MG EC tablet Take 1 tablet (75 mg total) by mouth 2 (two) times daily. Patient not taking: Reported on 09/18/2023 08/28/23   Dorotha Hirschi Jose, MD  diclofenac  Sodium (VOLTAREN ) 1 % GEL Apply 2 g topically 4 (four) times daily. Patient  not taking: Reported on 09/18/2023 08/28/23   Analyn Matusek Jose, MD  fish oil-omega-3 fatty acids 1000 MG capsule Take 2 g by mouth daily.   Yes [provider]  ibuprofen (ADVIL) 200 MG tablet Take 200 mg by mouth every 6 (six) hours as needed for moderate pain (pain score 4-6), fever or mild pain (pain score 1-3).   Yes [provider]  levothyroxine  (SYNTHROID ) 125 MCG tablet TAKE 1 TABLET BY MOUTH EVERY DAY BEFORE BREAKFAST Patient taking differently: Take 125 mcg by mouth daily before breakfast. 03/29/22  Yes Abena Erdman, Isidro Margo, MD  Loperamide  HCl (IMODIUM  PO) Take 1-2 tablets by mouth as needed.   Yes [provider]  LORazepam  (ATIVAN ) 1 MG tablet TAKE 1 TABLET BY MOUTH EVERY DAY AS NEEDED FOR ANXIETY Patient taking differently: Take 1 mg by mouth 3 times/day as needed-between meals & bedtime for anxiety. 07/12/23  Yes Anavey Coombes, Isidro Margo, MD  tamsulosin  (FLOMAX ) 0.4 MG CAPS capsule Take 0.4 mg by mouth daily after breakfast.   Yes [provider]  zolpidem  (AMBIEN ) 10 MG tablet Take 1 tablet (10 mg total) by mouth at bedtime as needed. for sleep 08/17/23  Yes Elvira Hammersmith, MD    Allergies  Allergen Reactions   Ciprofloxacin Itching and Rash    Patient Active Problem List   Diagnosis Date Noted   Lower respiratory infection 09/13/2023   Persistent cough 09/13/2023   Arthralgia of multiple joints 08/28/2023   UTI (urinary tract infection)  07/28/2023   AKI (acute kidney injury) (HCC) 07/27/2023   Adjustment reaction with anxiety and depression 07/17/2023   Current moderate episode of major depressive disorder without prior episode (HCC) 09/07/2021   Iron deficiency anemia due to chronic blood loss 09/07/2016   Hypothyroidism due to acquired atrophy of thyroid  08/15/2016   Degenerative disc disease, lumbar 03/16/2014   Insomnia 03/16/2014   Chronic pain syndrome 03/16/2014   Hypothyroidism 05/02/2012   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   Essential hypertension 07/19/2007    Past Medical History:  Diagnosis Date   Anxiety    Arthritis    Degenerative disc disease, lumbar    Hypertension    Hypothyroidism    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   [facility]AL[place]- [place] MOHS    Past Surgical History:  Procedure Laterality Date   CHOLECYSTECTOMY     COLON SURGERY  08/07/2006   colostomy & small bowel resection   FRACTURE SURGERY     left shoulder   SPINE SURGERY     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    Social History   Socioeconomic History   Marital status: Married    Spouse name: Not on file   Number of children: Not on file   Years of education: Not on file   Highest education level: Not on file  Occupational History   Not on file  Tobacco Use   Smoking status: Never    Passive exposure: Never   Smokeless tobacco: Never  Vaping Use   Vaping status: Not on file  Substance and Sexual Activity   Alcohol use: Yes   Drug use: Never   Sexual activity: Yes  Other Topics Concern   Not on file  Social History Narrative   Not on file   Social Drivers of Health   Financial Resource Strain: High Risk (09/11/2023)   Received from [facility]   Overall Financial Resource Strain (CARDIA)    Difficulty of Paying Living Expenses: Hard  Food Insecurity: Unknown (09/11/2023)   Received from [facility]   Hunger Vital Sign    Worried About Running Out of Food in the Last Year: Patient declined    Ran Out of Food in the Last Year: Never true  Transportation Needs: No Transportation Needs (09/11/2023)   Received from [facility]   PRAPARE - Transportation    In the past 12 months, has lack of transportation kept you from medical appointments or from getting medications?: No    Lack of Transportation (Non-Medical): No  Physical Activity: Inactive (07/17/2023)    Exercise Vital Sign    Days of Exercise per Week: 0 days    Minutes of Exercise per Session: 0 min  Stress: Stress Concern Present (07/17/2023)   [employer] of Occupational Health - Occupational Stress Questionnaire    Feeling of Stress : Very much  Social Connections: Socially Integrated (07/29/2023)   Social Connection and Isolation Panel [NHANES]    Frequency of Communication with Friends and Family: More than three times a week    Frequency of Social Gatherings with Friends and Family: More than three times a week    Attends Religious Services: More than 4 times per year    Active Member of [employer] or Organizations: Yes    Attends Banker Meetings: 1 to 4 times per year    Marital Status: Married  Catering manager Violence:  Not At Risk (07/29/2023)   Humiliation, Afraid, Rape, and Kick questionnaire    Fear of Current or Ex-Partner: No    Emotionally Abused: No    Physically Abused: No    Sexually Abused: No    Family History  Problem Relation Age of Onset   Heart disease Mother        CAD and CHF     Review of Systems  Constitutional:  Positive for malaise/fatigue and weight loss.  HENT: Negative.  Negative for congestion and sore throat.   Respiratory: Negative.  Negative for cough and shortness of breath.   Cardiovascular: Negative.  Negative for chest pain and palpitations.  Gastrointestinal:  Negative for abdominal pain, blood in stool, diarrhea, melena, nausea and vomiting.  Genitourinary: Negative.  Negative for dysuria and hematuria.  Musculoskeletal:  Positive for joint pain.  Skin: Negative.  Negative for rash.  Neurological: Negative.  Negative for dizziness and headaches.    Today's Vitals   09/18/23 0812  BP: 128/74  Pulse: 90  Temp: 97.6 F (36.4 C)  SpO2: 98%  Weight: 167 lb 6.4 oz (75.9 kg)  Height: 5\' 4"  (1.626 m)   Body mass index is 28.73 kg/m.   Physical Exam Constitutional:      Appearance: Normal appearance.  HENT:      Head: Normocephalic.     Mouth/Throat:     Mouth: Mucous membranes are moist.     Pharynx: Oropharynx is clear.  Eyes:     Extraocular Movements: Extraocular movements intact.     Pupils: Pupils are equal, round, and reactive to light.  Cardiovascular:     Rate and Rhythm: Normal rate and regular rhythm.     Pulses: Normal pulses.     Heart sounds: Normal heart sounds.  Pulmonary:     Effort: Pulmonary effort is normal.     Breath sounds: Normal breath sounds.  Abdominal:     Palpations: Abdomen is soft.     Tenderness: There is no abdominal tenderness.     Hernia: A hernia is present.  Musculoskeletal:     Cervical back: No tenderness.  Lymphadenopathy:     Cervical: No cervical adenopathy.  Skin:    General: Skin is warm and dry.     Capillary Refill: Capillary refill takes less than 2 seconds.  Neurological:     General: No focal deficit present.     Mental Status: He is alert and oriented to person, place, and time.  Psychiatric:        Mood and Affect: Mood normal.        Behavior: Behavior normal.      ASSESSMENT & PLAN: A total of 45 minutes was spent with the patient and counseling/coordination of care regarding preparing for this visit, review of most recent office visit notes, review of multiple chronic medical conditions and their management, differential diagnosis of tiredness and need for workup, review of all medications, review of most recent bloodwork results, review of health maintenance items, education on nutrition, prognosis, documentation, and need for follow up.   Problem List Items Addressed This Visit       Cardiovascular and Mediastinum   Essential hypertension   BP Readings from Last 3 Encounters:  09/18/23 128/74  09/13/23 114/78  08/28/23 (!) 140/70  Well-controlled hypertension, off medication Prior to hospital admission patient was taking Lotensin  HCT but due to hypotension it was discontinued.  Started medication on 08/02/2023 as  instructed but felt bad and did not take  it again. Continues to be normotensive off medication. Advised to continue monitoring blood pressure readings at home daily for the next several days and keep a log.  Advised to contact the office if numbers persistently abnormal.       Relevant Orders   Comprehensive metabolic panel with GFR     Endocrine   Hypothyroidism   Lab Results  Component Value Date   TSH 0.61 10/31/2022  Clinically euthyroid. Continue Synthroid  125 mcg daily  TSH repeated today       Relevant Orders   TSH     Other   Iron deficiency anemia due to chronic blood loss   CBC done today along with iron and ferritin levels Clinically stable.  No red flag signs or symptoms May need hematology evaluation and possible iron infusion therapy      Relevant Orders   CBC with Differential/Platelet   Vitamin B12   Folate   Iron and TIBC   Ferritin   Adjustment reaction with anxiety and depression   Chronic anxiety and depression affecting quality of life Recommend to start BuSpar  7.5 mg twice a day May benefit from behavioral health evaluation      Arthralgia of multiple joints   Active and affecting quality of life Pain management discussed Has ongoing inflammation on physical exam Patient does not like or tolerate opiates Advised to continue Tylenol  Recommend to start diclofenac  75 mg twice a day along with Voltaren  gel as needed      Tiredness - Primary   Multifactorial.  Clinical stable however.  No red flag signs or symptoms. Differential diagnosis discussed. Has history of iron deficiency anemia We will check levels today May need hematology evaluation and possible iron infusion therapy. ED precautions given Advised to contact the office if no better or worse during the next several days.      Relevant Orders   Gastrointestinal Panel by PCR , Stool   Comprehensive metabolic panel with GFR   CBC with Differential/Platelet   Enlarged prostate    Relevant Orders   PSA   Other Visit Diagnoses       Screening for prostate cancer       Relevant Orders   PSA      Patient Instructions  Health Maintenance After Age 15 After age 70, you are at a higher risk for certain long-term diseases and infections as well as injuries from falls. Falls are a major cause of broken bones and head injuries in people who are older than age 56. Getting regular preventive care can help to keep you healthy and well. Preventive care includes getting regular testing and making lifestyle changes as recommended by your health care provider. Talk with your health care provider about: Which screenings and tests you should have. A screening is a test that checks for a disease when you have no symptoms. A diet and exercise plan that is right for you. What should I know about screenings and tests to prevent falls? Screening and testing are the best ways to find a health problem early. Early diagnosis and treatment give you the best chance of managing medical conditions that are common after age 85. Certain conditions and lifestyle choices may make you more likely to have a fall. Your health care provider may recommend: Regular vision checks. Poor vision and conditions such as cataracts can make you more likely to have a fall. If you wear glasses, make sure to get your prescription updated if your vision changes. Medicine review.  Work with your health care provider to regularly review all of the medicines you are taking, including over-the-counter medicines. Ask your health care provider about any side effects that may make you more likely to have a fall. Tell your health care provider if any medicines that you take make you feel dizzy or sleepy. Strength and balance checks. Your health care provider may recommend certain tests to check your strength and balance while standing, walking, or changing positions. Foot health exam. Foot pain and numbness, as well as not wearing  proper footwear, can make you more likely to have a fall. Screenings, including: Osteoporosis screening. Osteoporosis is a condition that causes the bones to get weaker and break more easily. Blood pressure screening. Blood pressure changes and medicines to control blood pressure can make you feel dizzy. Depression screening. You may be more likely to have a fall if you have a fear of falling, feel depressed, or feel unable to do activities that you used to do. Alcohol use screening. Using too much alcohol can affect your balance and may make you more likely to have a fall. Follow these instructions at home: Lifestyle Do not drink alcohol if: Your health care provider tells you not to drink. If you drink alcohol: Limit how much you have to: 0-1 drink a day for women. 0-2 drinks a day for men. Know how much alcohol is in your drink. In the [place] one drink equals one 12 oz bottle of beer (355 mL), one 5 oz glass of wine (148 mL), or one 1 oz glass of hard liquor (44 mL). Do not use any products that contain nicotine or tobacco. These products include cigarettes, chewing tobacco, and vaping devices, such as e-cigarettes. If you need help quitting, ask your health care provider. Activity  Follow a regular exercise program to stay fit. This will help you maintain your balance. Ask your health care provider what types of exercise are appropriate for you. If you need a cane or walker, use it as recommended by your health care provider. Wear supportive shoes that have nonskid soles. Safety  Remove any tripping hazards, such as rugs, cords, and clutter. Install safety equipment such as grab bars in bathrooms and safety rails on stairs. Keep rooms and walkways well-lit. General instructions Talk with your health care provider about your risks for falling. Tell your health care provider if: You fall. Be sure to tell your health care provider about all falls, even ones that seem minor. You feel  dizzy, tiredness (fatigue), or off-balance. Take over-the-counter and prescription medicines only as told by your health care provider. These include supplements. Eat a healthy diet and maintain a healthy weight. A healthy diet includes low-fat dairy products, low-fat (lean) meats, and fiber from whole grains, beans, and lots of fruits and vegetables. Stay current with your vaccines. Schedule regular health, dental, and eye exams. Summary Having a healthy lifestyle and getting preventive care can help to protect your health and wellness after age 40. Screening and testing are the best way to find a health problem early and help you avoid having a fall. Early diagnosis and treatment give you the best chance for managing medical conditions that are more common for people who are older than age 44. Falls are a major cause of broken bones and head injuries in people who are older than age 36. Take precautions to prevent a fall at home. Work with your health care provider to learn what changes you can make to improve  your health and wellness and to prevent falls. This information is not intended to replace advice given to you by your health care provider. Make sure you discuss any questions you have with your health care provider. Document Revised: 08/23/2020 Document Reviewed: 08/23/2020 Elsevier Patient Education  2024 Elsevier Inc.    Maryagnes Small, MD [facility]

## 2023-09-19 ENCOUNTER — Other Ambulatory Visit: Payer: Self-pay | Admitting: Emergency Medicine

## 2023-09-19 DIAGNOSIS — A020 Salmonella enteritis: Secondary | ICD-10-CM

## 2023-09-19 LAB — GI PROFILE, STOOL, PCR
Adenovirus F 40/41: NOT DETECTED
Astrovirus: NOT DETECTED
C difficile toxin A/B: DETECTED — AB
Campylobacter: NOT DETECTED
Cryptosporidium: NOT DETECTED
Cyclospora cayetanensis: NOT DETECTED
Entamoeba histolytica: NOT DETECTED
Enteroaggregative E coli: NOT DETECTED
Enteropathogenic E coli: NOT DETECTED
Enterotoxigenic E coli: NOT DETECTED
Giardia lamblia: NOT DETECTED
Norovirus GI/GII: DETECTED — AB
Plesiomonas shigelloides: NOT DETECTED
Rotavirus A: NOT DETECTED
Salmonella: DETECTED — AB
Sapovirus: NOT DETECTED
Shiga-toxin-producing E coli: NOT DETECTED
Shigella/Enteroinvasive E coli: NOT DETECTED
Vibrio cholerae: NOT DETECTED
Vibrio: NOT DETECTED
Yersinia enterocolitica: NOT DETECTED

## 2023-09-27 ENCOUNTER — Ambulatory Visit: Admitting: Internal Medicine

## 2023-10-01 DIAGNOSIS — M25562 Pain in left knee: Secondary | ICD-10-CM | POA: Diagnosis not present

## 2023-10-01 DIAGNOSIS — M25561 Pain in right knee: Secondary | ICD-10-CM | POA: Diagnosis not present

## 2023-10-04 ENCOUNTER — Ambulatory Visit: Admitting: Internal Medicine

## 2023-10-11 ENCOUNTER — Ambulatory Visit: Admitting: Internal Medicine

## 2023-11-06 ENCOUNTER — Ambulatory Visit: Payer: Medicare HMO | Admitting: Emergency Medicine

## 2023-12-04 DIAGNOSIS — M1712 Unilateral primary osteoarthritis, left knee: Secondary | ICD-10-CM | POA: Diagnosis not present

## 2023-12-05 ENCOUNTER — Ambulatory Visit: Payer: Self-pay | Admitting: Emergency Medicine

## 2023-12-05 ENCOUNTER — Encounter: Payer: Self-pay | Admitting: Emergency Medicine

## 2023-12-05 ENCOUNTER — Other Ambulatory Visit: Payer: Self-pay | Admitting: Emergency Medicine

## 2023-12-05 ENCOUNTER — Ambulatory Visit (INDEPENDENT_AMBULATORY_CARE_PROVIDER_SITE_OTHER): Admitting: Emergency Medicine

## 2023-12-05 VITALS — BP 122/82 | HR 77 | Temp 97.5°F | Ht 64.0 in | Wt 176.0 lb

## 2023-12-05 DIAGNOSIS — N400 Enlarged prostate without lower urinary tract symptoms: Secondary | ICD-10-CM

## 2023-12-05 DIAGNOSIS — F5104 Psychophysiologic insomnia: Secondary | ICD-10-CM

## 2023-12-05 DIAGNOSIS — Z13228 Encounter for screening for other metabolic disorders: Secondary | ICD-10-CM

## 2023-12-05 DIAGNOSIS — Z130 Encounter for screening for diseases of the blood and blood-forming organs and certain disorders involving the immune mechanism: Secondary | ICD-10-CM | POA: Diagnosis not present

## 2023-12-05 DIAGNOSIS — Z13220 Encounter for screening for lipoid disorders: Secondary | ICD-10-CM | POA: Diagnosis not present

## 2023-12-05 DIAGNOSIS — I10 Essential (primary) hypertension: Secondary | ICD-10-CM

## 2023-12-05 DIAGNOSIS — Z0001 Encounter for general adult medical examination with abnormal findings: Secondary | ICD-10-CM

## 2023-12-05 DIAGNOSIS — Z1329 Encounter for screening for other suspected endocrine disorder: Secondary | ICD-10-CM

## 2023-12-05 DIAGNOSIS — F321 Major depressive disorder, single episode, moderate: Secondary | ICD-10-CM

## 2023-12-05 DIAGNOSIS — H90A21 Sensorineural hearing loss, unilateral, right ear, with restricted hearing on the contralateral side: Secondary | ICD-10-CM | POA: Diagnosis not present

## 2023-12-05 DIAGNOSIS — E039 Hypothyroidism, unspecified: Secondary | ICD-10-CM

## 2023-12-05 DIAGNOSIS — E034 Atrophy of thyroid (acquired): Secondary | ICD-10-CM

## 2023-12-05 DIAGNOSIS — M2550 Pain in unspecified joint: Secondary | ICD-10-CM | POA: Diagnosis not present

## 2023-12-05 LAB — LIPID PANEL
Cholesterol: 219 mg/dL — ABNORMAL HIGH (ref 0–200)
HDL: 51.6 mg/dL (ref >39.00)
LDL Cholesterol: 154 mg/dL — ABNORMAL HIGH (ref 0–99)
NonHDL: 167.14
Total CHOL/HDL Ratio: 4
Triglycerides: 64 mg/dL (ref 0.0–149.0)
VLDL: 12.8 mg/dL (ref 0.0–40.0)

## 2023-12-05 LAB — COMPREHENSIVE METABOLIC PANEL WITH GFR
ALT: 9 U/L (ref 0–53)
AST: 10 U/L (ref 0–37)
Albumin: 4.5 g/dL (ref 3.5–5.2)
Alkaline Phosphatase: 55 U/L (ref 39–117)
BUN: 20 mg/dL (ref 6–23)
CO2: 23 meq/L (ref 19–32)
Calcium: 9.7 mg/dL (ref 8.4–10.5)
Chloride: 102 meq/L (ref 96–112)
Creatinine, Ser: 1.11 mg/dL (ref 0.40–1.50)
GFR: 64.46 mL/min (ref >60.00)
Glucose, Bld: 105 mg/dL — ABNORMAL HIGH (ref 70–99)
Potassium: 3.9 meq/L (ref 3.5–5.1)
Sodium: 136 meq/L (ref 135–145)
Total Bilirubin: 1.3 mg/dL — ABNORMAL HIGH (ref 0.2–1.2)
Total Protein: 7.2 g/dL (ref 6.0–8.3)

## 2023-12-05 LAB — CBC WITH DIFFERENTIAL/PLATELET
Basophils Absolute: 0 K/uL (ref 0.0–0.1)
Basophils Relative: 0.1 % (ref 0.0–3.0)
Eosinophils Absolute: 0 K/uL (ref 0.0–0.7)
Eosinophils Relative: 0 % (ref 0.0–5.0)
HCT: 45.8 % (ref 39.0–52.0)
Hemoglobin: 14.9 g/dL (ref 13.0–17.0)
Lymphocytes Relative: 6.4 % — ABNORMAL LOW (ref 12.0–46.0)
Lymphs Abs: 0.8 K/uL (ref 0.7–4.0)
MCHC: 32.5 g/dL (ref 30.0–36.0)
MCV: 85.3 fl (ref 78.0–100.0)
Monocytes Absolute: 0.9 K/uL (ref 0.1–1.0)
Monocytes Relative: 7.3 % (ref 3.0–12.0)
Neutro Abs: 11.1 K/uL — ABNORMAL HIGH (ref 1.4–7.7)
Neutrophils Relative %: 86.2 % — ABNORMAL HIGH (ref 43.0–77.0)
Platelets: 292 K/uL (ref 150.0–400.0)
RBC: 5.37 Mil/uL (ref 4.22–5.81)
RDW: 15 % (ref 11.5–15.5)
WBC: 12.9 K/uL — ABNORMAL HIGH (ref 4.0–10.5)

## 2023-12-05 LAB — PSA: PSA: 2.91 ng/mL (ref 0.10–4.00)

## 2023-12-05 LAB — HEMOGLOBIN A1C: Hgb A1c MFr Bld: 5.7 % (ref 4.6–6.5)

## 2023-12-05 LAB — TSH: TSH: 21.52 u[IU]/mL — ABNORMAL HIGH (ref 0.35–5.50)

## 2023-12-05 MED ORDER — LEVOTHYROXINE SODIUM 125 MCG PO TABS: 125.0000 ug | ORAL_TABLET | Freq: Every day | ORAL | 3 refills | Status: AC

## 2023-12-05 MED ORDER — ROSUVASTATIN CALCIUM 10 MG PO TABS: 10.0000 mg | ORAL_TABLET | Freq: Every day | ORAL | 3 refills | Status: AC

## 2023-12-05 NOTE — Assessment & Plan Note (Signed)
 Well-controlled. Pain management discussed

## 2023-12-05 NOTE — Assessment & Plan Note (Signed)
 Well-controlled symptoms on tamsulosin  0.4 mg daily

## 2023-12-05 NOTE — Patient Instructions (Signed)
 Health Maintenance, Male  Adopting a healthy lifestyle and getting preventive care are important in promoting health and wellness. Ask your health care provider about:  The right schedule for you to have regular tests and exams.  Things you can do on your own to prevent diseases and keep yourself healthy.  What should I know about diet, weight, and exercise?  Eat a healthy diet    Eat a diet that includes plenty of vegetables, fruits, low-fat dairy products, and lean protein.  Do not eat a lot of foods that are high in solid fats, added sugars, or sodium.  Maintain a healthy weight  Body mass index (BMI) is a measurement that can be used to identify possible weight problems. It estimates body fat based on height and weight. Your health care provider can help determine your BMI and help you achieve or maintain a healthy weight.  Get regular exercise  Get regular exercise. This is one of the most important things you can do for your health. Most adults should:  Exercise for at least 150 minutes each week. The exercise should increase your heart rate and make you sweat (moderate-intensity exercise).  Do strengthening exercises at least twice a week. This is in addition to the moderate-intensity exercise.  Spend less time sitting. Even light physical activity can be beneficial.  Watch cholesterol and blood lipids  Have your blood tested for lipids and cholesterol at 76 years of age, then have this test every 5 years.  You may need to have your cholesterol levels checked more often if:  Your lipid or cholesterol levels are high.  You are older than 76 years of age.  You are at high risk for heart disease.  What should I know about cancer screening?  Many types of cancers can be detected early and may often be prevented. Depending on your health history and family history, you may need to have cancer screening at various ages. This may include screening for:  Colorectal cancer.  Prostate cancer.  Skin cancer.  Lung  cancer.  What should I know about heart disease, diabetes, and high blood pressure?  Blood pressure and heart disease  High blood pressure causes heart disease and increases the risk of stroke. This is more likely to develop in people who have high blood pressure readings or are overweight.  Talk with your health care provider about your target blood pressure readings.  Have your blood pressure checked:  Every 3-5 years if you are 24-52 years of age.  Every year if you are 3 years old or older.  If you are between the ages of 60 and 72 and are a current or former smoker, ask your health care provider if you should have a one-time screening for abdominal aortic aneurysm (AAA).  Diabetes  Have regular diabetes screenings. This checks your fasting blood sugar level. Have the screening done:  Once every three years after age 66 if you are at a normal weight and have a low risk for diabetes.  More often and at a younger age if you are overweight or have a high risk for diabetes.  What should I know about preventing infection?  Hepatitis B  If you have a higher risk for hepatitis B, you should be screened for this virus. Talk with your health care provider to find out if you are at risk for hepatitis B infection.  Hepatitis C  Blood testing is recommended for:  Everyone born from [birth year] through 1963.  Anyone  with known risk factors for hepatitis C.  Sexually transmitted infections (STIs)  You should be screened each year for STIs, including gonorrhea and chlamydia, if:  You are sexually active and are younger than 76 years of age.  You are older than 76 years of age and your health care provider tells you that you are at risk for this type of infection.  Your sexual activity has changed since you were last screened, and you are at increased risk for chlamydia or gonorrhea. Ask your health care provider if you are at risk.  Ask your health care provider about whether you are at high risk for HIV. Your health care provider  may recommend a prescription medicine to help prevent HIV infection. If you choose to take medicine to prevent HIV, you should first get tested for HIV. You should then be tested every 3 months for as long as you are taking the medicine.  Follow these instructions at home:  Alcohol use  Do not drink alcohol if your health care provider tells you not to drink.  If you drink alcohol:  Limit how much you have to 0-2 drinks a day.  Know how much alcohol is in your drink. In the [place] one drink equals one 12 oz bottle of beer (355 mL), one 5 oz glass of wine (148 mL), or one 1 oz glass of hard liquor (44 mL).  Lifestyle  Do not use any products that contain nicotine or tobacco. These products include cigarettes, chewing tobacco, and vaping devices, such as e-cigarettes. If you need help quitting, ask your health care provider.  Do not use street drugs.  Do not share needles.  Ask your health care provider for help if you need support or information about quitting drugs.  General instructions  Schedule regular health, dental, and eye exams.  Stay current with your vaccines.  Tell your health care provider if:  You often feel depressed.  You have ever been abused or do not feel safe at home.  Summary  Adopting a healthy lifestyle and getting preventive care are important in promoting health and wellness.  Follow your health care provider's instructions about healthy diet, exercising, and getting tested or screened for diseases.  Follow your health care provider's instructions on monitoring your cholesterol and blood pressure.  This information is not intended to replace advice given to you by your health care provider. Make sure you discuss any questions you have with your health care provider.  Document Revised: 08/23/2020 Document Reviewed: 08/23/2020  Elsevier Patient Education  2024 [employer].

## 2023-12-05 NOTE — Assessment & Plan Note (Signed)
 Much improved.  Not on any medications at present time

## 2023-12-05 NOTE — Assessment & Plan Note (Signed)
Clinically euthyroid.  TSH done today Not taking Synthroid

## 2023-12-05 NOTE — Progress Notes (Signed)
 Henry [name] 78 y.o.   Chief Complaint  Patient presents with   Annual Exam    Patient here for yearly check up. Patient feels like is has loss some hearing in his right ear and would like a referral soon.     HISTORY OF PRESENT ILLNESS: This is a 77 y.o. male here for annual exam. Overall doing well Decreased hearing on right ear.  Needs referral  HPI   Prior to Admission medications   Medication Sig Start Date End Date Taking? Authorizing Provider  benazepril -hydrochlorthiazide (LOTENSIN  HCT) 20-25 MG tablet TAKE 1 TABLET BY MOUTH EVERY DAY 03/18/23  Yes Nastacia Raybuck, Emil Schanz, MD  cholecalciferol (VITAMIN D) 1000 UNITS tablet Take 1,000 Units by mouth daily.   Yes [provider]  fish oil-omega-3 fatty acids 1000 MG capsule Take 2 g by mouth daily.   Yes [provider]  ibuprofen (ADVIL) 200 MG tablet Take 200 mg by mouth every 6 (six) hours as needed for moderate pain (pain score 4-6), fever or mild pain (pain score 1-3).   Yes [provider]  Loperamide  HCl (IMODIUM  PO) Take 1-2 tablets by mouth as needed.   Yes [provider]  LORazepam  (ATIVAN ) 1 MG tablet TAKE 1 TABLET BY MOUTH EVERY DAY AS NEEDED FOR ANXIETY 07/12/23  Yes Elisa Sorlie, Emil Schanz, MD  tamsulosin  (FLOMAX ) 0.4 MG CAPS capsule Take 0.4 mg by mouth daily after breakfast.   Yes [provider]  zolpidem  (AMBIEN ) 10 MG tablet Take 1 tablet (10 mg total) by mouth at bedtime as needed. for sleep 08/17/23  Yes Jathniel Smeltzer, Emil Schanz, MD  acetaminophen  (TYLENOL ) 650 MG CR tablet Take 325-600 mg by mouth every 8 (eight) hours as needed for pain.    [provider]  azithromycin  (ZITHROMAX ) 250 MG tablet Sig as indicated Patient not taking: Reported on 12/05/2023 09/13/23   Tuan Tippin Jose, MD  diclofenac  (VOLTAREN ) 75 MG EC tablet Take 1 tablet (75 mg total) by mouth 2 (two) times daily. Patient not taking: Reported on 12/05/2023 08/28/23   Purcell Emil Schanz,  MD  diclofenac  Sodium (VOLTAREN ) 1 % GEL Apply 2 g topically 4 (four) times daily. Patient not taking: Reported on 12/05/2023 08/28/23   Purcell Emil Schanz, MD  levothyroxine  (SYNTHROID ) 125 MCG tablet TAKE 1 TABLET BY MOUTH EVERY DAY BEFORE BREAKFAST Patient not taking: Reported on 12/05/2023 03/29/22   Purcell Emil Schanz, MD    Allergies  Allergen Reactions   Ciprofloxacin Itching and Rash    Patient Active Problem List   Diagnosis Date Noted   Tiredness 09/18/2023   Enlarged prostate 09/18/2023   Persistent cough 09/13/2023   Arthralgia of multiple joints 08/28/2023   Adjustment reaction with anxiety and depression 07/17/2023   Current moderate episode of major depressive disorder without prior episode (HCC) 09/07/2021   Iron deficiency anemia due to chronic blood loss 09/07/2016   Hypothyroidism due to acquired atrophy of thyroid  08/15/2016   Degenerative disc disease, lumbar 03/16/2014   Insomnia 03/16/2014   Chronic pain syndrome 03/16/2014   Hypothyroidism 05/02/2012   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   Essential hypertension 07/19/2007    Past Medical History:  Diagnosis Date   Anxiety    Arthritis    Degenerative disc disease, lumbar    Hypertension    Hypothyroidism    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   [facility] SCALP- [place] MOHS    Past Surgical  History:  Procedure Laterality Date   CHOLECYSTECTOMY     COLON SURGERY  08/07/2006   colostomy & small bowel resection   FRACTURE SURGERY     left shoulder   SPINE SURGERY     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    Social History   Socioeconomic History   Marital status: Married    Spouse name: Not on file   Number of children: Not on file   Years of education: Not on file   Highest education level: Not on file  Occupational History   Not on file  Tobacco Use   Smoking status: Never    Passive exposure:  Never   Smokeless tobacco: Never  Vaping Use   Vaping status: Not on file  Substance and Sexual Activity   Alcohol use: Yes   Drug use: Never   Sexual activity: Yes  Other Topics Concern   Not on file  Social History Narrative   Not on file   Social Drivers of Health   Financial Resource Strain: High Risk (09/11/2023)   Received from [facility]   Overall Financial Resource Strain (CARDIA)    Difficulty of Paying Living Expenses: Hard  Food Insecurity: Unknown (09/11/2023)   Received from [facility]   Hunger Vital Sign    Within the past 12 months, you worried that your food would run out before you got the money to buy more.: Patient declined    Within the past 12 months, the food you bought just didn't last and you didn't have money to get more.: Never true  Transportation Needs: No Transportation Needs (09/11/2023)   Received from [facility] - Transportation    In the past 12 months, has lack of transportation kept you from medical appointments or from getting medications?: No    Lack of Transportation (Non-Medical): No  Physical Activity: Inactive (07/17/2023)   Exercise Vital Sign    Days of Exercise per Week: 0 days    Minutes of Exercise per Session: 0 min  Stress: Stress Concern Present (07/17/2023)   [employer] [facility] [employer] - Occupational Stress Questionnaire    Feeling of Stress : Very much  Social Connections: Socially Integrated (07/29/2023)   Social Connection and Isolation Panel    Frequency of Communication with Friends and Family: More than three times a week    Frequency of Social Gatherings with Friends and Family: More than three times a week    Attends Religious Services: More than 4 times per year    Active Member of [employer] or Organizations: Yes    Attends Banker Meetings: 1 to 4 times per year    Marital Status: Married  Catering manager Violence: Not At Risk  (07/29/2023)   Humiliation, Afraid, Rape, and Kick questionnaire    Fear of Current or Ex-Partner: No    Emotionally Abused: No    Physically Abused: No    Sexually Abused: No    Family History  Problem Relation Age of Onset   Heart disease Mother        CAD and CHF     Review of Systems  Constitutional: Negative.  Negative for chills and fever.  HENT:  Positive for hearing loss. Negative for congestion and sore throat.   Respiratory: Negative.  Negative for cough and shortness of breath.   Cardiovascular: Negative.  Negative for chest pain and palpitations.  Gastrointestinal:  Negative for abdominal pain, diarrhea, nausea  and vomiting.  Genitourinary: Negative.  Negative for dysuria and hematuria.  Musculoskeletal:  Positive for back pain and joint pain.  Skin: Negative.  Negative for rash.  Neurological: Negative.  Negative for dizziness and headaches.  All other systems reviewed and are negative.   Vitals:   12/05/23 0944  BP: 122/82  Pulse: 77  Temp: (!) 97.5 F (36.4 C)  SpO2: 97%    Physical Exam Vitals reviewed.  Constitutional:      Appearance: Normal appearance.  HENT:     Head: Normocephalic.     Right Ear: Tympanic membrane, ear canal and external ear normal.     Left Ear: Tympanic membrane, ear canal and external ear normal.     Mouth/Throat:     Mouth: Mucous membranes are moist.     Pharynx: Oropharynx is clear.  Eyes:     Extraocular Movements: Extraocular movements intact.     Conjunctiva/sclera: Conjunctivae normal.     Pupils: Pupils are equal, round, and reactive to light.  Cardiovascular:     Rate and Rhythm: Normal rate and regular rhythm.     Pulses: Normal pulses.     Heart sounds: Normal heart sounds.  Pulmonary:     Effort: Pulmonary effort is normal.     Breath sounds: Normal breath sounds.  Abdominal:     Palpations: Abdomen is soft.     Tenderness: There is no abdominal tenderness.  Musculoskeletal:     Cervical back: No  tenderness.  Lymphadenopathy:     Cervical: No cervical adenopathy.  Skin:    General: Skin is warm and dry.     Capillary Refill: Capillary refill takes less than 2 seconds.  Neurological:     General: No focal deficit present.     Mental Status: He is alert and oriented to person, place, and time.  Psychiatric:        Mood and Affect: Mood normal.        Behavior: Behavior normal.      ASSESSMENT & PLAN: Problem List Items Addressed This Visit       Cardiovascular and Mediastinum   Essential hypertension   BP Readings from Last 3 Encounters:  12/05/23 122/82  09/18/23 128/74  09/13/23 114/78  Well-controlled hypertension Continues Lotensin  HCT 20-25 mg daily Cardiovascular risks associated with hypertension discussed       Relevant Orders   CBC with Differential/Platelet   Comprehensive metabolic panel with GFR     Endocrine   Hypothyroidism   Clinically euthyroid TSH done today Not taking Synthroid       Relevant Orders   TSH     Nervous and Auditory   Sensorineural hearing loss (SNHL) of right ear with restricted hearing of left ear   Relevant Orders   Ambulatory referral to Audiology     Other   Insomnia   Well-controlled on Ambien . Has been taking Ambien  for many years Recommend to continue       Current moderate episode of major depressive disorder without prior episode (HCC)   Much improved.  Not on any medications at present time      Arthralgia of multiple joints   Well-controlled. Pain management discussed      Relevant Orders   CBC with Differential/Platelet   Enlarged prostate   Well-controlled symptoms on tamsulosin  0.4 mg daily      Relevant Orders   PSA   Other Visit Diagnoses       Encounter for general adult medical examination with abnormal  findings    -  Primary   Relevant Orders   CBC with Differential/Platelet   Comprehensive metabolic panel with GFR   Hemoglobin A1c   Lipid panel   PSA   TSH     Screening for  deficiency anemia       Relevant Orders   CBC with Differential/Platelet     Screening for lipoid disorders       Relevant Orders   Lipid panel     Screening for endocrine, metabolic and immunity disorder       Relevant Orders   Comprehensive metabolic panel with GFR   Hemoglobin A1c      Modifiable risk factors discussed with patient. Anticipatory guidance according to age provided. The following topics were also discussed: Social Determinants of Health Smoking.  Non-smoker Diet and nutrition Benefits of exercise Cancer screening and review of most recent colonoscopy report Vaccinations review and recommendations Cardiovascular risk assessment Review of multiple chronic medical conditions under management Review of all medications Mental health including depression and anxiety Fall and accident prevention  Patient Instructions  Health Maintenance, Male Adopting a healthy lifestyle and getting preventive care are important in promoting health and wellness. Ask your health care provider about: The right schedule for you to have regular tests and exams. Things you can do on your own to prevent diseases and keep yourself healthy. What should I know about diet, weight, and exercise? Eat a healthy diet  Eat a diet that includes plenty of vegetables, fruits, low-fat dairy products, and lean protein. Do not eat a lot of foods that are high in solid fats, added sugars, or sodium. Maintain a healthy weight Body mass index (BMI) is a measurement that can be used to identify possible weight problems. It estimates body fat based on height and weight. Your health care provider can help determine your BMI and help you achieve or maintain a healthy weight. Get regular exercise Get regular exercise. This is one of the most important things you can do for your health. Most adults should: Exercise for at least 150 minutes each week. The exercise should increase your heart rate and make you  sweat (moderate-intensity exercise). Do strengthening exercises at least twice a week. This is in addition to the moderate-intensity exercise. Spend less time sitting. Even light physical activity can be beneficial. Watch cholesterol and blood lipids Have your blood tested for lipids and cholesterol at 76 years of age, then have this test every 5 years. You may need to have your cholesterol levels checked more often if: Your lipid or cholesterol levels are high. You are older than 76 years of age. You are at high risk for heart disease. What should I know about cancer screening? Many types of cancers can be detected early and may often be prevented. Depending on your health history and family history, you may need to have cancer screening at various ages. This may include screening for: Colorectal cancer. Prostate cancer. Skin cancer. Lung cancer. What should I know about heart disease, diabetes, and high blood pressure? Blood pressure and heart disease High blood pressure causes heart disease and increases the risk of stroke. This is more likely to develop in people who have high blood pressure readings or are overweight. Talk with your health care provider about your target blood pressure readings. Have your blood pressure checked: Every 3-5 years if you are 76-48 years of age. Every year if you are 30 years old or older. If you are between the  ages of 55 and 66 and are a current or former smoker, ask your health care provider if you should have a one-time screening for abdominal aortic aneurysm (AAA). Diabetes Have regular diabetes screenings. This checks your fasting blood sugar level. Have the screening done: Once every three years after age 66 if you are at a normal weight and have a low risk for diabetes. More often and at a younger age if you are overweight or have a high risk for diabetes. What should I know about preventing infection? Hepatitis B If you have a higher risk for  hepatitis B, you should be screened for this virus. Talk with your health care provider to find out if you are at risk for hepatitis B infection. Hepatitis C Blood testing is recommended for: Everyone born from [birth year] through 1963. Anyone with known risk factors for hepatitis C. Sexually transmitted infections (STIs) You should be screened each year for STIs, including gonorrhea and chlamydia, if: You are sexually active and are younger than 76 years of age. You are older than 76 years of age and your health care provider tells you that you are at risk for this type of infection. Your sexual activity has changed since you were last screened, and you are at increased risk for chlamydia or gonorrhea. Ask your health care provider if you are at risk. Ask your health care provider about whether you are at high risk for HIV. Your health care provider may recommend a prescription medicine to help prevent HIV infection. If you choose to take medicine to prevent HIV, you should first get tested for HIV. You should then be tested every 3 months for as long as you are taking the medicine. Follow these instructions at home: Alcohol use Do not drink alcohol if your health care provider tells you not to drink. If you drink alcohol: Limit how much you have to 0-2 drinks a day. Know how much alcohol is in your drink. In the [place] one drink equals one 12 oz bottle of beer (355 mL), one 5 oz glass of wine (148 mL), or one 1 oz glass of hard liquor (44 mL). Lifestyle Do not use any products that contain nicotine or tobacco. These products include cigarettes, chewing tobacco, and vaping devices, such as e-cigarettes. If you need help quitting, ask your health care provider. Do not use street drugs. Do not share needles. Ask your health care provider for help if you need support or information about quitting drugs. General instructions Schedule regular health, dental, and eye exams. Stay current with your  vaccines. Tell your health care provider if: You often feel depressed. You have ever been abused or do not feel safe at home. Summary Adopting a healthy lifestyle and getting preventive care are important in promoting health and wellness. Follow your health care provider's instructions about healthy diet, exercising, and getting tested or screened for diseases. Follow your health care provider's instructions on monitoring your cholesterol and blood pressure. This information is not intended to replace advice given to you by your health care provider. Make sure you discuss any questions you have with your health care provider. Document Revised: 08/23/2020 Document Reviewed: 08/23/2020 Elsevier Patient Education  2024 Elsevier Inc.     Emil Schaumann, MD [facility]

## 2023-12-05 NOTE — Assessment & Plan Note (Signed)
 Well-controlled on Ambien . Has been taking Ambien  for many years Recommend to continue

## 2023-12-05 NOTE — Assessment & Plan Note (Signed)
 BP Readings from Last 3 Encounters:  12/05/23 122/82  09/18/23 128/74  09/13/23 114/78  Well-controlled hypertension Continues Lotensin  HCT 20-25 mg daily Cardiovascular risks associated with hypertension discussed

## 2023-12-06 ENCOUNTER — Other Ambulatory Visit: Payer: Self-pay | Admitting: Emergency Medicine

## 2023-12-06 DIAGNOSIS — F5104 Psychophysiologic insomnia: Secondary | ICD-10-CM

## 2023-12-12 ENCOUNTER — Telehealth: Payer: Self-pay

## 2023-12-12 NOTE — Telephone Encounter (Signed)
 Copied from CRM #[id number]. Topic: General - Other >> Dec 12, 2023  2:49 PM Pinkey ORN wrote: Reason for CRM: Requesting Office Call Back >> Dec 12, 2023  2:53 PM Pinkey ORN wrote: Patient states he was in last week and request his medications be sent to the [facility]. Patient states he's needing prior authorization for his sleep medication, patient is requesting a follow up / call back in regards to this. Patient number is [phone]. Patient's email address is [email or web address]

## 2023-12-14 ENCOUNTER — Other Ambulatory Visit (HOSPITAL_COMMUNITY): Payer: Self-pay

## 2023-12-14 ENCOUNTER — Telehealth: Payer: Self-pay

## 2023-12-14 NOTE — Telephone Encounter (Signed)
 Pharmacy Patient Advocate Encounter   Received notification from Pt Calls Messages that prior authorization for Zolpidem  10mg   is required/requested.   Insurance verification completed.   The patient is insured through [facility] .   Per test claim: PA required; PA submitted to above mentioned insurance via Latent Key/confirmation #/EOC [id number] Status is pending

## 2023-12-16 ENCOUNTER — Other Ambulatory Visit: Payer: Self-pay | Admitting: Emergency Medicine

## 2023-12-16 DIAGNOSIS — M542 Cervicalgia: Secondary | ICD-10-CM

## 2023-12-18 ENCOUNTER — Other Ambulatory Visit (HOSPITAL_COMMUNITY): Payer: Self-pay

## 2023-12-18 ENCOUNTER — Other Ambulatory Visit: Payer: Self-pay | Admitting: Emergency Medicine

## 2023-12-18 DIAGNOSIS — F411 Generalized anxiety disorder: Secondary | ICD-10-CM

## 2023-12-18 NOTE — Telephone Encounter (Signed)
 Pharmacy Patient Advocate Encounter  Received notification from [facility] that Prior Authorization for Zolpidem  10mg  tabs has been APPROVED from 11/16/23 to 04/16/24   PA #/Case ID/Reference #: [id number]  Left a message at [facility] to notify of the approval

## 2024-01-24 ENCOUNTER — Encounter: Payer: Self-pay | Admitting: Emergency Medicine

## 2024-01-24 ENCOUNTER — Ambulatory Visit (INDEPENDENT_AMBULATORY_CARE_PROVIDER_SITE_OTHER): Admitting: Emergency Medicine

## 2024-01-24 VITALS — BP 126/82 | HR 72 | Temp 97.5°F | Ht 64.0 in | Wt 182.8 lb

## 2024-01-24 DIAGNOSIS — M2550 Pain in unspecified joint: Secondary | ICD-10-CM | POA: Diagnosis not present

## 2024-01-24 DIAGNOSIS — I10 Essential (primary) hypertension: Secondary | ICD-10-CM | POA: Diagnosis not present

## 2024-01-24 DIAGNOSIS — E034 Atrophy of thyroid (acquired): Secondary | ICD-10-CM

## 2024-01-24 DIAGNOSIS — F5104 Psychophysiologic insomnia: Secondary | ICD-10-CM

## 2024-01-24 DIAGNOSIS — G894 Chronic pain syndrome: Secondary | ICD-10-CM

## 2024-01-24 DIAGNOSIS — E039 Hypothyroidism, unspecified: Secondary | ICD-10-CM

## 2024-01-24 DIAGNOSIS — F321 Major depressive disorder, single episode, moderate: Secondary | ICD-10-CM | POA: Diagnosis not present

## 2024-01-24 DIAGNOSIS — H43393 Other vitreous opacities, bilateral: Secondary | ICD-10-CM | POA: Diagnosis not present

## 2024-01-24 DIAGNOSIS — H524 Presbyopia: Secondary | ICD-10-CM | POA: Diagnosis not present

## 2024-01-24 NOTE — Progress Notes (Signed)
 Henry [name] 78 y.o.   Chief Complaint  Patient presents with   Medication Problem    Medications. New finger cramping    HISTORY OF PRESENT ILLNESS: This is a 78 y.o. male here for follow-up of multiple chronic medical conditions Overall doing well. Occasional cramping in both hands No other complaints or medical concerns today.  HPI   Prior to Admission medications   Medication Sig Start Date End Date Taking? Authorizing Provider  acetaminophen  (TYLENOL ) 650 MG CR tablet Take 325-600 mg by mouth every 8 (eight) hours as needed for pain.   Yes [provider]  benazepril -hydrochlorthiazide (LOTENSIN  HCT) 20-25 MG tablet TAKE 1 TABLET BY MOUTH EVERY DAY 03/18/23  Yes Nimco Bivens, Emil Schanz, MD  cholecalciferol (VITAMIN D) 1000 UNITS tablet Take 1,000 Units by mouth daily.   Yes [provider]  fish oil-omega-3 fatty acids 1000 MG capsule Take 2 g by mouth daily.   Yes [provider]  ibuprofen (ADVIL) 200 MG tablet Take 200 mg by mouth every 6 (six) hours as needed for moderate pain (pain score 4-6), fever or mild pain (pain score 1-3).   Yes [provider]  lidocaine  (LIDODERM ) 5 % PLACE 1 PATCH ONTO THE SKIN DAILY. REMOVE & DISCARD PATCH WITHIN 12 HOURS OR AS DIRECTED BY MD - PA DENIED 12/17/23  Yes Lamarius Dirr, Emil Schanz, MD  LORazepam  (ATIVAN ) 1 MG tablet TAKE 1 TABLET BY MOUTH EVERY DAY AS NEEDED FOR ANXIETY 12/18/23  Yes Shavar Gorka, Emil Schanz, MD  tamsulosin  (FLOMAX ) 0.4 MG CAPS capsule Take 0.4 mg by mouth daily after breakfast.   Yes [provider]  zolpidem  (AMBIEN ) 10 MG tablet TAKE 1 TABLET (10 MG TOTAL) BY MOUTH AT BEDTIME AS NEEDED FOR SLEEP 12/07/23  Yes Krystle Oberman Jose, MD  levothyroxine  (SYNTHROID ) 125 MCG tablet Take 1 tablet (125 mcg total) by mouth daily before breakfast. Patient not taking: Reported on 01/24/2024 12/05/23   Tilton Marsalis Jose, MD  Loperamide  HCl (IMODIUM  PO) Take 1-2 tablets by mouth as  needed. Patient not taking: Reported on 01/24/2024    [provider]  rosuvastatin  (CRESTOR ) 10 MG tablet Take 1 tablet (10 mg total) by mouth daily. Patient not taking: Reported on 01/24/2024 12/05/23   Purcell Emil Schanz, MD    Allergies  Allergen Reactions   Ciprofloxacin Itching and Rash    Patient Active Problem List   Diagnosis Date Noted   Sensorineural hearing loss (SNHL) of right ear with restricted hearing of left ear 12/05/2023   Enlarged prostate 09/18/2023   Arthralgia of multiple joints 08/28/2023   Adjustment reaction with anxiety and depression 07/17/2023   Current moderate episode of major depressive disorder without prior episode (HCC) 09/07/2021   Iron deficiency anemia due to chronic blood loss 09/07/2016   Hypothyroidism due to acquired atrophy of thyroid  08/15/2016   Degenerative disc disease, lumbar 03/16/2014   Insomnia 03/16/2014   Chronic pain syndrome 03/16/2014   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   Essential hypertension 07/19/2007    Past Medical History:  Diagnosis Date   Anxiety    Arthritis    Degenerative disc disease, lumbar    Hypertension    Hypothyroidism    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   CRWON SCALP- [place] MOHS    Past Surgical History:  Procedure Laterality Date   CHOLECYSTECTOMY     COLON SURGERY  08/07/2006   colostomy & small bowel  resection   FRACTURE SURGERY     left shoulder   SPINE SURGERY     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    Social History   Socioeconomic History   Marital status: Married    Spouse name: Not on file   Number of children: Not on file   Years of education: Not on file   Highest education level: Not on file  Occupational History   Not on file  Tobacco Use   Smoking status: Never    Passive exposure: Never   Smokeless tobacco: Never  Vaping Use   Vaping status: Not on file   Substance and Sexual Activity   Alcohol use: Yes   Drug use: Never   Sexual activity: Yes  Other Topics Concern   Not on file  Social History Narrative   Not on file   Social Drivers of Health   Financial Resource Strain: High Risk (09/11/2023)   Received from [facility]   Overall Financial Resource Strain (CARDIA)    Difficulty of Paying Living Expenses: Hard  Food Insecurity: Unknown (09/11/2023)   Received from [facility]   Hunger Vital Sign    Within the past 12 months, you worried that your food would run out before you got the money to buy more.: Patient declined    Within the past 12 months, the food you bought just didn't last and you didn't have money to get more.: Never true  Transportation Needs: No Transportation Needs (09/11/2023)   Received from [facility] - Transportation    In the past 12 months, has lack of transportation kept you from medical appointments or from getting medications?: No    Lack of Transportation (Non-Medical): No  Physical Activity: Inactive (07/17/2023)   Exercise Vital Sign    Days of Exercise per Week: 0 days    Minutes of Exercise per Session: 0 min  Stress: Stress Concern Present (07/17/2023)   [employer] of Occupational Health - Occupational Stress Questionnaire    Feeling of Stress : Very much  Social Connections: Socially Integrated (07/29/2023)   Social Connection and Isolation Panel    Frequency of Communication with Friends and Family: More than three times a week    Frequency of Social Gatherings with Friends and Family: More than three times a week    Attends Religious Services: More than 4 times per year    Active Member of [employer] or Organizations: Yes    Attends Banker Meetings: 1 to 4 times per year    Marital Status: Married  Catering manager Violence: Not At Risk (07/29/2023)   Humiliation, Afraid, Rape, and Kick questionnaire    Fear of  Current or Ex-Partner: No    Emotionally Abused: No    Physically Abused: No    Sexually Abused: No    Family History  Problem Relation Age of Onset   Heart disease Mother        CAD and CHF     Review of Systems  Constitutional: Negative.  Negative for chills and fever.  HENT: Negative.  Negative for congestion and sore throat.   Respiratory: Negative.  Negative for cough and shortness of breath.   Cardiovascular: Negative.  Negative for chest pain and palpitations.  Gastrointestinal:  Negative for abdominal pain, nausea and vomiting.  Genitourinary: Negative.  Negative for dysuria and hematuria.  Musculoskeletal:  Positive for back pain and joint pain.  Skin: Negative.  Negative  for rash.  Neurological: Negative.  Negative for dizziness and headaches.  Psychiatric/Behavioral:  The patient has insomnia.   All other systems reviewed and are negative.   Vitals:   01/24/24 1012  BP: 126/82  Pulse: 72  Temp: (!) 97.5 F (36.4 C)  SpO2: 98%    Physical Exam Vitals reviewed.  Constitutional:      Appearance: Normal appearance.  HENT:     Head: Normocephalic.     Mouth/Throat:     Mouth: Mucous membranes are moist.     Pharynx: Oropharynx is clear.  Eyes:     Extraocular Movements: Extraocular movements intact.     Pupils: Pupils are equal, round, and reactive to light.  Cardiovascular:     Rate and Rhythm: Normal rate and regular rhythm.     Pulses: Normal pulses.     Heart sounds: Normal heart sounds.  Pulmonary:     Effort: Pulmonary effort is normal.     Breath sounds: Normal breath sounds.  Abdominal:     Palpations: Abdomen is soft.     Tenderness: There is no abdominal tenderness.  Musculoskeletal:     Cervical back: No tenderness.  Lymphadenopathy:     Cervical: No cervical adenopathy.  Skin:    General: Skin is warm and dry.     Capillary Refill: Capillary refill takes less than 2 seconds.  Neurological:     General: No focal deficit present.      Mental Status: He is alert and oriented to person, place, and time.  Psychiatric:        Mood and Affect: Mood normal.        Behavior: Behavior normal.      ASSESSMENT & PLAN: A total of 42 minutes was spent with the patient and counseling/coordination of care regarding preparing for this visit, review of most recent office visit notes, review of multiple chronic medical conditions and their management, cardiovascular risks associated with hypertension, diagnosis and management of chronic insomnia, review of all medications, review of most recent bloodwork results, review of health maintenance items, education on nutrition, prognosis, documentation, and need for follow up.   Problem List Items Addressed This Visit       Cardiovascular and Mediastinum   Essential hypertension - Primary   BP Readings from Last 3 Encounters:  01/24/24 126/82  12/05/23 122/82  09/18/23 128/74  Well-controlled hypertension Continues Lotensin  HCT 20-25 mg daily Cardiovascular risks associated with hypertension discussed          Endocrine   Hypothyroidism due to acquired atrophy of thyroid    Clinically euthyroid Continue levothyroxine  125 mcg daily      RESOLVED: Hypothyroidism     Other   Insomnia   Well-controlled on Ambien . Has been taking Ambien  for many years Recommend to continue      Chronic pain syndrome   Was able to establish care with local pain management doctor recently. However treatment with opiates failed.  Told not much else they can do for him.  Was advised to follow-up with his PCP.      Current moderate episode of major depressive disorder without prior episode (HCC)   Much improved.  Not on any medications at present time      Arthralgia of multiple joints   Well-controlled. Pain management discussed      Patient Instructions  Health Maintenance After Age 37 After age 75, you are at a higher risk for certain long-term diseases and infections as well as  injuries from  falls. Falls are a major cause of broken bones and head injuries in people who are older than age 41. Getting regular preventive care can help to keep you healthy and well. Preventive care includes getting regular testing and making lifestyle changes as recommended by your health care provider. Talk with your health care provider about: Which screenings and tests you should have. A screening is a test that checks for a disease when you have no symptoms. A diet and exercise plan that is right for you. What should I know about screenings and tests to prevent falls? Screening and testing are the best ways to find a health problem early. Early diagnosis and treatment give you the best chance of managing medical conditions that are common after age 48. Certain conditions and lifestyle choices may make you more likely to have a fall. Your health care provider may recommend: Regular vision checks. Poor vision and conditions such as cataracts can make you more likely to have a fall. If you wear glasses, make sure to get your prescription updated if your vision changes. Medicine review. Work with your health care provider to regularly review all of the medicines you are taking, including over-the-counter medicines. Ask your health care provider about any side effects that may make you more likely to have a fall. Tell your health care provider if any medicines that you take make you feel dizzy or sleepy. Strength and balance checks. Your health care provider may recommend certain tests to check your strength and balance while standing, walking, or changing positions. Foot health exam. Foot pain and numbness, as well as not wearing proper footwear, can make you more likely to have a fall. Screenings, including: Osteoporosis screening. Osteoporosis is a condition that causes the bones to get weaker and break more easily. Blood pressure screening. Blood pressure changes and medicines to control blood  pressure can make you feel dizzy. Depression screening. You may be more likely to have a fall if you have a fear of falling, feel depressed, or feel unable to do activities that you used to do. Alcohol use screening. Using too much alcohol can affect your balance and may make you more likely to have a fall. Follow these instructions at home: Lifestyle Do not drink alcohol if: Your health care provider tells you not to drink. If you drink alcohol: Limit how much you have to: 0-1 drink a day for women. 0-2 drinks a day for men. Know how much alcohol is in your drink. In the [place] one drink equals one 12 oz bottle of beer (355 mL), one 5 oz glass of wine (148 mL), or one 1 oz glass of hard liquor (44 mL). Do not use any products that contain nicotine or tobacco. These products include cigarettes, chewing tobacco, and vaping devices, such as e-cigarettes. If you need help quitting, ask your health care provider. Activity  Follow a regular exercise program to stay fit. This will help you maintain your balance. Ask your health care provider what types of exercise are appropriate for you. If you need a cane or walker, use it as recommended by your health care provider. Wear supportive shoes that have nonskid soles. Safety  Remove any tripping hazards, such as rugs, cords, and clutter. Install safety equipment such as grab bars in bathrooms and safety rails on stairs. Keep rooms and walkways well-lit. General instructions Talk with your health care provider about your risks for falling. Tell your health care provider if: You fall. Be sure to  tell your health care provider about all falls, even ones that seem minor. You feel dizzy, tiredness (fatigue), or off-balance. Take over-the-counter and prescription medicines only as told by your health care provider. These include supplements. Eat a healthy diet and maintain a healthy weight. A healthy diet includes low-fat dairy products, low-fat (lean)  meats, and fiber from whole grains, beans, and lots of fruits and vegetables. Stay current with your vaccines. Schedule regular health, dental, and eye exams. Summary Having a healthy lifestyle and getting preventive care can help to protect your health and wellness after age 8. Screening and testing are the best way to find a health problem early and help you avoid having a fall. Early diagnosis and treatment give you the best chance for managing medical conditions that are more common for people who are older than age 60. Falls are a major cause of broken bones and head injuries in people who are older than age 22. Take precautions to prevent a fall at home. Work with your health care provider to learn what changes you can make to improve your health and wellness and to prevent falls. This information is not intended to replace advice given to you by your health care provider. Make sure you discuss any questions you have with your health care provider. Document Revised: 08/23/2020 Document Reviewed: 08/23/2020 Elsevier Patient Education  2024 Elsevier Inc.     Emil Schaumann, MD [facility]

## 2024-01-24 NOTE — Patient Instructions (Signed)
 Health Maintenance After Age 76 After age 27, you are at a higher risk for certain long-term diseases and infections as well as injuries from falls. Falls are a major cause of broken bones and head injuries in people who are older than age 73. Getting regular preventive care can help to keep you healthy and well. Preventive care includes getting regular testing and making lifestyle changes as recommended by your health care provider. Talk with your health care provider about: Which screenings and tests you should have. A screening is a test that checks for a disease when you have no symptoms. A diet and exercise plan that is right for you. What should I know about screenings and tests to prevent falls? Screening and testing are the best ways to find a health problem early. Early diagnosis and treatment give you the best chance of managing medical conditions that are common after age 90. Certain conditions and lifestyle choices may make you more likely to have a fall. Your health care provider may recommend: Regular vision checks. Poor vision and conditions such as cataracts can make you more likely to have a fall. If you wear glasses, make sure to get your prescription updated if your vision changes. Medicine review. Work with your health care provider to regularly review all of the medicines you are taking, including over-the-counter medicines. Ask your health care provider about any side effects that may make you more likely to have a fall. Tell your health care provider if any medicines that you take make you feel dizzy or sleepy. Strength and balance checks. Your health care provider may recommend certain tests to check your strength and balance while standing, walking, or changing positions. Foot health exam. Foot pain and numbness, as well as not wearing proper footwear, can make you more likely to have a fall. Screenings, including: Osteoporosis screening. Osteoporosis is a condition that causes  the bones to get weaker and break more easily. Blood pressure screening. Blood pressure changes and medicines to control blood pressure can make you feel dizzy. Depression screening. You may be more likely to have a fall if you have a fear of falling, feel depressed, or feel unable to do activities that you used to do. Alcohol  use screening. Using too much alcohol  can affect your balance and may make you more likely to have a fall. Follow these instructions at home: Lifestyle Do not drink alcohol  if: Your health care provider tells you not to drink. If you drink alcohol : Limit how much you have to: 0-1 drink a day for women. 0-2 drinks a day for men. Know how much alcohol  is in your drink. In the [place] one drink equals one 12 oz bottle of beer (355 mL), one 5 oz glass of wine (148 mL), or one 1 oz glass of hard liquor (44 mL). Do not use any products that contain nicotine or tobacco. These products include cigarettes, chewing tobacco, and vaping devices, such as e-cigarettes. If you need help quitting, ask your health care provider. Activity  Follow a regular exercise program to stay fit. This will help you maintain your balance. Ask your health care provider what types of exercise are appropriate for you. If you need a cane or walker, use it as recommended by your health care provider. Wear supportive shoes that have nonskid soles. Safety  Remove any tripping hazards, such as rugs, cords, and clutter. Install safety equipment such as grab bars in bathrooms and safety rails on stairs. Keep rooms and walkways  well-lit. General instructions Talk with your health care provider about your risks for falling. Tell your health care provider if: You fall. Be sure to tell your health care provider about all falls, even ones that seem minor. You feel dizzy, tiredness (fatigue), or off-balance. Take over-the-counter and prescription medicines only as told by your health care provider. These include  supplements. Eat a healthy diet and maintain a healthy weight. A healthy diet includes low-fat dairy products, low-fat (lean) meats, and fiber from whole grains, beans, and lots of fruits and vegetables. Stay current with your vaccines. Schedule regular health, dental, and eye exams. Summary Having a healthy lifestyle and getting preventive care can help to protect your health and wellness after age 15. Screening and testing are the best way to find a health problem early and help you avoid having a fall. Early diagnosis and treatment give you the best chance for managing medical conditions that are more common for people who are older than age 42. Falls are a major cause of broken bones and head injuries in people who are older than age 64. Take precautions to prevent a fall at home. Work with your health care provider to learn what changes you can make to improve your health and wellness and to prevent falls. This information is not intended to replace advice given to you by your health care provider. Make sure you discuss any questions you have with your health care provider. Document Revised: 08/23/2020 Document Reviewed: 08/23/2020 Elsevier Patient Education  2024 [employer].

## 2024-01-24 NOTE — Assessment & Plan Note (Signed)
Clinically euthyroid.  Continue levothyroxine 125 mcg daily.

## 2024-01-24 NOTE — Assessment & Plan Note (Signed)
 Was able to establish care with local pain management doctor recently. However treatment with opiates failed.  Told not much else they can do for him.  Was advised to follow-up with his PCP.

## 2024-01-24 NOTE — Assessment & Plan Note (Signed)
 Much improved.  Not on any medications at present time

## 2024-01-24 NOTE — Assessment & Plan Note (Signed)
 BP Readings from Last 3 Encounters:  01/24/24 126/82  12/05/23 122/82  09/18/23 128/74  Well-controlled hypertension Continues Lotensin  HCT 20-25 mg daily Cardiovascular risks associated with hypertension discussed

## 2024-01-24 NOTE — Assessment & Plan Note (Signed)
 Well-controlled. Pain management discussed

## 2024-01-24 NOTE — Assessment & Plan Note (Signed)
 Well-controlled on Ambien . Has been taking Ambien  for many years Recommend to continue

## 2024-02-21 ENCOUNTER — Telehealth: Payer: Self-pay

## 2024-02-21 ENCOUNTER — Other Ambulatory Visit: Payer: Self-pay | Admitting: Emergency Medicine

## 2024-02-21 DIAGNOSIS — F5104 Psychophysiologic insomnia: Secondary | ICD-10-CM

## 2024-02-21 NOTE — Telephone Encounter (Signed)
 Copied from CRM #[id number]. Topic: Clinical - Prescription Issue >> Feb 21, 2024  1:52 PM Rea ORN wrote: Reason for CRM: pt is asking that PCP prescribe a Zolpidem  Tartrate 10 mg for 90 day supply. He  stated that every month he has an issue with getting this medication. I advised pt that PCP called in the medication with a 30 day suppy and one refill. I also advised that this medication is a controlled substance and the pharmacy has to have a current rx for it to be dispensed. Pt is looking for a 6 month supply.  Please call back [phone]

## 2024-02-25 NOTE — Telephone Encounter (Signed)
 I believe you are right.  I am not 100% sure.

## 2024-02-25 NOTE — Telephone Encounter (Signed)
 Please advise if I'm correct most Insurance will only cover for a 30 day supply?

## 2024-02-25 NOTE — Telephone Encounter (Signed)
 I called patient and inform him about the control substance refill policy  and he stated that he understands this

## 2024-03-06 DIAGNOSIS — M1712 Unilateral primary osteoarthritis, left knee: Secondary | ICD-10-CM | POA: Diagnosis not present

## 2024-03-18 ENCOUNTER — Telehealth: Payer: Self-pay | Admitting: Emergency Medicine

## 2024-03-18 DIAGNOSIS — F5104 Psychophysiologic insomnia: Secondary | ICD-10-CM

## 2024-03-18 NOTE — Telephone Encounter (Unsigned)
 Copied from CRM #[id number]. Topic: Clinical - Medication Refill >> Mar 18, 2024  1:29 PM Sasha M wrote: Medication: zolpidem  (AMBIEN ) 10 MG tablet  Has the patient contacted their pharmacy? Yes (Agent: If no, request that the patient contact the pharmacy for the refill. If patient does not wish to contact the pharmacy document the reason why and proceed with request.) (Agent: If yes, when and what did the pharmacy advise?) Call provider  This is the patient's preferred pharmacy:  [id number] [place], [place] - [address] AT [address] [place] [address] Phone: [phone] Fax: [phone]  Is this the correct pharmacy for this prescription? Yes If no, delete pharmacy and type the correct one.   Has the prescription been filled recently? No  Is the patient out of the medication? No  Has the patient been seen for an appointment in the last year OR does the patient have an upcoming appointment? Yes  Can we respond through MyChart? Yes  Agent: Please be advised that Rx refills may take up to 3 business days. We ask that you follow-up with your pharmacy.

## 2024-03-19 MED ORDER — ZOLPIDEM TARTRATE 10 MG PO TABS: 10.0000 mg | ORAL_TABLET | Freq: Every evening | ORAL | 1 refills | Status: DC | PRN

## 2024-04-06 ENCOUNTER — Other Ambulatory Visit: Payer: Self-pay | Admitting: Emergency Medicine

## 2024-04-06 DIAGNOSIS — M542 Cervicalgia: Secondary | ICD-10-CM

## 2024-05-02 ENCOUNTER — Ambulatory Visit: Payer: Self-pay

## 2024-05-02 NOTE — Telephone Encounter (Signed)
 FYI Only or Action Required?: Action required by provider: update on patient condition.  Patient was last seen in primary care on 01/24/2024 by Purcell Emil Schanz, MD.  Called Nurse Triage reporting Rash.  Symptoms began several months ago.  Interventions attempted: OTC medications: hydrocortisone.  Symptoms are: unchanged.  Triage Disposition: Information or Advice Only Call  Patient/caregiver understands and will follow disposition?: Yes    Message from Mia F sent at 05/02/2024 12:29 PM EST  Reason for Triage: Rash on back. Has been there for about 3 months and was off and on. Now it is beginning to get worse. Has been using an ointment but it come back.   Reason for Disposition  General information question, no triage required and triager able to answer question  Answer Assessment - Initial Assessment Questions 1. REASON FOR CALL: What is the main reason for your call? or How can I best help you?     Pt and his wife contacted clinic regarding rash. Was told by PAS that pt is confused and thought he was calling his Dermatologist. Wife is aware that she is calling PCP. Spoke to pt and wife, she states rash on back has not been seen by PCP but she would like rash evaluated as it has not improved with OTC hydrocortisone. Pt got very upset, questioning why he needed to update staff on his condition when it should be in records. This RN and his wife tried to explain to pt that he was calling into PCP office, not derm. He states he has PCP appt scheduled and did not need to see PCP at this time. Very upset with wife for calling the wrong number. Wife apologized, reassured her if dermatology unable to see pt to call back for appt with clinic. She voiced appreciation. NO further needs at this time.  Protocols used: Information Only Call - No Triage-A-AH

## 2024-05-05 NOTE — Telephone Encounter (Signed)
 Recommend visit with dermatologist

## 2024-05-05 NOTE — Telephone Encounter (Signed)
 Please advise.

## 2024-05-20 ENCOUNTER — Other Ambulatory Visit: Payer: Self-pay | Admitting: Emergency Medicine

## 2024-05-20 ENCOUNTER — Telehealth: Payer: Self-pay

## 2024-05-20 DIAGNOSIS — F5104 Psychophysiologic insomnia: Secondary | ICD-10-CM

## 2024-05-20 MED ORDER — ZOLPIDEM TARTRATE 10 MG PO TABS: 10.0000 mg | ORAL_TABLET | Freq: Every evening | ORAL | 3 refills | Status: AC | PRN

## 2024-05-20 NOTE — Telephone Encounter (Signed)
 Please send in

## 2024-05-20 NOTE — Telephone Encounter (Signed)
 New prescription sent to pharmacy of record today.  Thanks.

## 2024-05-20 NOTE — Telephone Encounter (Signed)
Called patient and informed him that his prescription has been sent in

## 2024-05-21 ENCOUNTER — Telehealth: Payer: Self-pay

## 2024-05-21 NOTE — Telephone Encounter (Signed)
 Copied from CRM #[id number]. Topic: Clinical - Prescription Issue >> May 21, 2024 10:05 AM Alfonso HERO wrote: Reason for CRM: [facility] told patient he can't pick up Zolpidem  until 06/18/24 but he hasn't gotten it for this month yet. He is asking for someone to call and ask them to have ready for pick up for this month. Patient says he goes through this every month.   [id number] [address] [place] [address] AT [facility] [address] [place] [address] Phone: [phone] Fax: [phone]

## 2024-05-22 NOTE — Telephone Encounter (Signed)
 Spoke with pharmacy and they stated that this medication was filled and was picked up at 11:41am

## 2024-06-10 ENCOUNTER — Ambulatory Visit: Admitting: Emergency Medicine

## 2024-07-24 ENCOUNTER — Ambulatory Visit
